# Patient Record
Sex: Female | Born: 1935 | Race: White | Hispanic: No | Smoking: Former smoker
Health system: Southern US, Community
[De-identification: ages and names within clinical notes are randomized; demographics above are authoritative.]

## PROBLEM LIST (undated history)

## (undated) DIAGNOSIS — D649 Anemia, unspecified: Secondary | ICD-10-CM

## (undated) DIAGNOSIS — I509 Heart failure, unspecified: Secondary | ICD-10-CM

## (undated) DIAGNOSIS — M8580 Other specified disorders of bone density and structure, unspecified site: Secondary | ICD-10-CM

## (undated) DIAGNOSIS — C50919 Malignant neoplasm of unspecified site of unspecified female breast: Secondary | ICD-10-CM

## (undated) DIAGNOSIS — K279 Peptic ulcer, site unspecified, unspecified as acute or chronic, without hemorrhage or perforation: Secondary | ICD-10-CM

## (undated) DIAGNOSIS — N182 Chronic kidney disease, stage 2 (mild): Secondary | ICD-10-CM

## (undated) DIAGNOSIS — K219 Gastro-esophageal reflux disease without esophagitis: Secondary | ICD-10-CM

## (undated) DIAGNOSIS — H409 Unspecified glaucoma: Secondary | ICD-10-CM

## (undated) DIAGNOSIS — C649 Malignant neoplasm of unspecified kidney, except renal pelvis: Secondary | ICD-10-CM

## (undated) DIAGNOSIS — E871 Hypo-osmolality and hyponatremia: Secondary | ICD-10-CM

## (undated) DIAGNOSIS — I10 Essential (primary) hypertension: Secondary | ICD-10-CM

## (undated) DIAGNOSIS — M1990 Unspecified osteoarthritis, unspecified site: Secondary | ICD-10-CM

## (undated) DIAGNOSIS — Z905 Acquired absence of kidney: Secondary | ICD-10-CM

## (undated) HISTORY — PX: HEMIARTHROPLASTY SHOULDER FRACTURE: SUR653

## (undated) HISTORY — PX: BREAST SURGERY: SHX581

## (undated) HISTORY — DX: Heart failure, unspecified: I50.9

## (undated) HISTORY — PX: COLONOSCOPY: SHX174

## (undated) HISTORY — PX: HIP SURGERY: SHX245

## (undated) HISTORY — PX: NEPHRECTOMY: SHX65

---

## 2004-10-23 ENCOUNTER — Ambulatory Visit: Payer: Self-pay | Admitting: Internal Medicine

## 2004-12-27 ENCOUNTER — Ambulatory Visit: Payer: Self-pay | Admitting: Urology

## 2006-01-21 ENCOUNTER — Ambulatory Visit: Payer: Self-pay | Admitting: Internal Medicine

## 2006-07-22 ENCOUNTER — Inpatient Hospital Stay: Payer: Self-pay | Admitting: Internal Medicine

## 2007-01-24 ENCOUNTER — Ambulatory Visit: Payer: Self-pay | Admitting: Internal Medicine

## 2007-06-16 ENCOUNTER — Ambulatory Visit: Payer: Self-pay | Admitting: Urology

## 2008-02-27 ENCOUNTER — Ambulatory Visit: Payer: Self-pay | Admitting: Internal Medicine

## 2008-06-08 ENCOUNTER — Ambulatory Visit: Payer: Self-pay | Admitting: Urology

## 2008-08-23 ENCOUNTER — Ambulatory Visit: Payer: Self-pay | Admitting: Unknown Physician Specialty

## 2009-03-16 ENCOUNTER — Ambulatory Visit: Payer: Self-pay | Admitting: Internal Medicine

## 2010-03-21 ENCOUNTER — Ambulatory Visit: Payer: Self-pay | Admitting: Internal Medicine

## 2010-03-24 ENCOUNTER — Inpatient Hospital Stay: Payer: Self-pay | Admitting: Family Medicine

## 2010-07-16 ENCOUNTER — Observation Stay: Payer: Self-pay | Admitting: Internal Medicine

## 2011-03-28 ENCOUNTER — Ambulatory Visit: Payer: Self-pay | Admitting: Gastroenterology

## 2011-04-16 ENCOUNTER — Ambulatory Visit: Payer: Self-pay | Admitting: Family Medicine

## 2012-09-07 ENCOUNTER — Emergency Department: Payer: Self-pay | Admitting: Emergency Medicine

## 2012-09-07 LAB — BASIC METABOLIC PANEL
Anion Gap: 4 — ABNORMAL LOW (ref 7–16)
BUN: 14 mg/dL (ref 7–18)
Calcium, Total: 9.2 mg/dL (ref 8.5–10.1)
Chloride: 105 mmol/L (ref 98–107)
Co2: 32 mmol/L (ref 21–32)
Creatinine: 1.1 mg/dL (ref 0.60–1.30)
EGFR (African American): 55 — ABNORMAL LOW
EGFR (Non-African Amer.): 47 — ABNORMAL LOW
Glucose: 134 mg/dL — ABNORMAL HIGH (ref 65–99)
Osmolality: 284 (ref 275–301)
Potassium: 3.3 mmol/L — ABNORMAL LOW (ref 3.5–5.1)
Sodium: 141 mmol/L (ref 136–145)

## 2012-09-07 LAB — URINALYSIS, COMPLETE
Bilirubin,UR: NEGATIVE
Blood: NEGATIVE
Glucose,UR: NEGATIVE mg/dL (ref 0–75)
Ketone: NEGATIVE
Nitrite: NEGATIVE
Ph: 6 (ref 4.5–8.0)
Protein: NEGATIVE
RBC,UR: 1 /HPF (ref 0–5)
Specific Gravity: 1.004 (ref 1.003–1.030)
Squamous Epithelial: NONE SEEN
WBC UR: 3 /HPF (ref 0–5)

## 2012-09-07 LAB — CBC
HCT: 39.4 % (ref 35.0–47.0)
MCHC: 34.8 g/dL (ref 32.0–36.0)
MCV: 86 fL (ref 80–100)
RBC: 4.59 10*6/uL (ref 3.80–5.20)
RDW: 14 % (ref 11.5–14.5)
WBC: 7.2 10*3/uL (ref 3.6–11.0)

## 2012-09-08 LAB — CK TOTAL AND CKMB (NOT AT ARMC): CK, Total: 507 U/L — ABNORMAL HIGH (ref 21–215)

## 2013-03-19 ENCOUNTER — Ambulatory Visit: Payer: Self-pay | Admitting: Family Medicine

## 2013-04-06 ENCOUNTER — Ambulatory Visit: Payer: Self-pay | Admitting: Family Medicine

## 2014-04-05 ENCOUNTER — Ambulatory Visit: Payer: Self-pay | Admitting: Family Medicine

## 2014-07-08 ENCOUNTER — Emergency Department
Admission: EM | Admit: 2014-07-08 | Discharge: 2014-07-08 | Disposition: A | Discharge status: Home / Self Care | Payer: Medicare PPO | Attending: Emergency Medicine | Admitting: Emergency Medicine

## 2014-07-08 ENCOUNTER — Other Ambulatory Visit: Payer: Self-pay

## 2014-07-08 ENCOUNTER — Encounter: Payer: Self-pay | Admitting: Emergency Medicine

## 2014-07-08 ENCOUNTER — Emergency Department: Payer: Medicare PPO

## 2014-07-08 DIAGNOSIS — R197 Diarrhea, unspecified: Secondary | ICD-10-CM | POA: Insufficient documentation

## 2014-07-08 DIAGNOSIS — N3090 Cystitis, unspecified without hematuria: Secondary | ICD-10-CM | POA: Insufficient documentation

## 2014-07-08 DIAGNOSIS — I10 Essential (primary) hypertension: Secondary | ICD-10-CM | POA: Diagnosis not present

## 2014-07-08 DIAGNOSIS — Z87891 Personal history of nicotine dependence: Secondary | ICD-10-CM | POA: Diagnosis not present

## 2014-07-08 DIAGNOSIS — R109 Unspecified abdominal pain: Secondary | ICD-10-CM | POA: Diagnosis present

## 2014-07-08 HISTORY — DX: Essential (primary) hypertension: I10

## 2014-07-08 LAB — URINALYSIS COMPLETE WITH MICROSCOPIC (ARMC ONLY)
Bilirubin Urine: NEGATIVE
GLUCOSE, UA: NEGATIVE mg/dL
HGB URINE DIPSTICK: NEGATIVE
Ketones, ur: NEGATIVE mg/dL
Nitrite: POSITIVE — AB
PROTEIN: NEGATIVE mg/dL
Specific Gravity, Urine: 1.01 (ref 1.005–1.030)
pH: 5 (ref 5.0–8.0)

## 2014-07-08 LAB — CBC WITH DIFFERENTIAL/PLATELET
BASOS ABS: 0.1 10*3/uL (ref 0–0.1)
BASOS PCT: 1 %
Eosinophils Absolute: 0.1 10*3/uL (ref 0–0.7)
Eosinophils Relative: 1 %
HCT: 31.5 % — ABNORMAL LOW (ref 35.0–47.0)
HEMOGLOBIN: 10.3 g/dL — AB (ref 12.0–16.0)
Lymphocytes Relative: 16 %
Lymphs Abs: 1.1 10*3/uL (ref 1.0–3.6)
MCH: 29.7 pg (ref 26.0–34.0)
MCHC: 32.7 g/dL (ref 32.0–36.0)
MCV: 90.6 fL (ref 80.0–100.0)
MONO ABS: 0.6 10*3/uL (ref 0.2–0.9)
Monocytes Relative: 8 %
Neutro Abs: 5.2 10*3/uL (ref 1.4–6.5)
Neutrophils Relative %: 74 %
PLATELETS: 198 10*3/uL (ref 150–440)
RBC: 3.47 MIL/uL — ABNORMAL LOW (ref 3.80–5.20)
RDW: 14.8 % — ABNORMAL HIGH (ref 11.5–14.5)
WBC: 7 10*3/uL (ref 3.6–11.0)

## 2014-07-08 LAB — LIPASE, BLOOD: Lipase: 41 U/L (ref 22–51)

## 2014-07-08 LAB — COMPREHENSIVE METABOLIC PANEL
ALK PHOS: 82 U/L (ref 38–126)
ALT: 11 U/L — AB (ref 14–54)
AST: 24 U/L (ref 15–41)
Albumin: 3.5 g/dL (ref 3.5–5.0)
Anion gap: 7 (ref 5–15)
BUN: 29 mg/dL — ABNORMAL HIGH (ref 6–20)
CALCIUM: 9 mg/dL (ref 8.9–10.3)
CO2: 26 mmol/L (ref 22–32)
CREATININE: 1.49 mg/dL — AB (ref 0.44–1.00)
Chloride: 104 mmol/L (ref 101–111)
GFR calc Af Amer: 37 mL/min — ABNORMAL LOW (ref >60)
GFR, EST NON AFRICAN AMERICAN: 32 mL/min — AB (ref >60)
Glucose, Bld: 107 mg/dL — ABNORMAL HIGH (ref 65–99)
Potassium: 4.3 mmol/L (ref 3.5–5.1)
Sodium: 137 mmol/L (ref 135–145)
Total Bilirubin: 0.5 mg/dL (ref 0.3–1.2)
Total Protein: 6.7 g/dL (ref 6.5–8.1)

## 2014-07-08 LAB — TROPONIN I

## 2014-07-08 MED ORDER — SULFAMETHOXAZOLE-TRIMETHOPRIM 800-160 MG PO TABS: 1.0000 | ORAL_TABLET | Freq: Two times a day (BID) | ORAL | Status: DC

## 2014-07-08 MED ORDER — CEFTRIAXONE SODIUM IN DEXTROSE 20 MG/ML IV SOLN
INTRAVENOUS | Status: AC
  Filled 2014-07-08: qty 50

## 2014-07-08 MED ORDER — CEFTRIAXONE SODIUM IN DEXTROSE 20 MG/ML IV SOLN
1.0000 g | Freq: Once | INTRAVENOUS | Status: AC
  Administered 2014-07-08: 1 g via INTRAVENOUS

## 2014-07-08 MED ORDER — ACETAMINOPHEN 500 MG PO TABS
ORAL_TABLET | ORAL | Status: AC
  Filled 2014-07-08: qty 2

## 2014-07-08 MED ORDER — ACETAMINOPHEN 500 MG PO TABS
1000.0000 mg | ORAL_TABLET | Freq: Once | ORAL | Status: AC
  Administered 2014-07-08: 1000 mg via ORAL

## 2014-07-08 NOTE — ED Provider Notes (Signed)
HISTORY  Chief Complaint Abdominal Pain    HPI Margaret Matthews is a 81 y.o. female who presents ER with abdominal pain that started last night. She describes the pain as a "deep pain". Patient states she's not had a history of pain like this before. She does have chronic diarrhea and some abdominal pain but this is worse than her normal. Did start last night to the point where she couldn't really sleep. Currently pain is 7/10, nothing makes it better or worse.   Past Medical History  Diagnosis Date  . Hypertension   . Renal disorder     There are no active problems to display for this patient.   Past Surgical History  Procedure Laterality Date  . Breast surgery      No current outpatient prescriptions on file.  Allergies Review of patient's allergies indicates no known allergies.  No family history on file.  Social History History  Substance Use Topics  . Smoking status: Former Research scientist (life sciences)  . Smokeless tobacco: Not on file  . Alcohol Use: No    Review of Systems Constitutional: Negative for fever. Eyes: Negative for visual changes. ENT: Negative for sore throat. Cardiovascular: Negative for chest pain. Respiratory: Negative for shortness of breath. Gastrointestinal: Positive for abdominal pain and diarrhea Genitourinary: Negative for dysuria. Musculoskeletal: Negative for back pain. Skin: Negative for rash. Neurological: Negative for headaches, focal weakness or numbness.  10-point ROS otherwise negative.  ____________________________________________   PHYSICAL EXAM:  VITAL SIGNS: ED Triage Vitals  Enc Vitals Group     BP 07/08/14 0715 205/81 mmHg     Pulse Rate 07/08/14 0715 72     Resp 07/08/14 0715 18     Temp 07/08/14 0715 97.4 F (36.3 C)     Temp Source 07/08/14 0715 Oral     SpO2 07/08/14 0715 100 %     Weight 07/08/14 0715 109 lb (49.442 kg)     Height 07/08/14 0715 5\' 2"  (1.575 m)     Head Cir --      Peak Flow --      Pain Score  07/08/14 0717 7     Pain Loc --      Pain Edu? --      Excl. in [place]? --     Constitutional: Alert and oriented. Well appearing and in no distress. Eyes: Conjunctivae are normal. PERRL. Normal extraocular movements. ENT   Head: Normocephalic and atraumatic.   Nose: No congestion/rhinnorhea.   Mouth/Throat: Mucous membranes are moist.   Neck: No stridor. Hematological/Lymphatic/Immunilogical: No cervical lymphadenopathy. Cardiovascular: Normal rate, regular rhythm. Normal and symmetric distal pulses are present in all extremities. No murmurs, rubs, or gallops. Respiratory: Normal respiratory effort without tachypnea nor retractions. Breath sounds are clear and equal bilaterally. No wheezes/rales/rhonchi. Gastrointestinal: Soft and nontender. Hyperactive bowel sounds. There is no CVA tenderness. Musculoskeletal: Nontender with normal range of motion in all extremities. No joint effusions.  No lower extremity tenderness nor edema. Neurologic:  Normal speech and language. No gross focal neurologic deficits are appreciated. Speech is normal. No gait instability. Skin:  Skin is warm, dry and intact. No rash noted. Psychiatric: Mood and affect are normal. Speech and behavior are normal. Patient exhibits appropriate insight and judgment.  ____________________________________________    LABS (pertinent positives/negatives)  Labs Reviewed  CBC WITH DIFFERENTIAL/PLATELET - Abnormal; Notable for the following:    RBC 3.47 (*)    Hemoglobin 10.3 (*)    HCT 31.5 (*)    RDW 14.8 (*)  All other components within normal limits  COMPREHENSIVE METABOLIC PANEL - Abnormal; Notable for the following:    Glucose, Bld 107 (*)    BUN 29 (*)    Creatinine, Ser 1.49 (*)    ALT 11 (*)    GFR calc non Af Amer 32 (*)    GFR calc Af Amer 37 (*)    All other components within normal limits  URINALYSIS COMPLETEWITH MICROSCOPIC ([facility] ONLY) - Abnormal; Notable for the following:    Color, Urine  YELLOW (*)    APPearance HAZY (*)    Nitrite POSITIVE (*)    Leukocytes, UA 3+ (*)    Bacteria, UA MANY (*)    Squamous Epithelial / LPF 0-5 (*)    All other components within normal limits  LIPASE, BLOOD  TROPONIN I    EKG: Interpreted by me. Normal sinus rhythm, normal axis normal intervals, nonspecific ST and T-wave changes. No evidence of acute infarction.  ____________________________________________  ED COURSE:  Pertinent labs & imaging results that were available during my care of the patient were reviewed by me and considered in my medical decision making (see chart for details).  We'll check basic abdominal labs, urine and abdominal x-rays ____________________________________________   RADIOLOGY  Abdomen flat and erect  IMPRESSION: Nonobstructive bowel gas pattern.  Atherosclerosis.  Surgical changes of the abdomen. ____________________________________________    FINAL ASSESSMENT AND PLAN  Abdominal pain and cystitis  Plan: Patient was given IV Rocephin 1 g here to begin her UTI treatment. She is in no acute distress, has a benign abdominal examination. We'll continue as an outpatient with Bactrim DS. Stable for outpatient follow-up with her doctor.    Earleen Newport, MD   Earleen Newport, MD 07/08/14 [phone]

## 2014-07-08 NOTE — ED Notes (Signed)
Pt with abd pain started last night. Pt with chronic diarrhea and abd pain, but worse last night.

## 2014-07-08 NOTE — Discharge Instructions (Signed)

## 2014-07-20 ENCOUNTER — Observation Stay
Admission: EM | Admit: 2014-07-20 | Discharge: 2014-07-23 | Disposition: A | Discharge status: Home / Self Care | Payer: Medicare PPO | Attending: Internal Medicine | Admitting: Internal Medicine

## 2014-07-20 ENCOUNTER — Encounter: Payer: Self-pay | Admitting: Emergency Medicine

## 2014-07-20 DIAGNOSIS — R197 Diarrhea, unspecified: Secondary | ICD-10-CM | POA: Insufficient documentation

## 2014-07-20 DIAGNOSIS — M8580 Other specified disorders of bone density and structure, unspecified site: Secondary | ICD-10-CM | POA: Insufficient documentation

## 2014-07-20 DIAGNOSIS — K219 Gastro-esophageal reflux disease without esophagitis: Secondary | ICD-10-CM | POA: Insufficient documentation

## 2014-07-20 DIAGNOSIS — R55 Syncope and collapse: Secondary | ICD-10-CM | POA: Diagnosis not present

## 2014-07-20 DIAGNOSIS — Z7982 Long term (current) use of aspirin: Secondary | ICD-10-CM | POA: Insufficient documentation

## 2014-07-20 DIAGNOSIS — Z905 Acquired absence of kidney: Secondary | ICD-10-CM | POA: Diagnosis not present

## 2014-07-20 DIAGNOSIS — E876 Hypokalemia: Secondary | ICD-10-CM | POA: Diagnosis not present

## 2014-07-20 DIAGNOSIS — M1990 Unspecified osteoarthritis, unspecified site: Secondary | ICD-10-CM | POA: Insufficient documentation

## 2014-07-20 DIAGNOSIS — N179 Acute kidney failure, unspecified: Secondary | ICD-10-CM | POA: Diagnosis present

## 2014-07-20 DIAGNOSIS — Z888 Allergy status to other drugs, medicaments and biological substances status: Secondary | ICD-10-CM | POA: Insufficient documentation

## 2014-07-20 DIAGNOSIS — Z853 Personal history of malignant neoplasm of breast: Secondary | ICD-10-CM | POA: Diagnosis not present

## 2014-07-20 DIAGNOSIS — D638 Anemia in other chronic diseases classified elsewhere: Secondary | ICD-10-CM | POA: Diagnosis not present

## 2014-07-20 DIAGNOSIS — Z8711 Personal history of peptic ulcer disease: Secondary | ICD-10-CM | POA: Insufficient documentation

## 2014-07-20 DIAGNOSIS — H409 Unspecified glaucoma: Secondary | ICD-10-CM | POA: Insufficient documentation

## 2014-07-20 DIAGNOSIS — N183 Chronic kidney disease, stage 3 (moderate): Secondary | ICD-10-CM | POA: Diagnosis not present

## 2014-07-20 DIAGNOSIS — Z23 Encounter for immunization: Secondary | ICD-10-CM | POA: Diagnosis not present

## 2014-07-20 DIAGNOSIS — Z8249 Family history of ischemic heart disease and other diseases of the circulatory system: Secondary | ICD-10-CM | POA: Insufficient documentation

## 2014-07-20 DIAGNOSIS — I129 Hypertensive chronic kidney disease with stage 1 through stage 4 chronic kidney disease, or unspecified chronic kidney disease: Principal | ICD-10-CM | POA: Insufficient documentation

## 2014-07-20 DIAGNOSIS — Z85528 Personal history of other malignant neoplasm of kidney: Secondary | ICD-10-CM | POA: Insufficient documentation

## 2014-07-20 DIAGNOSIS — H9190 Unspecified hearing loss, unspecified ear: Secondary | ICD-10-CM | POA: Insufficient documentation

## 2014-07-20 DIAGNOSIS — Z79899 Other long term (current) drug therapy: Secondary | ICD-10-CM | POA: Diagnosis not present

## 2014-07-20 DIAGNOSIS — E871 Hypo-osmolality and hyponatremia: Secondary | ICD-10-CM | POA: Diagnosis not present

## 2014-07-20 DIAGNOSIS — E785 Hyperlipidemia, unspecified: Secondary | ICD-10-CM | POA: Insufficient documentation

## 2014-07-20 DIAGNOSIS — Z87891 Personal history of nicotine dependence: Secondary | ICD-10-CM | POA: Diagnosis not present

## 2014-07-20 DIAGNOSIS — R112 Nausea with vomiting, unspecified: Secondary | ICD-10-CM | POA: Insufficient documentation

## 2014-07-20 HISTORY — DX: Unspecified glaucoma: H40.9

## 2014-07-20 HISTORY — DX: Peptic ulcer, site unspecified, unspecified as acute or chronic, without hemorrhage or perforation: K27.9

## 2014-07-20 HISTORY — DX: Chronic kidney disease, stage 2 (mild): N18.2

## 2014-07-20 HISTORY — DX: Unspecified osteoarthritis, unspecified site: M19.90

## 2014-07-20 HISTORY — DX: Gastro-esophageal reflux disease without esophagitis: K21.9

## 2014-07-20 HISTORY — DX: Malignant neoplasm of unspecified kidney, except renal pelvis: C64.9

## 2014-07-20 HISTORY — DX: Malignant neoplasm of unspecified site of unspecified female breast: C50.919

## 2014-07-20 HISTORY — DX: Anemia, unspecified: D64.9

## 2014-07-20 HISTORY — DX: Other specified disorders of bone density and structure, unspecified site: M85.80

## 2014-07-20 LAB — CBC
HEMATOCRIT: 30.5 % — AB (ref 35.0–47.0)
Hemoglobin: 10 g/dL — ABNORMAL LOW (ref 12.0–16.0)
MCH: 29.3 pg (ref 26.0–34.0)
MCHC: 32.8 g/dL (ref 32.0–36.0)
MCV: 89.4 fL (ref 80.0–100.0)
Platelets: 224 10*3/uL (ref 150–440)
RBC: 3.41 MIL/uL — AB (ref 3.80–5.20)
RDW: 14.6 % — ABNORMAL HIGH (ref 11.5–14.5)
WBC: 17.3 10*3/uL — AB (ref 3.6–11.0)

## 2014-07-20 LAB — BASIC METABOLIC PANEL
Anion gap: 9 (ref 5–15)
BUN: 40 mg/dL — AB (ref 6–20)
CALCIUM: 8.7 mg/dL — AB (ref 8.9–10.3)
CO2: 21 mmol/L — AB (ref 22–32)
CREATININE: 2.25 mg/dL — AB (ref 0.44–1.00)
Chloride: 95 mmol/L — ABNORMAL LOW (ref 101–111)
GFR calc Af Amer: 22 mL/min — ABNORMAL LOW (ref >60)
GFR calc non Af Amer: 19 mL/min — ABNORMAL LOW (ref >60)
GLUCOSE: 265 mg/dL — AB (ref 65–99)
Potassium: 4.6 mmol/L (ref 3.5–5.1)
Sodium: 125 mmol/L — ABNORMAL LOW (ref 135–145)

## 2014-07-20 LAB — URINALYSIS COMPLETE WITH MICROSCOPIC (ARMC ONLY)
BILIRUBIN URINE: NEGATIVE
Glucose, UA: NEGATIVE mg/dL
HGB URINE DIPSTICK: NEGATIVE
Ketones, ur: NEGATIVE mg/dL
NITRITE: NEGATIVE
Protein, ur: NEGATIVE mg/dL
Specific Gravity, Urine: 1.013 (ref 1.005–1.030)
pH: 5 (ref 5.0–8.0)

## 2014-07-20 LAB — TROPONIN I: Troponin I: <0.03 ng/mL (ref <0.031)

## 2014-07-20 MED ORDER — DORZOLAMIDE HCL 2 % OP SOLN
1.0000 [drp] | Freq: Every day | OPHTHALMIC | Status: DC
  Administered 2014-07-21 – 2014-07-23 (×3): 1 [drp] via OPHTHALMIC
  Filled 2014-07-20: qty 10

## 2014-07-20 MED ORDER — FERROUS SULFATE 325 (65 FE) MG PO TABS
325.0000 mg | ORAL_TABLET | Freq: Every evening | ORAL | Status: DC
  Administered 2014-07-22 – 2014-07-23 (×2): 325 mg via ORAL
  Filled 2014-07-20 (×2): qty 1

## 2014-07-20 MED ORDER — SODIUM CHLORIDE 0.9 % IV SOLN
INTRAVENOUS | Status: DC
  Administered 2014-07-20 – 2014-07-23 (×5): via INTRAVENOUS

## 2014-07-20 MED ORDER — METRONIDAZOLE IN NACL 5-0.79 MG/ML-% IV SOLN
500.0000 mg | Freq: Three times a day (TID) | INTRAVENOUS | Status: DC
  Administered 2014-07-20 – 2014-07-21 (×2): 500 mg via INTRAVENOUS
  Filled 2014-07-20 (×5): qty 100

## 2014-07-20 MED ORDER — ASPIRIN EC 81 MG PO TBEC
81.0000 mg | DELAYED_RELEASE_TABLET | Freq: Every day | ORAL | Status: DC
  Administered 2014-07-21 – 2014-07-23 (×3): 81 mg via ORAL
  Filled 2014-07-20 (×3): qty 1

## 2014-07-20 MED ORDER — VITAMIN C 500 MG PO TABS
250.0000 mg | ORAL_TABLET | Freq: Every evening | ORAL | Status: DC
  Administered 2014-07-22 – 2014-07-23 (×2): 250 mg via ORAL
  Filled 2014-07-20 (×3): qty 1

## 2014-07-20 MED ORDER — METRONIDAZOLE IN NACL 5-0.79 MG/ML-% IV SOLN
INTRAVENOUS | Status: AC
  Filled 2014-07-20: qty 100

## 2014-07-20 MED ORDER — HYDROXYCHLOROQUINE SULFATE 200 MG PO TABS
200.0000 mg | ORAL_TABLET | Freq: Every day | ORAL | Status: DC
  Administered 2014-07-21 – 2014-07-23 (×3): 200 mg via ORAL
  Filled 2014-07-20 (×3): qty 1

## 2014-07-20 MED ORDER — SODIUM CHLORIDE 0.9 % IV BOLUS (SEPSIS)
1000.0000 mL | Freq: Once | INTRAVENOUS | Status: AC
Start: 2014-07-20 — End: 2014-07-20
  Administered 2014-07-20: 1000 mL via INTRAVENOUS

## 2014-07-20 MED ORDER — PANTOPRAZOLE SODIUM 40 MG PO TBEC
40.0000 mg | DELAYED_RELEASE_TABLET | Freq: Every day | ORAL | Status: DC
  Administered 2014-07-21 – 2014-07-23 (×3): 40 mg via ORAL
  Filled 2014-07-20 (×3): qty 1

## 2014-07-20 MED ORDER — LATANOPROST 0.005 % OP SOLN
1.0000 [drp] | Freq: Every day | OPHTHALMIC | Status: DC
  Administered 2014-07-21 – 2014-07-22 (×3): 1 [drp] via OPHTHALMIC
  Filled 2014-07-20: qty 2.5

## 2014-07-20 MED ORDER — CALCIUM CARBONATE-VITAMIN D 500-200 MG-UNIT PO TABS
1.0000 | ORAL_TABLET | Freq: Two times a day (BID) | ORAL | Status: DC
  Administered 2014-07-21 – 2014-07-23 (×5): 1 via ORAL
  Filled 2014-07-20 (×8): qty 1

## 2014-07-20 MED ORDER — RISAQUAD PO CAPS
1.0000 | ORAL_CAPSULE | Freq: Two times a day (BID) | ORAL | Status: DC
  Administered 2014-07-21 – 2014-07-23 (×6): 1 via ORAL
  Filled 2014-07-20 (×9): qty 1

## 2014-07-20 MED ORDER — PNEUMOCOCCAL VAC POLYVALENT 25 MCG/0.5ML IJ INJ
0.5000 mL | INJECTION | INTRAMUSCULAR | Status: AC
  Administered 2014-07-21: 0.5 mL via INTRAMUSCULAR
  Filled 2014-07-20: qty 0.5

## 2014-07-20 MED ORDER — PRAVASTATIN SODIUM 20 MG PO TABS
20.0000 mg | ORAL_TABLET | Freq: Every evening | ORAL | Status: DC
  Administered 2014-07-21 – 2014-07-22 (×2): 20 mg via ORAL
  Filled 2014-07-20 (×2): qty 1

## 2014-07-20 MED ORDER — OCUVITE PO TABS
1.0000 | ORAL_TABLET | Freq: Every evening | ORAL | Status: DC
  Administered 2014-07-22 – 2014-07-23 (×2): 1 via ORAL
  Filled 2014-07-20 (×4): qty 1

## 2014-07-20 MED ORDER — IRON-VITAMIN C 100-250 MG PO TABS: 1.0000 | ORAL_TABLET | Freq: Every evening | ORAL | Status: DC

## 2014-07-20 MED ORDER — SODIUM CHLORIDE 0.9 % IV SOLN: Freq: Once | INTRAVENOUS | Status: DC

## 2014-07-20 MED ORDER — HEPARIN SODIUM (PORCINE) 5000 UNIT/ML IJ SOLN
5000.0000 [IU] | Freq: Three times a day (TID) | INTRAMUSCULAR | Status: DC
  Administered 2014-07-21 – 2014-07-23 (×8): 5000 [IU] via SUBCUTANEOUS
  Filled 2014-07-20 (×8): qty 1

## 2014-07-20 MED ORDER — CALCIUM 600+D PLUS MINERALS 600-400 MG-UNIT PO TABS: 1.0000 | ORAL_TABLET | Freq: Two times a day (BID) | ORAL | Status: DC

## 2014-07-20 NOTE — ED Notes (Signed)
Pt presents with son to ed with c/o diarrhea and near syncopal episode. Pt was seen here 12 days ago and dx with bladder infection, given sulfa and states the sulfa gave her diarrhea.

## 2014-07-20 NOTE — ED Notes (Addendum)
Pt states that she had bladder infection and was put on sulfa antibiotic for it and began to have diarrhea on June 4. States that she has had lots of diarrhea, continuously since then. Pt states that she has a lot of "stomach issues" and that she has had colitis. Pt states she passed out today and her son caught her so she did not hit the floor. Son states that she has a "tendency to faint" as well as ongoing digestive issues. Pt concerned that she has not been able to shake the diarrhea since it started. Pt also states that she saw her MD yesterday, who did urinalysis and stated that she could get off the abx because she showed no sign of infection.

## 2014-07-20 NOTE — H&P (Signed)
[facility] NAME: Margaret Matthews    MR#:  [id number]  DATE OF BIRTH:  Sep 05, 1930  DATE OF ADMISSION:  07/20/2014  PRIMARY CARE PHYSICIAN: Dion Body, MD   REQUESTING/REFERRING PHYSICIAN: Dr. Archie Balboa  CHIEF COMPLAINT:   Chief Complaint  Patient presents with  . Near Syncope  . Diarrhea    HISTORY OF PRESENT ILLNESS:  Margaret Matthews  is a 81 y.o. female with a known history of hypertension, hyperlipidemia, very hard of hearing, CK D stage III with baseline creatinine of 1.5 secondary to right nephrectomy for renal cell carcinoma, anemia of chronic disease who has occasional diarrhea at baseline presents to hospital secondary to worsening of her diarrhea since last night. Patient had urinary tract infection about 10 days ago and was treated with Bactrim. She just finished her antibiotics couple of days ago. A few days into starting Bactrim she started to have diarrhea, no fevers or chills. No abdominal pain but does have cramping prior to each bowel movement. She does complain of bloating had an episode of nausea and vomiting last night. Her diarrhea got worse that she had to use the bathroom 10 times overnight and so presented to the ER. Stool for C. difficile is pending at this time. Also labs indicate acute renal failure with elevated BUN and creatinine.  PAST MEDICAL HISTORY:   Past Medical History  Diagnosis Date  . Hypertension   . Renal disorder   . Arthritis   . CKD (chronic kidney disease) stage 2, GFR 60-89 ml/min     baseline creatinine 1.5  . Renal cell carcinoma   . Breast cancer   . Anemia   . Arthritis   . Osteopenia   . GERD (gastroesophageal reflux disease)   . PUD (peptic ulcer disease)   . Glaucoma     PAST SURGICAL HISTORY:   Past Surgical History  Procedure Laterality Date  . Breast surgery      Left mastectomy  . Nephrectomy      Right nephrectomy  . Hemiarthroplasty shoulder  fracture      Right shoulder  . Hip surgery      Left hip  . Colonoscopy      SOCIAL HISTORY:   History  Substance Use Topics  . Smoking status: Former Research scientist (life sciences)  . Smokeless tobacco: Not on file  . Alcohol Use: No    FAMILY HISTORY:   Family History  Problem Relation Age of Onset  . CAD Mother   . CVA Father   . CAD Sister     DRUG ALLERGIES:   Allergies  Allergen Reactions  . Statins Other (See Comments)    Reaction: Pt says "it was showing up in her liver."     REVIEW OF SYSTEMS:   Review of Systems  Constitutional: Negative for fever, chills, weight loss and malaise/fatigue.  HENT: Positive for hearing loss. Negative for ear discharge, ear pain, nosebleeds and tinnitus.        Severe hearing loss  Eyes: Negative for blurred vision, double vision and photophobia.  Respiratory: Negative for cough, hemoptysis, shortness of breath and wheezing.   Cardiovascular: Negative for chest pain, palpitations, orthopnea and leg swelling.  Gastrointestinal: Positive for nausea, vomiting and diarrhea. Negative for heartburn, abdominal pain, constipation and melena.       Abdominal cramping prior to diarrhea episode  Genitourinary: Negative for dysuria, urgency, frequency and hematuria.  Musculoskeletal: Negative for myalgias, back pain and neck pain.  Skin: Negative for rash.  Neurological: Positive for dizziness. Negative for tingling, tremors, sensory change, speech change, focal weakness and headaches.  Endo/Heme/Allergies: Does not bruise/bleed easily.  Psychiatric/Behavioral: Negative for depression.    MEDICATIONS AT HOME:   Prior to Admission medications   Medication Sig Start Date End Date Taking? Authorizing Provider  aspirin EC 81 MG tablet Take 81 mg by mouth daily.   Yes Historical Provider, MD  beta carotene w/minerals (OCUVITE) tablet Take 1 tablet by mouth every evening.   Yes Historical Provider, MD  Calcium Carbonate-Vit D-Min (CALCIUM 600+D PLUS MINERALS  PO) Take 1 tablet by mouth 2 (two) times daily.    Yes Historical Provider, MD  carvedilol (COREG) 3.125 MG tablet Take 6.25 mg by mouth 2 (two) times daily with a meal.   Yes Historical Provider, MD  CRANBERRY PO Take 1 capsule by mouth every evening.   Yes Historical Provider, MD  dorzolamide (TRUSOPT) 2 % ophthalmic solution Place 1 drop into both eyes daily.   Yes Historical Provider, MD  enalapril (VASOTEC) 20 MG tablet Take 40 mg by mouth every evening.   Yes Historical Provider, MD  hydrochlorothiazide (MICROZIDE) 12.5 MG capsule Take 25 mg by mouth daily.   Yes Historical Provider, MD  hydroxychloroquine (PLAQUENIL) 200 MG tablet Take 200 mg by mouth daily.   Yes Historical Provider, MD  IRON-VITAMIN C PO Take 1 tablet by mouth every evening.   Yes Historical Provider, MD  latanoprost (XALATAN) 0.005 % ophthalmic solution Place 1 drop into both eyes at bedtime.   Yes Historical Provider, MD  Loperamide HCl (IMODIUM PO) Take 1 tablet by mouth daily as needed (for diarrhea).    Yes Historical Provider, MD  pantoprazole (PROTONIX) 40 MG tablet Take 40 mg by mouth daily.   Yes Historical Provider, MD  pravastatin (PRAVACHOL) 20 MG tablet Take 20 mg by mouth every evening.   Yes Historical Provider, MD  sulfamethoxazole-trimethoprim (BACTRIM DS) 800-160 MG per tablet Take 1 tablet by mouth 2 (two) times daily. 07/08/14   Earleen Newport, MD      VITAL SIGNS:  Blood pressure 128/50, pulse 66, temperature 98.9 F (37.2 C), temperature source Oral, resp. rate 16, height 5\' 2"  (1.575 m), weight 49.442 kg (109 lb), SpO2 97 %.  PHYSICAL EXAMINATION:   Physical Exam  GENERAL:  81 y.o.-year-old patient lying in the bed with no acute distress.  EYES: Pupils equal, round, reactive to light and accommodation. No scleral icterus. Extraocular muscles intact.  HEENT: Head atraumatic, normocephalic. Oropharynx and nasopharynx clear. Significant hearing loss. NECK:  Supple, no jugular venous  distention. No thyroid enlargement, no tenderness.  LUNGS: Normal breath sounds bilaterally, no wheezing, rales,rhonchi or crepitation. No use of accessory muscles of respiration.  CARDIOVASCULAR: S1, S2 normal. 3/6 systolic murmur present, rubs, or gallops.  ABDOMEN: Soft, nontender, nondistended. Bowel sounds present. No organomegaly or mass.  EXTREMITIES: No pedal edema, cyanosis, or clubbing.  NEUROLOGIC: Cranial nerves II through XII are intact. Muscle strength 5/5 in all extremities. Sensation intact. Gait not checked.  PSYCHIATRIC: The patient is alert and oriented x 3.  SKIN: No obvious rash, lesion, or ulcer.   LABORATORY PANEL:   CBC  Recent Labs Lab 07/20/14 1845  WBC 17.3*  HGB 10.0*  HCT 30.5*  PLT 224   ------------------------------------------------------------------------------------------------------------------  Chemistries   Recent Labs Lab 07/20/14 1845  NA 125*  K 4.6  CL 95*  CO2 21*  GLUCOSE 265*  BUN 40*  CREATININE 2.25*  CALCIUM 8.7*   ------------------------------------------------------------------------------------------------------------------  Cardiac Enzymes  Recent Labs Lab 07/20/14 1845  TROPONINI <0.03   ------------------------------------------------------------------------------------------------------------------  RADIOLOGY:  No results found.  EKG:   Orders placed or performed during the hospital encounter of 07/20/14  . ED EKG  . ED EKG    IMPRESSION AND PLAN:   Margaret Matthews  is a 81 y.o. female with a known history of hypertension, hyperlipidemia, very hard of hearing, CK D stage III with baseline creatinine of 1.5 secondary to right nephrectomy for renal cell carcinoma, anemia of chronic disease admitted for acute diarrhea and acute renal failure.  #1 acute diarrhea-known history of occasional episodes of diarrhea with worsening since Bactrim last week. -Stool for besides, cultures and C. difficile have been  ordered. -Start empirically on Flagyl and contact precautions. Especially since her white count is also elevated. -IV fluids, nausea medication -Advance diet as tolerated. Probiotics have been added as well.  #2 acute on chronic kidney disease-known history of CK D stage III with baseline creatinine of 1.5 -Now acute insufficiency likely due to prerenal causes -Hold blood pressure medicines as blood pressure is low normal -IV fluids and recheck creatinine in a.m. -If further worsening, then we will order renal ultrasound at that time.  #3 hypertension-hold her blood pressure medicines as blood pressure is now low normal. - Continue IV fluids  #4 glaucoma-continue eyedrops  #5 hyponatremia-likely hypovolemic, continue IV fluids  #6 DVT prophylaxis-subcutaneous heparin   All the records are reviewed and case discussed with ED provider. Management plans discussed with the patient, family and they are in agreement.  CODE STATUS: Full Code  TOTAL TIME TAKING CARE OF THIS PATIENT: 50 minutes.    Gladstone Lighter M.D on 07/20/2014 at 8:58 PM  Between 7am to 6pm - Pager - [phone]  After 6pm go to [email or web address] - password [email or web address]  Office  [phone]  CC: Primary care physician; Dion Body, MD

## 2014-07-20 NOTE — ED Provider Notes (Signed)
[facility] Emergency Department Provider Note   ____________________________________________  Time seen: 1930  I have reviewed the triage vital signs and the nursing notes.   HISTORY  Chief Complaint Near Syncope and Diarrhea   History limited by: Not Limited   HPI ROSALIN BUSTER is a 81 y.o. female presents to the emergency department because of concerns of persistent diarrhea. The patient was seen in the emergency department 12 days ago and diagnosed with a UTI. Put on Bactrim. She states that her abdominal pain has gotten better after being started on Bactrim however she did develop diarrhea. She states that she had over 10 episodes last night and today. She describes it as watery. She denies any concurrent vomiting or abdominal pain. Denies any fevers.     Past Medical History  Diagnosis Date  . Hypertension   . Renal disorder   . Arthritis     There are no active problems to display for this patient.   Past Surgical History  Procedure Laterality Date  . Breast surgery      Current Outpatient Rx  Name  Route  Sig  Dispense  Refill  . aspirin EC 81 MG tablet   Oral   Take 81 mg by mouth daily.         . beta carotene w/minerals (OCUVITE) tablet   Oral   Take 1 tablet by mouth every evening.         . Calcium Carbonate-Vit D-Min (CALCIUM 600+D PLUS MINERALS PO)   Oral   Take 1 tablet by mouth 2 (two) times daily.          . carvedilol (COREG) 3.125 MG tablet   Oral   Take 6.25 mg by mouth 2 (two) times daily with a meal.         . CRANBERRY PO   Oral   Take 1 capsule by mouth every evening.         . dorzolamide (TRUSOPT) 2 % ophthalmic solution   Both Eyes   Place 1 drop into both eyes daily.         . enalapril (VASOTEC) 20 MG tablet   Oral   Take 40 mg by mouth every evening.         . hydrochlorothiazide (MICROZIDE) 12.5 MG capsule   Oral   Take 25 mg by mouth daily.         . hydroxychloroquine  (PLAQUENIL) 200 MG tablet   Oral   Take 200 mg by mouth daily.         Marland Kitchen IRON-VITAMIN C PO   Oral   Take 1 tablet by mouth every evening.         . latanoprost (XALATAN) 0.005 % ophthalmic solution   Both Eyes   Place 1 drop into both eyes at bedtime.         . Loperamide HCl (IMODIUM PO)   Oral   Take 1 tablet by mouth daily as needed (for diarrhea).          . pantoprazole (PROTONIX) 40 MG tablet   Oral   Take 40 mg by mouth daily.         . pravastatin (PRAVACHOL) 20 MG tablet   Oral   Take 20 mg by mouth every evening.         . sulfamethoxazole-trimethoprim (BACTRIM DS) 800-160 MG per tablet   Oral   Take 1 tablet by mouth 2 (two) times daily.   [place]ikes  tablet   0     Allergies Statins  No family history on file.  Social History History  Substance Use Topics  . Smoking status: Former Research scientist (life sciences)  . Smokeless tobacco: Not on file  . Alcohol Use: No    Review of Systems  Constitutional: Negative for fever. Cardiovascular: Negative for chest pain. Respiratory: Negative for shortness of breath. Gastrointestinal: Positive for diarrhea. Negative for abdominal pain, vomiting. Genitourinary: Negative for dysuria. Musculoskeletal: Negative for back pain. Skin: Negative for rash. Neurological: Negative for headaches, focal weakness or numbness.   10-point ROS otherwise negative.  ____________________________________________   PHYSICAL EXAM:  VITAL SIGNS: ED Triage Vitals  Enc Vitals Group     BP 07/20/14 1838 113/38 mmHg     Pulse Rate 07/20/14 1838 66     Resp 07/20/14 1838 20     Temp 07/20/14 1838 98.9 F (37.2 C)     Temp Source 07/20/14 1838 Oral     SpO2 07/20/14 1838 97 %     Weight 07/20/14 1838 109 lb (49.442 kg)     Height 07/20/14 1838 5\' 2"  (1.575 m)   Constitutional: Alert and oriented. Well appearing and in no distress. Eyes: Conjunctivae are normal. PERRL. Normal extraocular movements. ENT   Head: Normocephalic and  atraumatic.   Nose: No congestion/rhinnorhea.   Mouth/Throat: Mucous membranes are moist.   Neck: No stridor. Hematological/Lymphatic/Immunilogical: No cervical lymphadenopathy. Cardiovascular: Normal rate, regular rhythm.  No murmurs, rubs, or gallops. Respiratory: Normal respiratory effort without tachypnea nor retractions. Breath sounds are clear and equal bilaterally. No wheezes/rales/rhonchi. Gastrointestinal: Soft and nontender. No distention. There is no CVA tenderness. Genitourinary: Deferred Musculoskeletal: Normal range of motion in all extremities. No joint effusions.  No lower extremity tenderness nor edema. Neurologic:  Normal speech and language. No gross focal neurologic deficits are appreciated. Speech is normal.  Skin:  Skin is warm, dry and intact. No rash noted. Psychiatric: Mood and affect are normal. Speech and behavior are normal. Patient exhibits appropriate insight and judgment.  ____________________________________________    LABS (pertinent positives/negatives)  Labs Reviewed  CBC - Abnormal; Notable for the following:    WBC 17.3 (*)    RBC 3.41 (*)    Hemoglobin 10.0 (*)    HCT 30.5 (*)    RDW 14.6 (*)    All other components within normal limits  BASIC METABOLIC PANEL - Abnormal; Notable for the following:    Sodium 125 (*)    Chloride 95 (*)    CO2 21 (*)    Glucose, Bld 265 (*)    BUN 40 (*)    Creatinine, Ser 2.25 (*)    Calcium 8.7 (*)    GFR calc non Af Amer 19 (*)    GFR calc Af Amer 22 (*)    All other components within normal limits  C DIFFICILE QUICK SCAN W PCR REFLEX ([facility] ONLY)  TROPONIN I  URINALYSIS COMPLETEWITH MICROSCOPIC ([facility] ONLY)     ____________________________________________   EKG  I, Nance Pear, attending physician, personally viewed and interpreted this EKG  EKG Time: 1855 Rate: 61 Rhythm: NSR Axis: Normal Intervals: qtc 420 QRS: narrow, q waves V1, V2 ST changes: no st  elevation    ____________________________________________    RADIOLOGY  None  ____________________________________________   PROCEDURES  Procedure(s) performed: None  Critical Care performed: No  ____________________________________________   INITIAL IMPRESSION / ASSESSMENT AND PLAN / ED COURSE  Pertinent labs & imaging results that were available during my care of  the patient were reviewed by me and considered in my medical decision making (see chart for details).  Patient presents to the emergency department with diarrhea. Recently stopped a course of Bactrim for UTI. On exam patient awake, alert no acute distress. Both lower concerning for elevated creatinine, electrolyte imbalances consistent with dehydration. Even the patient only has one kidney I have concern for acute kidney injury. We will give fluids, send C. difficile and plan on admission to the hospital.  ____________________________________________   FINAL CLINICAL IMPRESSION(S) / ED DIAGNOSES  Diarrhea Acute Kidney Injury  Nance Pear, MD 07/20/14 2012

## 2014-07-21 ENCOUNTER — Encounter: Payer: Self-pay | Admitting: Gastroenterology

## 2014-07-21 LAB — BASIC METABOLIC PANEL
Anion gap: 6 (ref 5–15)
BUN: 39 mg/dL — ABNORMAL HIGH (ref 6–20)
CALCIUM: 8.4 mg/dL — AB (ref 8.9–10.3)
CHLORIDE: 105 mmol/L (ref 101–111)
CO2: 20 mmol/L — AB (ref 22–32)
Creatinine, Ser: 1.75 mg/dL — ABNORMAL HIGH (ref 0.44–1.00)
GFR calc Af Amer: 30 mL/min — ABNORMAL LOW (ref >60)
GFR calc non Af Amer: 26 mL/min — ABNORMAL LOW (ref >60)
Glucose, Bld: 99 mg/dL (ref 65–99)
POTASSIUM: 3.8 mmol/L (ref 3.5–5.1)
SODIUM: 131 mmol/L — AB (ref 135–145)

## 2014-07-21 LAB — C DIFFICILE QUICK SCREEN W PCR REFLEX
C DIFFICILE (CDIFF) INTERP: NEGATIVE
C DIFFICILE (CDIFF) TOXIN: NEGATIVE
C Diff antigen: NEGATIVE

## 2014-07-21 LAB — CBC
HEMATOCRIT: 26.6 % — AB (ref 35.0–47.0)
Hemoglobin: 8.8 g/dL — ABNORMAL LOW (ref 12.0–16.0)
MCH: 29.4 pg (ref 26.0–34.0)
MCHC: 33 g/dL (ref 32.0–36.0)
MCV: 89.3 fL (ref 80.0–100.0)
PLATELETS: 179 10*3/uL (ref 150–440)
RBC: 2.98 MIL/uL — ABNORMAL LOW (ref 3.80–5.20)
RDW: 14 % (ref 11.5–14.5)
WBC: 8.9 10*3/uL (ref 3.6–11.0)

## 2014-07-21 MED ORDER — METRONIDAZOLE 500 MG PO TABS
500.0000 mg | ORAL_TABLET | Freq: Three times a day (TID) | ORAL | Status: DC
  Administered 2014-07-21 – 2014-07-23 (×6): 500 mg via ORAL
  Filled 2014-07-21 (×6): qty 1

## 2014-07-21 NOTE — Progress Notes (Signed)
   07/21/14 0930  Clinical Encounter Type  Visited With Patient  Visit Type Initial  Referral From Nurse  Consult/Referral To Chaplain  Spiritual Encounters  Spiritual Needs Prayer  Provided pastoral support and prayer to patient. Had requested to see a chaplain.  Patient was calm and said she was pleased to see me.  Stated she "was ready to go if the [facility] called her, but wasn't ready to go today." Said she is feeling ok and told me that she is hoping to go home today.  I told her I would come back and visit with her later today or tomorrow if she was still here.  Patient thanked me very much for coming to see her.  [place]

## 2014-07-21 NOTE — Plan of Care (Signed)
Problem: Discharge Progression Outcomes Goal: Discharge plan in place and appropriate Individualization: Pt is very HOH.  Pt is a high fall risk. Offer toileting qx1hr with safety checks. Pt ambulates to the bathroom with a cane and 1xassist. H/O hyperlipidemia, glaucoma, GERD controlled with meds. H/O HTN- BP meds held. H/O CKD stage III, R nephrectomy for renal call carcinoma. Goal: Other Discharge Outcomes/Goals Plan of care progress to goals: VSS. Negative for C diff. Enteric precautions d/c. Denies pain/n/v.

## 2014-07-21 NOTE — Progress Notes (Signed)
Initial Nutrition Assessment  DOCUMENTATION CODES:     INTERVENTION: Meals and snacks: Cater to pt prefences Medical Nutrition Supplement: Pt does not tolerate milky supplements, may need to consider boost breeze if unable to meet nutritional needs on follow-up   NUTRITION DIAGNOSIS:  Inadequate oral intake related to altered GI function as evidenced by per patient/family report.   GOAL:  Patient will meet greater than or equal to 90% of their needs    MONITOR:   (Energy intake, Electrolyte and renal profile)  REASON FOR ASSESSMENT:   (diagnosis)    ASSESSMENT:  Pt admitted with ARF, syncope, diarrhea from sulfa drug per pt  PMHx:  Past Medical History  Diagnosis Date  . Hypertension   . Renal disorder   . Arthritis   . CKD (chronic kidney disease) stage 2, GFR 60-89 ml/min     baseline creatinine 1.5  . Renal cell carcinoma   . Breast cancer   . Anemia   . Arthritis   . Osteopenia   . GERD (gastroesophageal reflux disease)   . PUD (peptic ulcer disease)   . Glaucoma     Current Nutrition: ate eggs, bacon, juice and coffee for breakfast this am and tolerated well   Nutrition Prior to Admission Pt reports decreased intake prior to admission secondary to diarrhea but was trying to drink plenty of water and gatorade and continue to eat as much as she could   Labs:  Electrolyte and Renal Profile:  Recent Labs Lab 07/20/14 1845 07/21/14 0549  BUN 40* 39*  CREATININE 2.25* 1.75*  NA 125* 131*  K 4.6 3.8    Medications: NS at 65ml/hr, fesulfate, calcium and vit D   Physical  Findings:   Nutrition-Focused physical exam completed. Findings are no fat depletion, mild muscle depletion noted in clavicle region, dorsal hand and thigh area  muscle, and no edema.    Weight Change: pt reports stable wt.   Height:  Ht Readings from Last 1 Encounters:  07/20/14 5\' 2"  (1.575 m)    Weight:  Wt Readings from Last 1 Encounters:  07/20/14 109 lb  (49.442 kg)        Wt Readings from Last 10 Encounters:  07/20/14 109 lb (49.442 kg)  07/08/14 109 lb (49.442 kg)    BMI:  Body mass index is 19.93 kg/(m^2).  Estimated Nutritional Needs:  Kcal:  BEE 913 kcals (IF 1.1-1.3, AF 1.3) 0786-7544 kcals/d.   Protein:  (1.0-1.2 g/d) 50-60 g/d  Fluid:  (25-27ml/kg) 1250-1529ml/d   Diet Order:  Diet regular Room service appropriate?: Yes; Fluid consistency:: Thin  EDUCATION NEEDS: No education needs identified at this time   Intake/Output Summary (Last 24 hours) at 07/21/14 1418 Last data filed at 07/21/14 1200  Gross per 24 hour  Intake    120 ml  Output    300 ml  Net   -180 ml    Last BM:  6/15 (c-diff negative)  MODERATE Care Level Margaret Matthews, [place], [place] (pager)

## 2014-07-21 NOTE — Progress Notes (Signed)
   07/21/14 0600  Clinical Encounter Type  Visited With Patient  Visit Type Initial  Referral From Nurse  Consult/Referral To Chaplain  Spiritual Encounters  Spiritual Needs Other (Comment) (Order cancelled at patient's request)  Stress Factors  Patient Stress Factors None identified  Order cancelled at patient's request. [place]

## 2014-07-21 NOTE — Progress Notes (Signed)
[name], female   DOB: 15-Dec-1930, 81 y.o.   MRN: [id number] GI Inpatient Follow-up Note  Patient Identification: Margaret Matthews is a 81 y.o. female with diarrhea. Please see Margaret Matthews' notes.  Subjective: Pt with hx of chronic diarrhea in the past. Known hx of collagenous colitis, for which she used to be on balsalzide. Followed by Dr. Secundino Ginger, NP.Has been off this meds for at least few months due to being asymptomatic. Was on bactrim recently due to UTI. Diarrhea recurred. Fortunately, Margaretdiff neg.  Scheduled Inpatient Medications:  . acidophilus  1 capsule Oral BID  . aspirin EC  81 mg Oral Daily  . beta carotene w/minerals  1 tablet Oral QPM  . calcium-vitamin D  1 tablet Oral BID  . dorzolamide  1 drop Both Eyes Daily  . ferrous sulfate  325 mg Oral QPM   And  . vitamin C  250 mg Oral QPM  . heparin  5,000 Units Subcutaneous 3 times per day  . hydroxychloroquine  200 mg Oral Daily  . latanoprost  1 drop Both Eyes QHS  . metroNIDAZOLE  500 mg Oral 3 times per day  . pantoprazole  40 mg Oral Daily  . pravastatin  20 mg Oral QPM    Continuous Inpatient Infusions:   . sodium chloride 75 mL/hr at 07/21/14 1143    PRN Inpatient Medications:    Review of Systems: Constitutional: Weight is stable.  Eyes: No changes in vision. ENT: No oral lesions, sore throat.  GI: see HPI.  Heme/Lymph: No easy bruising.  CV: No chest pain.  GU: No hematuria.  Integumentary: No rashes.  Neuro: No headaches.  Psych: No depression/anxiety.  Endocrine: No heat/cold intolerance.  Allergic/Immunologic: No urticaria.  Resp: No cough, SOB.  Musculoskeletal: No joint swelling.    Physical Examination: BP 105/46 mmHg  Pulse 67  Temp(Src) 98 F (36.7 C) (Oral)  Resp 16  Ht 5\' 2"  (1.575 m)  Wt 49.442 kg (109 lb)  BMI 19.93 kg/m2  SpO2 100% Gen: NAD, alert and oriented x 4 HEENT: PEERLA, EOMI, Neck: supple, no JVD or thyromegaly Chest: CTA bilaterally, no  wheezes, crackles, or other adventitious sounds CV: RRR, no m/g/c/r Abd: soft, NT, ND, +BS in all four quadrants; no HSM, guarding, ridigity, or rebound tenderness Ext: no edema, well perfused with 2+ pulses, Skin: no rash or lesions noted Lymph: no LAD  Data: Lab Results  Component Value Date   WBC 8.9 07/21/2014   HGB 8.8* 07/21/2014   HCT 26.6* 07/21/2014   MCV 89.3 07/21/2014   PLT 179 07/21/2014    Recent Labs Lab 07/20/14 1845 07/21/14 0549  HGB 10.0* 8.8*   Lab Results  Component Value Date   NA 131* 07/21/2014   K 3.8 07/21/2014   CL 105 07/21/2014   CO2 20* 07/21/2014   BUN 39* 07/21/2014   CREATININE 1.75* 07/21/2014   Lab Results  Component Value Date   ALT 11* 07/08/2014   AST 24 07/08/2014   ALKPHOS 82 07/08/2014   BILITOT 0.5 07/08/2014   No results for input(s): APTT, INR, PTT in the last 168 hours. Assessment/Plan: Margaret Matthews is a 81 y.o. female with hx of diarrhea, exerbated by recent bactrim use.  Recommendations: Agree with flagyl. Since, urinalysis suggests persistent UTI, would add cipro to regimen. Since diarrhea could be flare of her colitis, would resume balsalazide.  Please call with questions or concerns.  Margaret Matthews, Margaret Dawn, MD

## 2014-07-21 NOTE — Consult Note (Signed)
GI Inpatient Consult Note  Reason for Consult: Diarrhea x 8 days   Attending Requesting Consult: Dr. Fritzi Mandes  History of Present Illness: Margaret Matthews is a 81 y.o. female who complains of diarrhea for the past 8 days.  She reports starting on Bactrim for an asymptomatic UTI 10 days ago. She began have 7-8 episodes of watery non bloody diarrhea a couple of days after starting the Bactrim.  She ws told she could stop the Bactrim after having completed 4-5 days of treatment.  She reports taking imodium without very much relief.  She reports she tried to drink and eat as much as possible so we wouldn't become dehydrated.  She reports she become lightheaded when going to use the commode and son caught her when she had LOC and son reports she did have some vomiting at that time.   She has been seen at [facility] by Dawson Bills,  NP .  Her last visit was 06/2013 and at that time Ms. Fauth was being consulted for an evaluation of colitis.  She had been taking Balsalazide 1 tab bid on and off for several years as well as imodium and both seemed to have equal effectiveness for her.  She reported at that time that whenever she would have to leave the house or speak at church (any type of stress activities) she would get loose stools and that they were easily managed with 1-2 imodium. The recommendation at that time was that since her last colonoscopy in 2008 showed a normal colon and biopsies showed collagenous colitis that she use imodium prn especially before social situation when her bowels become irritable.  She had stopped the Balsalazide for one month at the time of that visit and did well.  The patient was advised to completely discontinue the Balsalazide and use the imodium gingerly, since she would become constipated if she takes 2 at a time.    Past Medical History:  Past Medical History  Diagnosis Date  . Hypertension   . Renal disorder   . Arthritis   . CKD (chronic kidney disease) stage  2, GFR 60-89 ml/min     baseline creatinine 1.5  . Renal cell carcinoma   . Breast cancer   . Anemia   . Arthritis   . Osteopenia   . GERD (gastroesophageal reflux disease)   . PUD (peptic ulcer disease)   . Glaucoma     Problem List: Patient Active Problem List   Diagnosis Date Noted  . ARF (acute renal failure) 07/20/2014    Past Surgical History: Past Surgical History  Procedure Laterality Date  . Breast surgery      Left mastectomy  . Nephrectomy      Right nephrectomy  . Hemiarthroplasty shoulder fracture      Right shoulder  . Hip surgery      Left hip  . Colonoscopy      Allergies: Allergies  Allergen Reactions  . Statins Other (See Comments)    Reaction: Pt says "it was showing up in her liver."     Home Medications: Prescriptions prior to admission  Medication Sig Dispense Refill Last Dose  . aspirin EC 81 MG tablet Take 81 mg by mouth daily.   07/20/2014  . beta carotene w/minerals (OCUVITE) tablet Take 1 tablet by mouth every evening.   07/19/2014  . Calcium Carbonate-Vit D-Min (CALCIUM 600+D PLUS MINERALS PO) Take 1 tablet by mouth 2 (two) times daily.    07/20/2014  .  carvedilol (COREG) 3.125 MG tablet Take 6.25 mg by mouth 2 (two) times daily with a meal.   07/20/2014  . CRANBERRY PO Take 1 capsule by mouth every evening.   07/19/2014  . dorzolamide (TRUSOPT) 2 % ophthalmic solution Place 1 drop into both eyes daily.   07/20/2014  . enalapril (VASOTEC) 20 MG tablet Take 40 mg by mouth every evening.   07/19/2014  . hydrochlorothiazide (MICROZIDE) 12.5 MG capsule Take 25 mg by mouth daily.   07/20/2014  . hydroxychloroquine (PLAQUENIL) 200 MG tablet Take 200 mg by mouth daily.   07/20/2014  . IRON-VITAMIN C PO Take 1 tablet by mouth every evening.   07/19/2014  . latanoprost (XALATAN) 0.005 % ophthalmic solution Place 1 drop into both eyes at bedtime.   07/19/2014  . Loperamide HCl (IMODIUM PO) Take 1 tablet by mouth daily as needed (for diarrhea).    07/19/2014  .  pantoprazole (PROTONIX) 40 MG tablet Take 40 mg by mouth daily.   07/20/2014  . pravastatin (PRAVACHOL) 20 MG tablet Take 20 mg by mouth every evening.   07/19/2014  . sulfamethoxazole-trimethoprim (BACTRIM DS) 800-160 MG per tablet Take 1 tablet by mouth 2 (two) times daily. 20 tablet 0    Home medication reconciliation was completed with the patient.   Scheduled Inpatient Medications:   . acidophilus  1 capsule Oral BID  . aspirin EC  81 mg Oral Daily  . beta carotene w/minerals  1 tablet Oral QPM  . calcium-vitamin D  1 tablet Oral BID  . dorzolamide  1 drop Both Eyes Daily  . ferrous sulfate  325 mg Oral QPM   And  . vitamin C  250 mg Oral QPM  . heparin  5,000 Units Subcutaneous 3 times per day  . hydroxychloroquine  200 mg Oral Daily  . latanoprost  1 drop Both Eyes QHS  . metroNIDAZOLE  500 mg Oral 3 times per day  . pantoprazole  40 mg Oral Daily  . pravastatin  20 mg Oral QPM    Continuous Inpatient Infusions:   . sodium chloride 75 mL/hr at 07/21/14 1143    PRN Inpatient Medications:    Family History: family history includes CAD in her mother and sister; CVA in her father.    Social History:   reports that she has quit smoking. She does not have any smokeless tobacco history on file. She reports that she does not drink alcohol or use illicit drugs.   Review of Systems: Constitutional: Weight is stable.  Eyes: No changes in vision. ENT: No oral lesions, sore throat.  GI: see HPI.  Heme/Lymph: No easy bruising.  CV: No chest pain.  GU: No hematuria.  Integumentary: No rashes.  Neuro: No headaches.  Psych: No depression/anxiety.  Endocrine: No heat/cold intolerance.  Allergic/Immunologic: No urticaria.  Resp: No cough, SOB.  Musculoskeletal: No joint swelling.    Physical Examination: BP 105/46 mmHg  Pulse 67  Temp(Src) 98 F (36.7 C) (Oral)  Resp 16  Ht 5\' 2"  (1.575 m)  Wt 49.442 kg (109 lb)  BMI 19.93 kg/m2  SpO2 100% Gen: NAD, alert and  oriented x 4, patient does not seem to be a great historian and seemed to get dates and order of occurences a little mixed up.  Her son was present and helped with some of the history/ HEENT: PEERLA, EOMI, Neck: supple, no JVD or thyromegaly Chest: CTA bilaterally, no wheezes, crackles, or other adventitious sounds CV: RRR, no m/g/c/r Abd: soft,  mild generalized tenderness, ND, +BS in all four quadrants; no HSM, guarding, ridigity, or rebound tenderness Ext: no edema, well perfused with 2+ pulses, Skin: no rash or lesions noted Lymph: no LAD  Data: Lab Results  Component Value Date   WBC 8.9 07/21/2014   HGB 8.8* 07/21/2014   HCT 26.6* 07/21/2014   MCV 89.3 07/21/2014   PLT 179 07/21/2014    Recent Labs Lab 07/20/14 1845 07/21/14 0549  HGB 10.0* 8.8*   Lab Results  Component Value Date   NA 131* 07/21/2014   K 3.8 07/21/2014   CL 105 07/21/2014   CO2 20* 07/21/2014   BUN 39* 07/21/2014   CREATININE 1.75* 07/21/2014   Lab Results  Component Value Date   ALT 11* 07/08/2014   AST 24 07/08/2014   ALKPHOS 82 07/08/2014   BILITOT 0.5 07/08/2014   No results for input(s): APTT, INR, PTT in the last 168 hours. Assessment/Plan: Ms. Neises is a 81 y.o. female with diarrhea for the past 8 days.  Recommendations: Agree with flagyl. Since, urinalysis suggests persistent UTI, would add cipro to regimen. Since diarrhea could be flare of her colitis, would resume balsalazide Thank you for the consult. Please call with questions or concerns.  Salvadore Farber, PA-C  This case was discussed with Dr. Verdie Shire in collaboration of care.  These services were provided by Chandni Gagan S. Celesta Aver, PA-C under collaborative practice agreement with Dr. Verdie Shire.

## 2014-07-21 NOTE — Care Management (Signed)
Admitted to [facility] with the diagnosis of acute renal failure. Son, Gwyndolyn Saxon, lives with her [phone]). Sees Dr. Netty Starring.  Last seen on Monday. No home health. No skilled nursing. No home oxygen. Uses a cane to aide in ambulation. Hard of hearing. Takes care of all activities of daily living herself, still drives.  Son will transport.  [name] RN MSN Care Management [phone]

## 2014-07-21 NOTE — Plan of Care (Signed)
Problem: Discharge Progression Outcomes Goal: Discharge plan in place and appropriate Outcome: Progressing Individualization: Pt is very HOH.   Pt is a high fall risk. Offer toileting qx1hr with safety checks. Pt ambulates to the bathroom with a cane and 1xassist. H/O hyperlipidemia, glaucoma, GERD controlled with meds. H/O HTN- BP meds held. H/O CKD stage III, R nephrectomy for renal call carcinoma. Goal: Other Discharge Outcomes/Goals Outcome: Progressing Plan of Care Progress to Goal:  GI consult called.  No pain or no N/V.  Pt tolerating diet well.  Pt continues to have watery stool.  Neg for Cdiff.  Pt switched to PO flagyl instead of IV. IVF rate decreased.  Pt up to BR w/1 asst and uses cane. Pt reports that she fainted at home. Two sons live w/her.  Pt has Hx of R nephrectomy due to Renal Ca.

## 2014-07-21 NOTE — Progress Notes (Signed)
[place] [facility] [place] NAME: Margaret Matthews    MR#:  [id number]  DATE OF BIRTH:  1930-03-02  SUBJECTIVE:    REVIEW OF SYSTEMS:   ROS Tolerating Diet: Tolerating PT:   DRUG ALLERGIES:   Allergies  Allergen Reactions  . Statins Other (See Comments)    Reaction: Pt says "it was showing up in her liver."     VITALS:  Blood pressure 144/56, pulse 71, temperature 98.9 F (37.2 C), temperature source Oral, resp. rate 20, height 5\' 2"  (1.575 m), weight 49.442 kg (109 lb), SpO2 98 %.  PHYSICAL EXAMINATION:   Physical Exam  GENERAL:  81 y.o.-year-old patient lying in the bed with no acute distress.  EYES: Pupils equal, round, reactive to light and accommodation. No scleral icterus. Extraocular muscles intact.  HEENT: Head atraumatic, normocephalic. Oropharynx and nasopharynx clear.  NECK:  Supple, no jugular venous distention. No thyroid enlargement, no tenderness.  LUNGS: Normal breath sounds bilaterally, no wheezing, rales, rhonchi. No use of accessory muscles of respiration.  CARDIOVASCULAR: S1, S2 normal. No murmurs, rubs, or gallops.  ABDOMEN: Soft, nontender, nondistended. Bowel sounds present. No organomegaly or mass.  EXTREMITIES: No cyanosis, clubbing or edema b/l.    NEUROLOGIC: Cranial nerves II through XII are intact. No focal Motor or sensory deficits b/l.   PSYCHIATRIC: The patient is alert and oriented x 3.  SKIN: No obvious rash, lesion, or ulcer.    LABORATORY PANEL:   CBC  Recent Labs Lab 07/21/14 0549  WBC 8.9  HGB 8.8*  HCT 26.6*  PLT 179    Chemistries   Recent Labs Lab 07/21/14 0549  NA 131*  K 3.8  CL 105  CO2 20*  GLUCOSE 99  BUN 39*  CREATININE 1.75*  CALCIUM 8.4*    Cardiac Enzymes  Recent Labs Lab 07/20/14 1845  TROPONINI <0.03    RADIOLOGY:  No results found.   ASSESSMENT AND PLAN:   81 y.o. female with a known history of hypertension, hyperlipidemia, very hard of  hearing, CK D stage III with baseline creatinine of 1.5 secondary to right nephrectomy for renal cell carcinoma, anemia of chronic disease admitted for acute diarrhea and acute renal failure.  #1 acute diarrhea-known history of occasional episodes of diarrhea with worsening since Bactrim last week. -Pt has Biopsy proven h/o Collagenous colitis -will start po balsalazide 750 mg bid (used to take it in the past) per Gi rec -Stool for C. difficile negative - empirically on Flagyl and contact precautions.  -IV fluids, nausea medication -Advance diet as tolerated. Probiotics have been added as well.  #2 acute on chronic kidney disease-known history of CK D stage III with baseline creatinine of 1.5 -Now acute insufficiency likely due to prerenal causes -Hold blood pressure medicines as blood pressure is low normal -IV fluids and recheck creatinine in a.m.  #3 hypertension-hold her blood pressure medicines as blood pressure is now low normal. - Continue IV fluids  #4 glaucoma-continue eyedrops  #5 hyponatremia-likely hypovolemic, continue IV fluids  #6 DVT prophylaxis-subcutaneous heparin   Management plans discussed with the patient  and in agreement.  CODE STATUS: Full  DVT Prophylaxis: llovenox  TOTAL TIME TAKING CARE OF THIS PATIENT: 30 minutes.  >50% time spent on counselling and coordination of care  POSSIBLE D/C IN 1-2 DAYS, DEPENDING ON CLINICAL CONDITION.   Margaret Matthews M.D on 07/21/2014 at 2:20 PM  Between 7am to 6pm - Pager - [phone]  After 6pm go to  [email or web address] - password [email or web address]  Office  [phone]  CC: Primary care physician; Margaret Body, MD

## 2014-07-22 MED ORDER — MESALAMINE ER 250 MG PO CPCR: 500.0000 mg | ORAL_CAPSULE | Freq: Four times a day (QID) | ORAL | Status: DC

## 2014-07-22 MED ORDER — CIPROFLOXACIN HCL 250 MG PO TABS
250.0000 mg | ORAL_TABLET | Freq: Two times a day (BID) | ORAL | Status: DC
  Administered 2014-07-22 – 2014-07-23 (×3): 250 mg via ORAL
  Filled 2014-07-22 (×3): qty 1

## 2014-07-22 MED ORDER — MESALAMINE ER 250 MG PO CPCR
1000.0000 mg | ORAL_CAPSULE | Freq: Two times a day (BID) | ORAL | Status: DC
  Administered 2014-07-22 – 2014-07-23 (×3): 1000 mg via ORAL
  Filled 2014-07-22 (×4): qty 4

## 2014-07-22 NOTE — Consult Note (Signed)
  GI Inpatient Follow-up Note  Patient Identification: Margaret Matthews is a 81 y.o. female with diarrhea.   Subjective: Less diarrhea. Did not remember from yesterday. Usually takes imodium prn at home with relief. This time, imodium did not work. U/A shows persistent UTI. On flagyl. Scheduled Inpatient Medications:  . acidophilus  1 capsule Oral BID  . aspirin EC  81 mg Oral Daily  . beta carotene w/minerals  1 tablet Oral QPM  . calcium-vitamin D  1 tablet Oral BID  . dorzolamide  1 drop Both Eyes Daily  . ferrous sulfate  325 mg Oral QPM   And  . vitamin C  250 mg Oral QPM  . heparin  5,000 Units Subcutaneous 3 times per day  . hydroxychloroquine  200 mg Oral Daily  . latanoprost  1 drop Both Eyes QHS  . metroNIDAZOLE  500 mg Oral 3 times per day  . pantoprazole  40 mg Oral Daily  . pravastatin  20 mg Oral QPM    Continuous Inpatient Infusions:   . sodium chloride 75 mL/hr at 07/21/14 1909    PRN Inpatient Medications:    Review of Systems: Constitutional: Weight is stable.  Eyes: No changes in vision. ENT: No oral lesions, sore throat.  GI: see HPI.  Heme/Lymph: No easy bruising.  CV: No chest pain.  GU: No hematuria.  Integumentary: No rashes.  Neuro: No headaches.  Psych: No depression/anxiety.  Endocrine: No heat/cold intolerance.  Allergic/Immunologic: No urticaria.  Resp: No cough, SOB.  Musculoskeletal: No joint swelling.    Physical Examination: BP 144/57 mmHg  Pulse 64  Temp(Src) 97.8 F (36.6 C) (Oral)  Resp 20  Ht 5\' 2"  (1.575 m)  Wt 53.116 kg (117 lb 1.6 oz)  BMI 21.41 kg/m2  SpO2 98% Gen: NAD, alert and oriented x 4 HEENT: PEERLA, EOMI, Neck: supple, no JVD or thyromegaly Chest: CTA bilaterally, no wheezes, crackles, or other adventitious sounds CV: RRR, no m/g/c/r Abd: soft, NT, ND, +BS in all four quadrants; no HSM, guarding, ridigity, or rebound tenderness Ext: no edema, well perfused with 2+ pulses, Skin: no rash or lesions  noted Lymph: no LAD  Data: Lab Results  Component Value Date   WBC 8.9 07/21/2014   HGB 8.8* 07/21/2014   HCT 26.6* 07/21/2014   MCV 89.3 07/21/2014   PLT 179 07/21/2014    Recent Labs Lab 07/20/14 1845 07/21/14 0549  HGB 10.0* 8.8*   Lab Results  Component Value Date   NA 131* 07/21/2014   K 3.8 07/21/2014   CL 105 07/21/2014   CO2 20* 07/21/2014   BUN 39* 07/21/2014   CREATININE 1.75* 07/21/2014   Lab Results  Component Value Date   ALT 11* 07/08/2014   AST 24 07/08/2014   ALKPHOS 82 07/08/2014   BILITOT 0.5 07/08/2014   No results for input(s): APTT, INR, PTT in the last 168 hours. Assessment/Plan: Ms. Chipman is a 81 y.o. female with diarrhea. Hx of collagenous colitis.  Recommendations: Please see yesterday's notes. Treat UTI. Continue flagyl. Would resume balsalazide bid. When pt is ready for discharge, have pt f/u with Dawson Bills' offce. When to discontinue balsalazide will be decided then. thanks Please call with questions or concerns.  Stella Bortle, Lupita Dawn, MD

## 2014-07-22 NOTE — Progress Notes (Signed)
[facility] at [place] NAME: Margaret Matthews    MR#:  [id number]  DATE OF BIRTH:  02-23-1930  SUBJECTIVE:   - Known history of colitis and chronic diarrhea. Admitted for acute worsening of her diarrhea. Stool cultures are negative. Stool for C. difficile is negative. -Appreciate GI consult. Patient continues to have diarrhea again this morning. Loose stools at least 9 times a day.  REVIEW OF SYSTEMS:   Review of Systems  Constitutional: Negative for fever and chills.  Respiratory: Negative for cough, shortness of breath and wheezing.   Cardiovascular: Negative for chest pain and palpitations.  Gastrointestinal: Positive for diarrhea. Negative for nausea, vomiting, abdominal pain and constipation.  Genitourinary: Negative for dysuria.  Neurological: Negative for dizziness, seizures and headaches.   Tolerating Diet: Yes Tolerating PT: Yes. Ambulates with a cane at baseline. Continues to drive as outpatient.  DRUG ALLERGIES:   Allergies  Allergen Reactions  . Statins Other (See Comments)    Reaction: Pt says "it was showing up in her liver."     VITALS:  Blood pressure 144/57, pulse 64, temperature 97.8 F (36.6 C), temperature source Oral, resp. rate 20, height 5\' 2"  (1.575 m), weight 53.116 kg (117 lb 1.6 oz), SpO2 98 %.  PHYSICAL EXAMINATION:   Physical Exam  GENERAL:  81 y.o.-year-old patient lying in the bed with no acute distress.  EYES: Pupils equal, round, reactive to light and accommodation. No scleral icterus. Extraocular muscles intact.  HEENT: Head atraumatic, normocephalic. Oropharynx and nasopharynx clear.  Very hard of hearing. NECK:  Supple, no jugular venous distention. No thyroid enlargement, no tenderness.  LUNGS: Normal breath sounds bilaterally, no wheezing, rales, rhonchi. No use of accessory muscles of respiration.  CARDIOVASCULAR: S1, S2 normal. Loud systolic murmur is present and heard all over the  precordial area. No rubs or gallops. ABDOMEN: Soft, nontender, nondistended. Bowel sounds present. No organomegaly or mass.  EXTREMITIES: No cyanosis, clubbing or edema b/l.    NEUROLOGIC: Cranial nerves II through XII are intact. No focal Motor or sensory deficits b/l.   PSYCHIATRIC: The patient is alert and oriented x 3.  SKIN: No obvious rash, lesion, or ulcer.    LABORATORY PANEL:   CBC  Recent Labs Lab 07/21/14 0549  WBC 8.9  HGB 8.8*  HCT 26.6*  PLT 179    Chemistries   Recent Labs Lab 07/21/14 0549  NA 131*  K 3.8  CL 105  CO2 20*  GLUCOSE 99  BUN 39*  CREATININE 1.75*  CALCIUM 8.4*    Cardiac Enzymes  Recent Labs Lab 07/20/14 1845  TROPONINI <0.03    RADIOLOGY:  No results found.   ASSESSMENT AND PLAN:   81 y.o. female with a known history of hypertension, hyperlipidemia, very hard of hearing, CK D stage III with baseline creatinine of 1.5 secondary to right nephrectomy for renal cell carcinoma, anemia of chronic disease admitted for acute diarrhea and acute renal failure.  #1 Acute diarrhea-known history of occasional episodes of diarrhea with worsening since Bactrim last week. -Pt has Biopsy proven h/o Collagenous colitis -Appreciate GI consult. Patient is to be restarted on balsalazide twice a day, however it's not available in the pharmacy here, so will start on mesalamine twice a day. -Stool for C. difficile negative - empirically on Flagyl to help with diarrhea. Can discontinue contact precautions. -IV fluids, nausea medication -Advance diet as tolerated. Probiotics have been added as well.  #2 acute on chronic kidney  disease-known history of CKD stage III with baseline creatinine of 1.5 -Now acute insufficiency likely due to prerenal causes -Hold blood pressure medicines as blood pressure is low normal -IV fluids and improving creatinine -Also likely early UTI on labs. Continue ciprofloxacin.  #3 hypertension-hold her blood pressure  medicines as blood pressure is now low normal. - Continue IV fluids  #4 glaucoma-continue eyedrops  #5 hyponatremia-likely hypovolemic, continue IV fluids. -Sodium improved to 131  #6 DVT prophylaxis-subcutaneous heparin   Management plans discussed with the patient  and in agreement.  CODE STATUS: Full code  TOTAL TIME TAKING CARE OF THIS PATIENT: 30 minutes.  >50% time spent on counselling and coordination of care Discussed with Dr. Candace Cruise.  POSSIBLE D/C tomorrow, DEPENDING ON CLINICAL CONDITION.   Gladstone Lighter M.D on 07/22/2014 at 2:20 PM  Between 7am to 6pm - Pager - [phone]  After 6pm go to [email or web address] - password [email or web address] [facility]  Office  [phone]  CC: Primary care physician; Dion Body, MD

## 2014-07-22 NOTE — Plan of Care (Signed)
Problem: Discharge Progression Outcomes Goal: Other Discharge Outcomes/Goals Outcome: Progressing Plan of care progress to goal for: Pain-no c/o pain this shift Hemodynamically-VSS Complications-No evidence of this shift Diet-pt tolerating diet at this time Activity-pt up with 1 assist to BR

## 2014-07-22 NOTE — Plan of Care (Signed)
Problem: Discharge Progression Outcomes Goal: Discharge plan in place and appropriate Outcome: Progressing Individualization: Dr's note stated  Pt has Hx of Collagenous colitis is an inflammatory bowel disease affecting the colon specifically with peak incidence in the 5th decade of life, affecting women more than men. Its clinical presentation involves watery diarrhea, usually in the absence of rectal bleeding.                                                                Individualization: Pt is very HOH.   Pt is a high fall risk. Offer toileting qx1hr with safety checks. Pt ambulates to the bathroom with a cane and 1xassist. H/O hyperlipidemia, glaucoma, GERD controlled with meds. H/O HTN- BP meds held. H/O CKD stage III, R nephrectomy for renal call carcinoma.    Goal: Other Discharge Outcomes/Goals Outcome: Progressing Plan of Care Progress to Goal: Found out today that Pt has chronic problem w/diarrhea b/c of collagenous colitis.  Usually resolved w/Imodium but didn't this time.  Continues on Fluids, flagyl and probiotic.  GI consult recommends resuming balsalazide and f/u outpt.  Pt is potential d/c tomorrow.  Na low at 131.  Diarrhea persists.  No N&V or c/o pain.  Tolerating diet and up to BR w/ 1 asst.

## 2014-07-23 LAB — BASIC METABOLIC PANEL
ANION GAP: 4 — AB (ref 5–15)
BUN: 19 mg/dL (ref 6–20)
CALCIUM: 8.7 mg/dL — AB (ref 8.9–10.3)
CO2: 22 mmol/L (ref 22–32)
CREATININE: 1.17 mg/dL — AB (ref 0.44–1.00)
Chloride: 110 mmol/L (ref 101–111)
GFR calc Af Amer: 49 mL/min — ABNORMAL LOW (ref >60)
GFR calc non Af Amer: 42 mL/min — ABNORMAL LOW (ref >60)
GLUCOSE: 91 mg/dL (ref 65–99)
Potassium: 3.2 mmol/L — ABNORMAL LOW (ref 3.5–5.1)
SODIUM: 136 mmol/L (ref 135–145)

## 2014-07-23 LAB — HEMOGLOBIN: Hemoglobin: 8.8 g/dL — ABNORMAL LOW (ref 12.0–16.0)

## 2014-07-23 LAB — STOOL CULTURE

## 2014-07-23 MED ORDER — MESALAMINE ER 250 MG PO CPCR: 1000.0000 mg | ORAL_CAPSULE | Freq: Two times a day (BID) | ORAL | Status: DC

## 2014-07-23 MED ORDER — RISAQUAD PO CAPS: 1.0000 | ORAL_CAPSULE | Freq: Two times a day (BID) | ORAL | Status: DC

## 2014-07-23 MED ORDER — ENALAPRIL MALEATE 5 MG PO TABS: 5.0000 mg | ORAL_TABLET | Freq: Two times a day (BID) | ORAL | Status: DC

## 2014-07-23 MED ORDER — METRONIDAZOLE 500 MG PO TABS: 500.0000 mg | ORAL_TABLET | Freq: Three times a day (TID) | ORAL | Status: DC

## 2014-07-23 MED ORDER — POTASSIUM CHLORIDE CRYS ER 20 MEQ PO TBCR
40.0000 meq | EXTENDED_RELEASE_TABLET | Freq: Once | ORAL | Status: AC
  Administered 2014-07-23: 10:00:00 40 meq via ORAL
  Filled 2014-07-23: qty 2

## 2014-07-23 NOTE — Plan of Care (Signed)
Problem: Discharge Progression Outcomes Goal: Other Discharge Outcomes/Goals Outcome: Progressing Plan of care progress to goal for: Pain-no c/o pain this shift Hemodynamically-VSS, NS @ 75 ml/hr continues Complications-no evidence of this shift Diet-pt tolerating diet at this time Activity-pt up to BR with 1 assist

## 2014-07-23 NOTE — Care Management (Signed)
Physical therapy evaluation completed. No physical therapy needs identified.  Discharge to home today per Dr. Darletta Moll RN MSN Care Management [phone]

## 2014-07-23 NOTE — Progress Notes (Signed)
Patient being discharged home per MD order. VSS. Prescriptions given to patient. All discharge instructions given and all questions answered.

## 2014-07-23 NOTE — Discharge Instructions (Signed)

## 2014-07-23 NOTE — Progress Notes (Signed)
[facility] at [place] NAME: Shakeena Kafer    MR#:  [id number]  DATE OF BIRTH:  [birth year]  SUBJECTIVE:   - Known history of colitis and chronic diarrhea. Admitted for acute worsening of her diarrhea. Stool cultures are negative. Stool for C. difficile is negative. -Appreciate GI consult. Diarrhea has improved since starting mesalamine.  -For discharge today   REVIEW OF SYSTEMS:   Review of Systems  Constitutional: Negative for fever and chills.  Respiratory: Negative for cough, shortness of breath and wheezing.   Cardiovascular: Negative for chest pain and palpitations.  Gastrointestinal: Negative for nausea, vomiting, abdominal pain, diarrhea and constipation.       Diarrhea has improved  Genitourinary: Negative for dysuria.  Neurological: Negative for dizziness, seizures and headaches.   Tolerating Diet: Yes Tolerating PT: Yes. Ambulates with a cane at baseline. Continues to drive as outpatient.  DRUG ALLERGIES:   Allergies  Allergen Reactions  . Statins Other (See Comments)    Reaction: Pt says "it was showing up in her liver."     VITALS:  Blood pressure 174/62, pulse 67, temperature 97.8 F (36.6 C), temperature source Oral, resp. rate 18, height 5\' 2"  (1.575 m), weight 53.116 kg (117 lb 1.6 oz), SpO2 98 %.  PHYSICAL EXAMINATION:   Physical Exam  GENERAL:  81 y.o.-year-old patient lying in the bed with no acute distress.  EYES: Pupils equal, round, reactive to light and accommodation. No scleral icterus. Extraocular muscles intact.  HEENT: Head atraumatic, normocephalic. Oropharynx and nasopharynx clear.  Very hard of hearing. NECK:  Supple, no jugular venous distention. No thyroid enlargement, no tenderness.  LUNGS: Normal breath sounds bilaterally, no wheezing, rales, rhonchi. No use of accessory muscles of respiration.  CARDIOVASCULAR: S1, S2 normal. Loud systolic murmur is present and heard all over the  precordial area. No rubs or gallops. ABDOMEN: Soft, nontender, nondistended. Bowel sounds present. No organomegaly or mass.  EXTREMITIES: No cyanosis, clubbing or edema b/l.    NEUROLOGIC: Cranial nerves II through XII are intact. No focal Motor or sensory deficits b/l.   PSYCHIATRIC: The patient is alert and oriented x 3.  SKIN: No obvious rash, lesion, or ulcer.    LABORATORY PANEL:   CBC  Recent Labs Lab 07/21/14 0549 07/23/14 0434  WBC 8.9  --   HGB 8.8* 8.8*  HCT 26.6*  --   PLT 179  --     Chemistries   Recent Labs Lab 07/23/14 0434  NA 136  K 3.2*  CL 110  CO2 22  GLUCOSE 91  BUN 19  CREATININE 1.17*  CALCIUM 8.7*    Cardiac Enzymes  Recent Labs Lab 07/20/14 1845  TROPONINI <0.03    RADIOLOGY:  No results found.   ASSESSMENT AND PLAN:   81 y.o. female with a known history of hypertension, hyperlipidemia, very hard of hearing, CKD stage III with baseline creatinine of 1.5 secondary to right nephrectomy for renal cell carcinoma, anemia of chronic disease admitted for acute diarrhea and acute renal failure.  #1 Acute diarrhea-known history of occasional episodes of diarrhea with worsening since Bactrim last week. -Pt has Biopsy proven h/o Collagenous colitis -Appreciate GI consult. Patient is to be restarted on balsalazide twice a day, however it's not available in the pharmacy here, so started on mesalamine twice a day with improvement in her diarrhea. -Stool for C. difficile negative - empirically on Flagyl to help with diarrhea. Can discontinue contact precautions. -IV fluids, nausea  medication -Advance diet as tolerated. Probiotics have been added as well.  #2 acute on chronic kidney disease-known history of CKD stage III with baseline creatinine of 1.5 -Now acute insufficiency likely due to prerenal causes -Improved with IV fluids. -Also likely early UTI on labs. Continue ciprofloxacin for a total of 3 days.  #3 hypertension-medications were  held due to hypotension on admission. However blood pressure is elevated now. Patient can restart her home medications. - Continue IV fluids  #4 glaucoma-continue eyedrops  #5 hyponatremia-likely hypovolemic, continue IV fluids. -Sodium improved to 136  #6 hypokalemia-will be replaced  # 7 DVT prophylaxis-subcutaneous heparin   Management plans discussed with the patient  and in agreement.  CODE STATUS: Full code  TOTAL TIME TAKING CARE OF THIS PATIENT: 38 minutes.  >50% time spent on counselling and coordination of care Discussed with Dr. Candace Cruise.  POSSIBLE D/Ctoday, DEPENDING ON CLINICAL CONDITION.   Gladstone Lighter M.D on 07/23/2014 at 2:20 PM  Between 7am to 6pm - Pager - [phone]  After 6pm go to [email or web address] - password [email or web address] [facility]  Office  [phone]  CC: Primary care physician; Dion Body, MD

## 2014-07-23 NOTE — Discharge Summary (Signed)
[facility] at [place] NAME: Margaret Matthews    MR#:  [id number]  DATE OF BIRTH:  [birth year]  DATE OF ADMISSION:  07/20/2014 ADMITTING PHYSICIAN: Gladstone Lighter, MD  DATE OF DISCHARGE: 07/23/2014   PRIMARY CARE PHYSICIAN: Dion Body, MD    ADMISSION DIAGNOSIS:  Diarrhea [R19.7] Acute kidney injury [N17.9]  DISCHARGE DIAGNOSIS:  Active Problems:   ARF (acute renal failure)   SECONDARY DIAGNOSIS:   Past Medical History  Diagnosis Date  . Hypertension   . Renal disorder   . Arthritis   . CKD (chronic kidney disease) stage 2, GFR 60-89 ml/min     baseline creatinine 1.5  . Renal cell carcinoma   . Breast cancer   . Anemia   . Arthritis   . Osteopenia   . GERD (gastroesophageal reflux disease)   . PUD (peptic ulcer disease)   . Glaucoma     HOSPITAL COURSE:   81 y.o. female with a known history of hypertension, hyperlipidemia, very hard of hearing, CKD stage III with baseline creatinine of 1.5 secondary to right nephrectomy for renal cell carcinoma, anemia of chronic disease admitted for acute diarrhea and acute renal failure.  #1 Acute diarrhea-known history of occasional episodes of diarrhea with worsening since Bactrim last week. -Pt has Biopsy proven h/o Collagenous colitis -Appreciate GI consult. Patient is to be restarted on balsalazide twice a day, however it's not available in the pharmacy here, so started on mesalamine twice a day with improvement in her diarrhea. -Stool for C. difficile negative - empirically on Flagyl to help with diarrhea. Can discontinue contact precautions. -IV fluids, nausea medication -Advance diet as tolerated. Probiotics have been added as well.  #2 acute on chronic kidney disease-known history of CKD stage III with baseline creatinine of 1.5 -Now acute insufficiency likely due to prerenal causes -Improved with IV fluids. -Also likely early UTI on labs. Continue ciprofloxacin  for a total of 3 days.  #3 hypertension-medications were held due to hypotension on admission. However blood pressure is elevated now. Patient can restart her home medications. - Continue IV fluids  #4 glaucoma-continue eyedrops  #5 hyponatremia-likely hypovolemic, continue IV fluids. -Sodium improved to 136  #6 hypokalemia-will be replaced  DISCHARGE CONDITIONS:   Stable  CONSULTS OBTAINED:  Treatment Team:  Hulen Luster, MD  DRUG ALLERGIES:   Allergies  Allergen Reactions  . Statins Other (See Comments)    Reaction: Pt says "it was showing up in her liver."     DISCHARGE MEDICATIONS:   Current Discharge Medication List    START taking these medications   Details  acidophilus (RISAQUAD) CAPS capsule Take 1 capsule by mouth 2 (two) times daily. Qty: 15 capsule, Refills: 0    mesalamine (PENTASA) 250 MG CR capsule Take 4 capsules (1,000 mg total) by mouth 2 (two) times daily. Qty: 30 capsule, Refills: 2    metroNIDAZOLE (FLAGYL) 500 MG tablet Take 1 tablet (500 mg total) by mouth every 8 (eight) hours. Qty: 20 tablet, Refills: 0      CONTINUE these medications which have CHANGED   Details  enalapril (VASOTEC) 5 MG tablet Take 1 tablet (5 mg total) by mouth 2 (two) times daily. Qty: 60 tablet, Refills: 0      CONTINUE these medications which have NOT CHANGED   Details  aspirin EC 81 MG tablet Take 81 mg by mouth daily.    beta carotene w/minerals (OCUVITE) tablet Take 1 tablet by mouth every  evening.    Calcium Carbonate-Vit D-Min (CALCIUM 600+D PLUS MINERALS PO) Take 1 tablet by mouth 2 (two) times daily.     carvedilol (COREG) 3.125 MG tablet Take 6.25 mg by mouth 2 (two) times daily with a meal.    CRANBERRY PO Take 1 capsule by mouth every evening.    dorzolamide (TRUSOPT) 2 % ophthalmic solution Place 1 drop into both eyes daily.    hydrochlorothiazide (MICROZIDE) 12.5 MG capsule Take 25 mg by mouth daily.    hydroxychloroquine (PLAQUENIL) 200 MG  tablet Take 200 mg by mouth daily.    IRON-VITAMIN C PO Take 1 tablet by mouth every evening.    latanoprost (XALATAN) 0.005 % ophthalmic solution Place 1 drop into both eyes at bedtime.    Loperamide HCl (IMODIUM PO) Take 1 tablet by mouth daily as needed (for diarrhea).     pantoprazole (PROTONIX) 40 MG tablet Take 40 mg by mouth daily.    pravastatin (PRAVACHOL) 20 MG tablet Take 20 mg by mouth every evening.      STOP taking these medications     sulfamethoxazole-trimethoprim (BACTRIM DS) 800-160 MG per tablet          DISCHARGE INSTRUCTIONS:   1. PCP f/u in 2 weeks 2. GI f/u with Dr. Candace Cruise in 1 week  If you experience worsening of your admission symptoms, develop shortness of breath, life threatening emergency, suicidal or homicidal thoughts you must seek medical attention immediately by calling 911 or calling your MD immediately  if symptoms less severe.  You Must read complete instructions/literature along with all the possible adverse reactions/side effects for all the Medicines you take and that have been prescribed to you. Take any new Medicines after you have completely understood and accept all the possible adverse reactions/side effects.   Please note  You were cared for by a hospitalist during your hospital stay. If you have any questions about your discharge medications or the care you received while you were in the hospital after you are discharged, you can call the unit and asked to speak with the hospitalist on call if the hospitalist that took care of you is not available. Once you are discharged, your primary care physician will handle any further medical issues. Please note that NO REFILLS for any discharge medications will be authorized once you are discharged, as it is imperative that you return to your primary care physician (or establish a relationship with a primary care physician if you do not have one) for your aftercare needs so that they can reassess your  need for medications and monitor your lab values.    Today   CHIEF COMPLAINT:   Chief Complaint  Patient presents with  . Near Syncope  . Diarrhea     VITAL SIGNS:  Blood pressure 174/62, pulse 67, temperature 97.8 F (36.6 C), temperature source Oral, resp. rate 18, height 5\' 2"  (1.575 m), weight 53.116 kg (117 lb 1.6 oz), SpO2 98 %.  I/O:   Intake/Output Summary (Last 24 hours) at 07/23/14 0801 Last data filed at 07/23/14 0005  Gross per 24 hour  Intake    480 ml  Output    700 ml  Net   -220 ml    PHYSICAL EXAMINATION:   Physical Exam  GENERAL: 81 y.o.-year-old patient lying in the bed with no acute distress.  EYES: Pupils equal, round, reactive to light and accommodation. No scleral icterus. Extraocular muscles intact.  HEENT: Head atraumatic, normocephalic. Oropharynx and nasopharynx clear.  Very hard of hearing. NECK: Supple, no jugular venous distention. No thyroid enlargement, no tenderness.  LUNGS: Normal breath sounds bilaterally, no wheezing, rales, rhonchi. No use of accessory muscles of respiration.  CARDIOVASCULAR: S1, S2 normal. Loud systolic murmur is present and heard all over the precordial area. No rubs or gallops. ABDOMEN: Soft, nontender, nondistended. Bowel sounds present. No organomegaly or mass.  EXTREMITIES: No cyanosis, clubbing or edema b/l.  NEUROLOGIC: Cranial nerves II through XII are intact. No focal Motor or sensory deficits b/l.  PSYCHIATRIC: The patient is alert and oriented x 3.  SKIN: No obvious rash, lesion, or ulcer.   DATA REVIEW:   CBC  Recent Labs Lab 07/21/14 0549 07/23/14 0434  WBC 8.9  --   HGB 8.8* 8.8*  HCT 26.6*  --   PLT 179  --     Chemistries   Recent Labs Lab 07/23/14 0434  NA 136  K 3.2*  CL 110  CO2 22  GLUCOSE 91  BUN 19  CREATININE 1.17*  CALCIUM 8.7*    Cardiac Enzymes  Recent Labs Lab 07/20/14 1845  TROPONINI <0.03    Microbiology Results  Results for orders  placed or performed during the hospital encounter of 07/20/14  C difficile quick scan w PCR reflex ([place] only)     Status: None   Collection Time: 07/21/14 12:42 AM  Result Value Ref Range Status   C Diff antigen NEGATIVE  Final   C Diff toxin NEGATIVE  Final   C Diff interpretation Negative for C. difficile  Final  Stool culture     Status: None (Preliminary result)   Collection Time: 07/21/14 12:42 AM  Result Value Ref Range Status   Specimen Description STOOL  Final   Special Requests NONE  Final   Culture   Final    NO SALMONELLA OR SHIGELLA ISOLATED NO CAMPYLOBACTER DETECTED    Report Status PENDING  Incomplete    RADIOLOGY:  No results found.  EKG:   Orders placed or performed during the hospital encounter of 07/20/14  . ED EKG  . ED EKG  . EKG 12-Lead  . EKG 12-Lead      Management plans discussed with the patient, family and they are in agreement.  CODE STATUS:     Code Status Orders        Start     Ordered   07/20/14 2230  Full code   Continuous     07/20/14 2230    Advance Directive Documentation        Most Recent Value   Type of Advance Directive  Living will   Pre-existing out of facility DNR order (yellow form or pink MOST form)     "MOST" Form in Place?        TOTAL TIME TAKING CARE OF THIS PATIENT: 40 minutes.    Gladstone Lighter M.D on 07/23/2014 at 8:01 AM  Between 7am to 6pm - Pager - [phone]  After 6pm go to [email or web address] - password [email or web address] [facility]  Office  [phone]  CC: Primary care physician; Dion Body, MD

## 2014-07-23 NOTE — Evaluation (Signed)
Physical Therapy Evaluation Patient Details Name: Margaret Matthews MRN: [id number] DOB: 26-Apr-1930 Today's Date: 07/23/2014   History of Present Illness  Pt here with renal failure, feeling better but weak  Clinical Impression  Pt is able to ambulate well with cane and actually shows improvement from start of ambulation to end of session.  She is able to ambulate up/down steps w/o issue and though she is not at her baseline she is safe and relatively confident t/o the session.    Follow Up Recommendations No PT follow up (could benefit from continued ambulation here in hospital)    Equipment Recommendations  3in1 (PT)    Recommendations for Other Services       Precautions / Restrictions Precautions Precautions: Fall Restrictions Weight Bearing Restrictions: No      Mobility  Bed Mobility Overal bed mobility: Independent                Transfers Overall transfer level: Independent Equipment used: Straight cane                Ambulation/Gait Ambulation/Gait assistance: Min guard Ambulation Distance (Feet): 250 Feet Assistive device: Straight cane       General Gait Details: Pt reports walking considerably slower than her normal, but she has no safety concerns and only minimal fatigue with the effort  Stairs Stairs: Yes Stairs assistance: Min guard Stair Management: One rail Right Number of Stairs: 8 General stair comments: Pt shows no hesitancy or safety concerns on the steps, reciprocal pattern going up and step-to descending  Wheelchair Mobility    Modified Rankin (Stroke Patients Only)       Balance                                             Pertinent Vitals/Pain Pain Assessment: No/denies pain    Home Living Family/patient expects to be discharged to:: Private residence Living Arrangements: Children Available Help at Discharge: Family Type of Home: House Home Access: Stairs to enter   [employer] of  Steps: 3 steps with rail in garage, 13 steps w/ rail up to bed room  Home Layout: Two level Home Equipment: Cane - single point      Prior Function Level of Independence: Independent with assistive device(s)               Hand Dominance        Extremity/Trunk Assessment   Upper Extremity Assessment: Overall WFL for tasks assessed           Lower Extremity Assessment: Overall WFL for tasks assessed         Communication   Communication: HOH  Cognition Arousal/Alertness: Awake/alert Behavior During Therapy: WFL for tasks assessed/performed Overall Cognitive Status: Within Functional Limits for tasks assessed                      General Comments      Exercises        Assessment/Plan    PT Assessment    PT Diagnosis Difficulty walking   PT Problem List    PT Treatment Interventions     PT Goals (Current goals can be found in the Care Plan section) Acute Rehab PT Goals Patient Stated Goal: "Get back to my normal activities" PT Goal Formulation: With patient Time For Goal Achievement: 08/06/14 Potential to Achieve Goals: Good  Frequency     Barriers to discharge        Co-evaluation               End of Session Equipment Utilized During Treatment: Gait belt     Nurse Communication: Mobility status    Functional Assessment Tool Used:  (clinical judgement) Functional Limitation: Mobility: Walking and moving around Mobility: Walking and Moving Around Current Status (F6812): At least 1 percent but less than 20 percent impaired, limited or restricted Mobility: Walking and Moving Around Goal Status [phone]): 0 percent impaired, limited or restricted    Time: 0850-0915 PT Time Calculation (min) (ACUTE ONLY): 25 min   Charges:   PT Evaluation $Initial PT Evaluation Tier I: 1 Procedure     PT G Codes:   PT G-Codes **NOT FOR INPATIENT CLASS** Functional Assessment Tool Used:  (clinical judgement) Functional Limitation:  Mobility: Walking and moving around Mobility: Walking and Moving Around Current Status (Y1749): At least 1 percent but less than 20 percent impaired, limited or restricted Mobility: Walking and Moving Around Goal Status [phone]): 0 percent impaired, limited or restricted   Wayne Both, PT, DPT [phone]  Kreg Shropshire 07/23/2014, 10:52 AM

## 2014-07-24 ENCOUNTER — Emergency Department: Payer: Medicare PPO

## 2014-07-24 ENCOUNTER — Encounter: Payer: Self-pay | Admitting: Emergency Medicine

## 2014-07-24 ENCOUNTER — Emergency Department
Admission: EM | Admit: 2014-07-24 | Discharge: 2014-07-24 | Disposition: A | Discharge status: Home / Self Care | Payer: Medicare PPO | Attending: Emergency Medicine | Admitting: Emergency Medicine

## 2014-07-24 DIAGNOSIS — R197 Diarrhea, unspecified: Secondary | ICD-10-CM | POA: Diagnosis not present

## 2014-07-24 DIAGNOSIS — Z87891 Personal history of nicotine dependence: Secondary | ICD-10-CM | POA: Diagnosis not present

## 2014-07-24 DIAGNOSIS — Z7982 Long term (current) use of aspirin: Secondary | ICD-10-CM | POA: Insufficient documentation

## 2014-07-24 DIAGNOSIS — E871 Hypo-osmolality and hyponatremia: Secondary | ICD-10-CM | POA: Diagnosis not present

## 2014-07-24 DIAGNOSIS — M7989 Other specified soft tissue disorders: Secondary | ICD-10-CM | POA: Diagnosis present

## 2014-07-24 DIAGNOSIS — I129 Hypertensive chronic kidney disease with stage 1 through stage 4 chronic kidney disease, or unspecified chronic kidney disease: Secondary | ICD-10-CM | POA: Diagnosis not present

## 2014-07-24 DIAGNOSIS — Z79899 Other long term (current) drug therapy: Secondary | ICD-10-CM | POA: Diagnosis not present

## 2014-07-24 DIAGNOSIS — N182 Chronic kidney disease, stage 2 (mild): Secondary | ICD-10-CM | POA: Diagnosis not present

## 2014-07-24 LAB — CBC
HCT: 29.5 % — ABNORMAL LOW (ref 35.0–47.0)
Hemoglobin: 9.7 g/dL — ABNORMAL LOW (ref 12.0–16.0)
MCH: 29.2 pg (ref 26.0–34.0)
MCHC: 32.8 g/dL (ref 32.0–36.0)
MCV: 89.2 fL (ref 80.0–100.0)
Platelets: 233 10*3/uL (ref 150–440)
RBC: 3.31 MIL/uL — AB (ref 3.80–5.20)
RDW: 14.3 % (ref 11.5–14.5)
WBC: 5.5 10*3/uL (ref 3.6–11.0)

## 2014-07-24 LAB — COMPREHENSIVE METABOLIC PANEL
ALT: 13 U/L — AB (ref 14–54)
AST: 37 U/L (ref 15–41)
Albumin: 3.3 g/dL — ABNORMAL LOW (ref 3.5–5.0)
Alkaline Phosphatase: 63 U/L (ref 38–126)
Anion gap: 4 — ABNORMAL LOW (ref 5–15)
BUN: 11 mg/dL (ref 6–20)
CALCIUM: 8.8 mg/dL — AB (ref 8.9–10.3)
CO2: 21 mmol/L — AB (ref 22–32)
CREATININE: 1.08 mg/dL — AB (ref 0.44–1.00)
Chloride: 102 mmol/L (ref 101–111)
GFR calc Af Amer: 54 mL/min — ABNORMAL LOW (ref >60)
GFR, EST NON AFRICAN AMERICAN: 46 mL/min — AB (ref >60)
GLUCOSE: 101 mg/dL — AB (ref 65–99)
Potassium: 4.5 mmol/L (ref 3.5–5.1)
Sodium: 127 mmol/L — ABNORMAL LOW (ref 135–145)
TOTAL PROTEIN: 6.2 g/dL — AB (ref 6.5–8.1)
Total Bilirubin: 0.8 mg/dL (ref 0.3–1.2)

## 2014-07-24 LAB — SODIUM: SODIUM: 130 mmol/L — AB (ref 135–145)

## 2014-07-24 LAB — BRAIN NATRIURETIC PEPTIDE: B Natriuretic Peptide: 594 pg/mL — ABNORMAL HIGH (ref 0.0–100.0)

## 2014-07-24 MED ORDER — SODIUM CHLORIDE 0.9 % IV BOLUS (SEPSIS)
500.0000 mL | Freq: Once | INTRAVENOUS | Status: AC
  Administered 2014-07-24: 500 mL via INTRAVENOUS

## 2014-07-24 MED ORDER — SODIUM CHLORIDE 0.9 % IV BOLUS (SEPSIS): 500.0000 mL | Freq: Once | INTRAVENOUS | Status: DC

## 2014-07-24 NOTE — ED Notes (Addendum)
Pt states she was discharged yesterday for Kidney failure. Pt states she noticed when she got up the  swelling to both of her ankles. Pt denies SOB or pain at this time. Pt states she drank lots of gatorade and water last night. Pt states no changes in urination

## 2014-07-24 NOTE — Discharge Instructions (Signed)
Please continue your current medications.  It is not necessary to drink as much fluid as you currently are. I would recommend that you do make sure you stay hydrated, but is not necessary to have 2 large pitchers of water daily plus Gatorade. I recommend that you make sure you drink at least 8 cups of water a day, but do not drink to excess.  Follow-up with Dr. Netty Starring Monday or Tuesday.  Return to the ER right away if he develop difficulty breathing, fever, vomiting, feel dehydrated, very weak and tired, or other new concerns arise.

## 2014-07-24 NOTE — ED Notes (Signed)
Lab calls with abnormal sodium level and asked to repeat lab drawn. Lab tech reports there is a significant change from yesterday.

## 2014-07-24 NOTE — ED Notes (Signed)
Discussed case with Dr. Owens Shark new verbal orders received.

## 2014-07-24 NOTE — ED Notes (Signed)
Patient with no complaints at this time. Respirations even and unlabored. Skin warm/dry. Discharge instructions reviewed with patient at this time. Patient given opportunity to voice concerns/ask questions. IV removed per policy and band-aid applied to site. Patient discharged at this time and left Emergency Department with steady gait, accompanied by son.

## 2014-07-24 NOTE — Consult Note (Signed)
[employer] [facility] [place] NAME: Margaret Matthews    MR#:  [id number]  DATE OF BIRTH:  10-Aug-1930  DATE OF ADMISSION:  07/24/2014  PRIMARY CARE PHYSICIAN: Dion Body, MD   REQUESTING/REFERRING PHYSICIAN: Dr. Delman Kitten  CHIEF COMPLAINT:   Chief Complaint  Patient presents with  . Leg Swelling    HISTORY OF PRESENT ILLNESS:  Margaret Matthews  is a 81 y.o. female with a known history of hypertension, osteoporosis, chronic kidney disease stage II, history of renal cell carcinoma, breast cancer, chronic anemia, GERD, glaucoma, who presented to the hospital due to some mild lower extremity swelling. Patient was just discharged from the hospital yesterday and now returns with the above complaint. She denies any nausea, vomiting, diarrhea, fever, chills, chest pain, palpitations, orthopnea, paroxysmal nocturnal dyspnea, or any other associated symptoms presently. Patient was noted to mildly hyponatremic at 127 and therefore hospitalist services were contacted for consultation.  PAST MEDICAL HISTORY:   Past Medical History  Diagnosis Date  . Hypertension   . Renal disorder   . Arthritis   . CKD (chronic kidney disease) stage 2, GFR 60-89 ml/min     baseline creatinine 1.5  . Renal cell carcinoma   . Breast cancer   . Anemia   . Arthritis   . Osteopenia   . GERD (gastroesophageal reflux disease)   . PUD (peptic ulcer disease)   . Glaucoma     PAST SURGICAL HISTOIRY:   Past Surgical History  Procedure Laterality Date  . Breast surgery      Left mastectomy  . Nephrectomy      Right nephrectomy  . Hemiarthroplasty shoulder fracture      Right shoulder  . Hip surgery      Left hip  . Colonoscopy      SOCIAL HISTORY:   History  Substance Use Topics  . Smoking status: Former Research scientist (life sciences)  . Smokeless tobacco: Not on file  . Alcohol Use: No    FAMILY HISTORY:   Family History  Problem Relation Age of Onset  . CAD Mother   .  CVA Father   . CAD Sister     DRUG ALLERGIES:   Allergies  Allergen Reactions  . Statins Other (See Comments)    Reaction: Pt says "it was showing up in her liver."     REVIEW OF SYSTEMS:   Review of Systems  Constitutional: Negative for fever and weight loss.  HENT: Negative for congestion, nosebleeds and tinnitus.   Eyes: Negative for blurred vision, double vision and redness.  Respiratory: Negative for cough, hemoptysis and shortness of breath.   Cardiovascular: Positive for leg swelling (mild). Negative for chest pain, orthopnea and PND.  Gastrointestinal: Negative for nausea, vomiting, abdominal pain, diarrhea and melena.  Genitourinary: Negative for dysuria, urgency and hematuria.  Musculoskeletal: Negative for joint pain and falls.  Skin: Negative for rash.  Neurological: Positive for weakness (genearlized). Negative for dizziness, tingling, sensory change, focal weakness, seizures and headaches.  Endo/Heme/Allergies: Negative for polydipsia. Does not bruise/bleed easily.  Psychiatric/Behavioral: Negative for depression and memory loss. The patient is nervous/anxious.      MEDICATIONS AT HOME:   Prior to Admission medications   Medication Sig Start Date End Date Taking? Authorizing Provider  acidophilus (RISAQUAD) CAPS capsule Take 1 capsule by mouth 2 (two) times daily. 07/23/14   Gladstone Lighter, MD  aspirin EC 81 MG tablet Take 81 mg by mouth daily.    Historical Provider,  MD  beta carotene w/minerals (OCUVITE) tablet Take 1 tablet by mouth every evening.    Historical Provider, MD  Calcium Carbonate-Vit D-Min (CALCIUM 600+D PLUS MINERALS PO) Take 1 tablet by mouth 2 (two) times daily.     Historical Provider, MD  carvedilol (COREG) 3.125 MG tablet Take 6.25 mg by mouth 2 (two) times daily with a meal.    Historical Provider, MD  CRANBERRY PO Take 1 capsule by mouth every evening.    Historical Provider, MD  dorzolamide (TRUSOPT) 2 % ophthalmic solution Place 1 drop  into both eyes daily.    Historical Provider, MD  enalapril (VASOTEC) 5 MG tablet Take 1 tablet (5 mg total) by mouth 2 (two) times daily. 07/23/14   Gladstone Lighter, MD  hydrochlorothiazide (MICROZIDE) 12.5 MG capsule Take 25 mg by mouth daily.    Historical Provider, MD  hydroxychloroquine (PLAQUENIL) 200 MG tablet Take 200 mg by mouth daily.    Historical Provider, MD  IRON-VITAMIN C PO Take 1 tablet by mouth every evening.    Historical Provider, MD  latanoprost (XALATAN) 0.005 % ophthalmic solution Place 1 drop into both eyes at bedtime.    Historical Provider, MD  Loperamide HCl (IMODIUM PO) Take 1 tablet by mouth daily as needed (for diarrhea).     Historical Provider, MD  mesalamine (PENTASA) 250 MG CR capsule Take 4 capsules (1,000 mg total) by mouth 2 (two) times daily. 07/23/14   Gladstone Lighter, MD  metroNIDAZOLE (FLAGYL) 500 MG tablet Take 1 tablet (500 mg total) by mouth every 8 (eight) hours. 07/23/14   Gladstone Lighter, MD  pantoprazole (PROTONIX) 40 MG tablet Take 40 mg by mouth daily.    Historical Provider, MD  pravastatin (PRAVACHOL) 20 MG tablet Take 20 mg by mouth every evening.    Historical Provider, MD      VITAL SIGNS:  Blood pressure 172/69, pulse 65, temperature 97.7 F (36.5 C), temperature source Oral, resp. rate 22, height 5\' 2"  (1.575 m), weight 49.442 kg (109 lb), SpO2 98 %.  PHYSICAL EXAMINATION:  GENERAL:  81 y.o.-year-old patient lying in the bed with no acute distress. Very hard of hearing EYES: Pupils equal, round, reactive to light and accommodation. No scleral icterus. Extraocular muscles intact.  HEENT: Head atraumatic, normocephalic. Oropharynx and nasopharynx clear.  NECK:  Supple, no jugular venous distention. No thyroid enlargement, no tenderness.  LUNGS: Normal breath sounds bilaterally, no wheezing, rales,rhonchi or crepitation. No use of accessory muscles of respiration.  CARDIOVASCULAR: S1, S2 regular. 3/6 systolic ejection murmur heard at  the right sternal border with radiation to the carotids, rubs, gallops, clicks.  ABDOMEN: Soft, nontender, nondistended. Bowel sounds present. No organomegaly or mass.  EXTREMITIES: Mild ankle edema bilaterally, no cyanosis, or clubbing.  NEUROLOGIC: Cranial nerves II through XII are intact. No focal motor or sensory deficits appreciated bilaterally  PSYCHIATRIC: The patient is alert and oriented x 3. Anxious SKIN: No obvious rash, lesion, or ulcer.   LABORATORY PANEL:   CBC  Recent Labs Lab 07/24/14 1259  WBC 5.5  HGB 9.7*  HCT 29.5*  PLT 233   ------------------------------------------------------------------------------------------------------------------  Chemistries   Recent Labs Lab 07/24/14 1259 07/24/14 1835  NA 127* 130*  K 4.5  --   CL 102  --   CO2 21*  --   GLUCOSE 101*  --   BUN 11  --   CREATININE 1.08*  --   CALCIUM 8.8*  --   AST 37  --   ALT 13*  --  ALKPHOS 63  --   BILITOT 0.8  --    ------------------------------------------------------------------------------------------------------------------  Cardiac Enzymes  Recent Labs Lab 07/20/14 1845  TROPONINI <0.03   ------------------------------------------------------------------------------------------------------------------  RADIOLOGY:  Dg Chest 2 View  07/24/2014   CLINICAL DATA:  Extremity swelling for the past 2 days.  EXAM: CHEST  2 VIEW  COMPARISON:  09/08/2012.  FINDINGS: The cardiac silhouette remains near the upper limit of normal in size. Interval minimal linear density at the right lung base and minimal bilateral pleural thickening or fluid. The interstitial markings remain mildly prominent and the lungs are mildly hyperexpanded with mild central peribronchial thickening. Diffuse osteopenia.  IMPRESSION: 1. Interval minimal linear atelectasis or scarring at the right lung base. 2. Interval minimal bilateral pleural fluid or thickening. 3. Mild changes of COPD and chronic bronchitis.    Electronically Signed   By: Claudie Revering M.D.   On: 07/24/2014 17:34     IMPRESSION AND PLAN:   81 year old female with past medical history of hypertension, chronic kidney disease stage II, osteoarthritis, GERD, glaucoma, history of collagenous colitis who presented to the hospital due to mild ankle swelling and noted to be hyponatremic.  #1 hyponatremia-this is a mild hyponatremia likely hypovolemic hypotonic in nature. -Patient was given a gentle IV fluid bolus and repeat sodium has improved from 12 to 130. -Clinically she is symptomatic for being discharged home from the emergency room.  #2 history of collagenous colitis-continue mesalamine. -Continue follow-up with GI as needed.  #3 hypertension-hemodynamically stable, continue home meds.  #4 glaucoma-continue dorzolamide, latanoprost eyedrops.  #5 GERD-continue Protonix.  Patient is being discharged home from the emergency room with close follow-up with her primary care physician as an outpatient. Discussed plan with the ER physician and he is in agreement with the plan.   All the records are reviewed and case discussed with Consulting provider. Management plans discussed with the patient, family and they are in agreement.  CODE STATUS:  Full  TOTAL TIME TAKING CARE OF THIS PATIENT:  45 minutes.    Henreitta Leber M.D on 07/24/2014 at 7:19 PM  Between 7am to 6pm - Pager - [phone]  After 6pm go to [email or web address] - password [email or web address] [facility]  [place] [facility]  Office  [phone]  CC: Primary care Physician: Dion Body, MD

## 2014-07-24 NOTE — ED Provider Notes (Signed)
[facility] Emergency Department Provider Note  ____________________________________________  Time seen: Approximately 4:02 PM  I have reviewed the triage vital signs and the nursing notes.   HISTORY  Chief Complaint Leg Swelling    HPI Margaret Matthews is a 81 y.o. female who was discharged yesterday due to hypovolemia secondary to diarrhea and colitis. She reports that since going home she has had about 3 or 4 ongoing loose stools, but as they are better than when she was admitted. She had noticed this morning that her feet look swollen area she reports she is not sure if she is drinking enough for 2 much water. She has had 2 large Gatorade's, 2 pitchers of water, and coffee today. She is eating well. She does feel slightly tired.  She denies pain. No shortness of breath. No difficulty walking or trouble breathing. Does have swelling in her feet only, none in the ankles or legs. No fevers or chills. No bloody bowel movements.  Son is with her and reports no confusion. Patient has been acting well.  Past Medical History  Diagnosis Date  . Hypertension   . Renal disorder   . Arthritis   . CKD (chronic kidney disease) stage 2, GFR 60-89 ml/min     baseline creatinine 1.5  . Renal cell carcinoma   . Breast cancer   . Anemia   . Arthritis   . Osteopenia   . GERD (gastroesophageal reflux disease)   . PUD (peptic ulcer disease)   . Glaucoma     Patient Active Problem List   Diagnosis Date Noted  . ARF (acute renal failure) 07/20/2014    Past Surgical History  Procedure Laterality Date  . Breast surgery      Left mastectomy  . Nephrectomy      Right nephrectomy  . Hemiarthroplasty shoulder fracture      Right shoulder  . Hip surgery      Left hip  . Colonoscopy      Current Outpatient Rx  Name  Route  Sig  Dispense  Refill  . acidophilus (RISAQUAD) CAPS capsule   Oral   Take 1 capsule by mouth 2 (two) times daily.   15 capsule   0    Can be substituted to any equivalent   . aspirin EC 81 MG tablet   Oral   Take 81 mg by mouth daily.         . beta carotene w/minerals (OCUVITE) tablet   Oral   Take 1 tablet by mouth every evening.         . Calcium Carbonate-Vit D-Min (CALCIUM 600+D PLUS MINERALS PO)   Oral   Take 1 tablet by mouth 2 (two) times daily.          . carvedilol (COREG) 3.125 MG tablet   Oral   Take 6.25 mg by mouth 2 (two) times daily with a meal.         . CRANBERRY PO   Oral   Take 1 capsule by mouth every evening.         . dorzolamide (TRUSOPT) 2 % ophthalmic solution   Both Eyes   Place 1 drop into both eyes daily.         . enalapril (VASOTEC) 5 MG tablet   Oral   Take 1 tablet (5 mg total) by mouth 2 (two) times daily.   60 tablet   0   . hydrochlorothiazide (MICROZIDE) 12.5 MG capsule   Oral  Take 25 mg by mouth daily.         . hydroxychloroquine (PLAQUENIL) 200 MG tablet   Oral   Take 200 mg by mouth daily.         Marland Kitchen IRON-VITAMIN C PO   Oral   Take 1 tablet by mouth every evening.         . latanoprost (XALATAN) 0.005 % ophthalmic solution   Both Eyes   Place 1 drop into both eyes at bedtime.         . Loperamide HCl (IMODIUM PO)   Oral   Take 1 tablet by mouth daily as needed (for diarrhea).          . mesalamine (PENTASA) 250 MG CR capsule   Oral   Take 4 capsules (1,000 mg total) by mouth 2 (two) times daily.   30 capsule   2   . metroNIDAZOLE (FLAGYL) 500 MG tablet   Oral   Take 1 tablet (500 mg total) by mouth every 8 (eight) hours.   20 tablet   0   . pantoprazole (PROTONIX) 40 MG tablet   Oral   Take 40 mg by mouth daily.         . pravastatin (PRAVACHOL) 20 MG tablet   Oral   Take 20 mg by mouth every evening.           Allergies Statins  Family History  Problem Relation Age of Onset  . CAD Mother   . CVA Father   . CAD Sister     Social History History  Substance Use Topics  . Smoking status: Former  Research scientist (life sciences)  . Smokeless tobacco: Not on file  . Alcohol Use: No    Review of Systems Constitutional: No fever/chills Eyes: No visual changes. ENT: No sore throat. Cardiovascular: Denies chest pain. Respiratory: Denies shortness of breath. Gastrointestinal: No abdominal pain.  No nausea, no vomiting.   No constipation. Genitourinary: Negative for dysuria. Musculoskeletal: Negative for back pain. Skin: Negative for rash. Neurological: Negative for headaches, focal weakness or numbness.  10-point ROS otherwise negative.  ____________________________________________   PHYSICAL EXAM:  VITAL SIGNS: ED Triage Vitals  Enc Vitals Group     BP 07/24/14 1250 179/65 mmHg     Pulse Rate 07/24/14 1250 69     Resp 07/24/14 1250 18     Temp 07/24/14 1250 97.7 F (36.5 C)     Temp Source 07/24/14 1250 Oral     SpO2 07/24/14 1250 100 %     Weight 07/24/14 1250 109 lb (49.442 kg)     Height 07/24/14 1250 5\' 2"  (1.575 m)     Head Cir --      Peak Flow --      Pain Score --      Pain Loc --      Pain Edu? --      Excl. in [place]? --     Constitutional: Alert and oriented. Well appearing and in no acute distress. Eyes: Conjunctivae are normal. PERRL. EOMI. Head: Atraumatic. Nose: No congestion/rhinnorhea. Mouth/Throat: Mucous membranes are moist.  Oropharynx non-erythematous. Neck: No stridor.   Cardiovascular: Normal rate, regular rhythm. Grossly normal heart sounds.  Good peripheral circulation. Respiratory: Normal respiratory effort.  No retractions. Lungs CTAB. Gastrointestinal: Soft and nontender. No distention. No abdominal bruits. No CVA tenderness. Musculoskeletal: No lower extremity tenderness and there is 1+ lower extremity edema bilateral.  No joint effusions. Neurologic:  Normal speech and language. No gross focal neurologic  deficits are appreciated. Speech is normal.  Skin:  Skin is warm, dry and intact. No rash noted. Psychiatric: Mood and affect are normal. Speech and  behavior are normal.  ____________________________________________   LABS (all labs ordered are listed, but only abnormal results are displayed)  Labs Reviewed  CBC - Abnormal; Notable for the following:    RBC 3.31 (*)    Hemoglobin 9.7 (*)    HCT 29.5 (*)    All other components within normal limits  COMPREHENSIVE METABOLIC PANEL - Abnormal; Notable for the following:    Sodium 127 (*)    CO2 21 (*)    Glucose, Bld 101 (*)    Creatinine, Ser 1.08 (*)    Calcium 8.8 (*)    Total Protein 6.2 (*)    Albumin 3.3 (*)    ALT 13 (*)    GFR calc non Af Amer 46 (*)    GFR calc Af Amer 54 (*)    Anion gap 4 (*)    All other components within normal limits  BRAIN NATRIURETIC PEPTIDE - Abnormal; Notable for the following:    B Natriuretic Peptide 594.0 (*)    All other components within normal limits  SODIUM - Abnormal; Notable for the following:    Sodium 130 (*)    All other components within normal limits   ____________________________________________  EKG  ____________________________________________  RADIOLOGY   ____________________________________________   PROCEDURES  Procedure(s) performed: None  Critical Care performed: No  ____________________________________________   INITIAL IMPRESSION / ASSESSMENT AND PLAN / ED COURSE  Pertinent labs & imaging results that were available during my care of the patient were reviewed by me and considered in my medical decision making (see chart for details).  Patient presents because of swelling in the feet. Also slight fatigue. Of note, patient's sodium has dropped by several points over the last 1 day. Now 127. She has been hydrating very well at home, and though her exam seems relatively euvolemic she does have some minimal edema in her legs. I suspect she is probably over hydrating, but she does also report having some diarrhea. This makes it very difficult to discern her true etiology and volume status.  She is awake  and alert and very stable appearing. She has no acute cardiopulmonary symptoms. Her lungs are clear. No evidence of overt congestive heart failure. Will obtain a chest x-ray, her BNP is elevated.  I have asked the hospitalist service to consult on the patient for further treatment recommendations and she was discharged from her service yesterday, and they be be able to assist with further treatment recommendations. ____________________________________________ ----------------------------------------- 7:01 PM on 07/24/2014 -----------------------------------------  Patient remained stable in no distress. Sodium risen to 130 after half liter of fluid. Patient was seen in consultation by medical service Dr. Mindi Slicker, who recommended patient be discharged with no medication changes. Follow up with Dr. Netty Starring  Discussed with patient and her son, both are agreeable with plan. We'll discharge her home to continue on her current medications, advised her to follow a normal hydration pattern and not to use excessive amounts of water. Namely she does not need to be drinking more than 1 large pitcher, or equivalent to about 8 cups of water a day. Patient and son agreeable. They will follow-up with Dr. Carlean Jews early this week. Return precautions advised.  FINAL CLINICAL IMPRESSION(S) / ED DIAGNOSES  Final diagnoses:  Hyponatremia  Frequent loose stools      Delman Kitten, MD 07/24/14 1902

## 2014-08-03 ENCOUNTER — Other Ambulatory Visit
Admission: RE | Admit: 2014-08-03 | Discharge: 2014-08-03 | Disposition: A | Discharge status: Home / Self Care | Payer: Medicare PPO | Source: Other Acute Inpatient Hospital | Attending: Unknown Physician Specialty | Admitting: Unknown Physician Specialty

## 2014-08-03 DIAGNOSIS — R197 Diarrhea, unspecified: Secondary | ICD-10-CM | POA: Diagnosis present

## 2014-08-03 LAB — C DIFFICILE QUICK SCREEN W PCR REFLEX
C DIFFICILE (CDIFF) INTERP: NEGATIVE
C DIFFICILE (CDIFF) TOXIN: NEGATIVE
C DIFFICLE (CDIFF) ANTIGEN: NEGATIVE

## 2015-05-08 ENCOUNTER — Inpatient Hospital Stay
Admission: EM | Admit: 2015-05-08 | Discharge: 2015-05-12 | DRG: 291 | Disposition: A | Discharge status: Skilled Nursing Facility | Payer: Medicare Other | Attending: Internal Medicine | Admitting: Internal Medicine

## 2015-05-08 ENCOUNTER — Inpatient Hospital Stay
Admit: 2015-05-08 | Discharge: 2015-05-08 | Disposition: A | Discharge status: Home / Self Care | Payer: Medicare Other | Attending: Adult Health | Admitting: Adult Health

## 2015-05-08 ENCOUNTER — Emergency Department: Payer: Medicare Other

## 2015-05-08 ENCOUNTER — Encounter: Payer: Self-pay | Admitting: *Deleted

## 2015-05-08 DIAGNOSIS — N182 Chronic kidney disease, stage 2 (mild): Secondary | ICD-10-CM | POA: Diagnosis present

## 2015-05-08 DIAGNOSIS — I130 Hypertensive heart and chronic kidney disease with heart failure and stage 1 through stage 4 chronic kidney disease, or unspecified chronic kidney disease: Secondary | ICD-10-CM | POA: Diagnosis present

## 2015-05-08 DIAGNOSIS — R7989 Other specified abnormal findings of blood chemistry: Secondary | ICD-10-CM

## 2015-05-08 DIAGNOSIS — R0603 Acute respiratory distress: Secondary | ICD-10-CM

## 2015-05-08 DIAGNOSIS — N3001 Acute cystitis with hematuria: Secondary | ICD-10-CM | POA: Diagnosis present

## 2015-05-08 DIAGNOSIS — I350 Nonrheumatic aortic (valve) stenosis: Secondary | ICD-10-CM | POA: Diagnosis present

## 2015-05-08 DIAGNOSIS — E1165 Type 2 diabetes mellitus with hyperglycemia: Secondary | ICD-10-CM | POA: Diagnosis present

## 2015-05-08 DIAGNOSIS — Z87891 Personal history of nicotine dependence: Secondary | ICD-10-CM | POA: Diagnosis not present

## 2015-05-08 DIAGNOSIS — Z7982 Long term (current) use of aspirin: Secondary | ICD-10-CM

## 2015-05-08 DIAGNOSIS — J810 Acute pulmonary edema: Secondary | ICD-10-CM

## 2015-05-08 DIAGNOSIS — I509 Heart failure, unspecified: Secondary | ICD-10-CM

## 2015-05-08 DIAGNOSIS — Z905 Acquired absence of kidney: Secondary | ICD-10-CM | POA: Diagnosis not present

## 2015-05-08 DIAGNOSIS — Z853 Personal history of malignant neoplasm of breast: Secondary | ICD-10-CM

## 2015-05-08 DIAGNOSIS — E1122 Type 2 diabetes mellitus with diabetic chronic kidney disease: Secondary | ICD-10-CM | POA: Diagnosis present

## 2015-05-08 DIAGNOSIS — R0902 Hypoxemia: Secondary | ICD-10-CM

## 2015-05-08 DIAGNOSIS — R011 Cardiac murmur, unspecified: Secondary | ICD-10-CM

## 2015-05-08 DIAGNOSIS — Z823 Family history of stroke: Secondary | ICD-10-CM | POA: Diagnosis not present

## 2015-05-08 DIAGNOSIS — M8580 Other specified disorders of bone density and structure, unspecified site: Secondary | ICD-10-CM | POA: Diagnosis present

## 2015-05-08 DIAGNOSIS — Z8249 Family history of ischemic heart disease and other diseases of the circulatory system: Secondary | ICD-10-CM | POA: Diagnosis not present

## 2015-05-08 DIAGNOSIS — J9601 Acute respiratory failure with hypoxia: Secondary | ICD-10-CM

## 2015-05-08 DIAGNOSIS — H409 Unspecified glaucoma: Secondary | ICD-10-CM | POA: Diagnosis present

## 2015-05-08 DIAGNOSIS — R0600 Dyspnea, unspecified: Secondary | ICD-10-CM | POA: Diagnosis present

## 2015-05-08 DIAGNOSIS — Z8711 Personal history of peptic ulcer disease: Secondary | ICD-10-CM | POA: Diagnosis not present

## 2015-05-08 DIAGNOSIS — R778 Other specified abnormalities of plasma proteins: Secondary | ICD-10-CM

## 2015-05-08 DIAGNOSIS — Z85528 Personal history of other malignant neoplasm of kidney: Secondary | ICD-10-CM

## 2015-05-08 DIAGNOSIS — Z79899 Other long term (current) drug therapy: Secondary | ICD-10-CM

## 2015-05-08 DIAGNOSIS — B961 Klebsiella pneumoniae [K. pneumoniae] as the cause of diseases classified elsewhere: Secondary | ICD-10-CM | POA: Diagnosis present

## 2015-05-08 DIAGNOSIS — K219 Gastro-esophageal reflux disease without esophagitis: Secondary | ICD-10-CM | POA: Diagnosis present

## 2015-05-08 DIAGNOSIS — E785 Hyperlipidemia, unspecified: Secondary | ICD-10-CM | POA: Diagnosis present

## 2015-05-08 DIAGNOSIS — I5033 Acute on chronic diastolic (congestive) heart failure: Secondary | ICD-10-CM | POA: Diagnosis present

## 2015-05-08 DIAGNOSIS — I5021 Acute systolic (congestive) heart failure: Secondary | ICD-10-CM | POA: Diagnosis not present

## 2015-05-08 LAB — URINALYSIS COMPLETE WITH MICROSCOPIC (ARMC ONLY)
BILIRUBIN URINE: NEGATIVE
GLUCOSE, UA: NEGATIVE mg/dL
Hgb urine dipstick: NEGATIVE
KETONES UR: NEGATIVE mg/dL
NITRITE: NEGATIVE
Protein, ur: 100 mg/dL — AB
SPECIFIC GRAVITY, URINE: 1.011 (ref 1.005–1.030)
Squamous Epithelial / LPF: NONE SEEN
pH: 5 (ref 5.0–8.0)

## 2015-05-08 LAB — CBC WITH DIFFERENTIAL/PLATELET
Basophils Absolute: 0.1 K/uL (ref 0–0.1)
Basophils Relative: 1 %
Eosinophils Absolute: 0.1 K/uL (ref 0–0.7)
Eosinophils Relative: 1 %
HCT: 36.2 % (ref 35.0–47.0)
Hemoglobin: 11.9 g/dL — ABNORMAL LOW (ref 12.0–16.0)
Lymphocytes Relative: 10 %
Lymphs Abs: 1.4 K/uL (ref 1.0–3.6)
MCH: 29.1 pg (ref 26.0–34.0)
MCHC: 32.9 g/dL (ref 32.0–36.0)
MCV: 88.5 fL (ref 80.0–100.0)
Monocytes Absolute: 1 K/uL — ABNORMAL HIGH (ref 0.2–0.9)
Monocytes Relative: 7 %
Neutro Abs: 11.5 K/uL — ABNORMAL HIGH (ref 1.4–6.5)
Neutrophils Relative %: 81 %
Platelets: 259 K/uL (ref 150–440)
RBC: 4.08 MIL/uL (ref 3.80–5.20)
RDW: 14.6 % — ABNORMAL HIGH (ref 11.5–14.5)
WBC: 14.1 K/uL — ABNORMAL HIGH (ref 3.6–11.0)

## 2015-05-08 LAB — PROCALCITONIN: PROCALCITONIN: 0.3 ng/mL

## 2015-05-08 LAB — BASIC METABOLIC PANEL
ANION GAP: 7 (ref 5–15)
BUN: 18 mg/dL (ref 6–20)
CALCIUM: 8.9 mg/dL (ref 8.9–10.3)
CO2: 28 mmol/L (ref 22–32)
Chloride: 99 mmol/L — ABNORMAL LOW (ref 101–111)
Creatinine, Ser: 0.85 mg/dL (ref 0.44–1.00)
Glucose, Bld: 225 mg/dL — ABNORMAL HIGH (ref 65–99)
Potassium: 3.8 mmol/L (ref 3.5–5.1)
Sodium: 134 mmol/L — ABNORMAL LOW (ref 135–145)

## 2015-05-08 LAB — COMPREHENSIVE METABOLIC PANEL
ALBUMIN: 3.5 g/dL (ref 3.5–5.0)
ALK PHOS: 98 U/L (ref 38–126)
ALT: 20 U/L (ref 14–54)
AST: 29 U/L (ref 15–41)
Anion gap: 6 (ref 5–15)
BILIRUBIN TOTAL: 0.3 mg/dL (ref 0.3–1.2)
BUN: 18 mg/dL (ref 6–20)
CO2: 25 mmol/L (ref 22–32)
Calcium: 8.8 mg/dL — ABNORMAL LOW (ref 8.9–10.3)
Chloride: 105 mmol/L (ref 101–111)
Creatinine, Ser: 0.92 mg/dL (ref 0.44–1.00)
GFR calc Af Amer: >60 mL/min (ref >60)
GFR calc non Af Amer: 56 mL/min — ABNORMAL LOW (ref >60)
GLUCOSE: 291 mg/dL — AB (ref 65–99)
POTASSIUM: 3.3 mmol/L — AB (ref 3.5–5.1)
Sodium: 136 mmol/L (ref 135–145)
TOTAL PROTEIN: 6.9 g/dL (ref 6.5–8.1)

## 2015-05-08 LAB — BLOOD GAS, ARTERIAL
ACID-BASE DEFICIT: 0.5 mmol/L (ref 0.0–2.0)
Acid-base deficit: 2.3 mmol/L — ABNORMAL HIGH (ref 0.0–2.0)
Allens test (pass/fail): POSITIVE — AB
Bicarbonate: 28.6 meq/L — ABNORMAL HIGH (ref 21.0–28.0)
Bicarbonate: 29.2 mEq/L — ABNORMAL HIGH (ref 21.0–28.0)
FIO2: 1
FIO2: 0.44
O2 Saturation: 81.7 %
O2 Saturation: 85.1 %
PCO2 ART: 73 mmHg — AB (ref 32.0–48.0)
Patient temperature: 37
Patient temperature: 37
pCO2 arterial: 86 mmHg (ref 32.0–48.0)
pH, Arterial: 7.13 — CL (ref 7.350–7.450)
pH, Arterial: 7.21 — ABNORMAL LOW (ref 7.350–7.450)
pO2, Arterial: 61 mmHg — ABNORMAL LOW (ref 83.0–108.0)
pO2, Arterial: 61 mmHg — ABNORMAL LOW (ref 83.0–108.0)

## 2015-05-08 LAB — LACTIC ACID, PLASMA
Lactic Acid, Venous: 1.5 mmol/L (ref 0.5–2.0)
Lactic Acid, Venous: 0.8 mmol/L (ref 0.5–2.0)

## 2015-05-08 LAB — FIBRIN DERIVATIVES D-DIMER (ARMC ONLY): Fibrin derivatives D-dimer (ARMC): 1803 — ABNORMAL HIGH (ref 0–499)

## 2015-05-08 LAB — GLUCOSE, CAPILLARY
GLUCOSE-CAPILLARY: 246 mg/dL — AB (ref 65–99)
Glucose-Capillary: 215 mg/dL — ABNORMAL HIGH (ref 65–99)

## 2015-05-08 LAB — TROPONIN I
TROPONIN I: 0.16 ng/mL — AB (ref <0.031)
TROPONIN I: 0.32 ng/mL — AB (ref <0.031)
Troponin I: 0.04 ng/mL — ABNORMAL HIGH (ref <0.031)
Troponin I: 0.34 ng/mL — ABNORMAL HIGH (ref <0.031)

## 2015-05-08 LAB — MRSA PCR SCREENING: MRSA by PCR: NEGATIVE

## 2015-05-08 LAB — PHOSPHORUS: PHOSPHORUS: 4.3 mg/dL (ref 2.5–4.6)

## 2015-05-08 LAB — MAGNESIUM: MAGNESIUM: 1.6 mg/dL — AB (ref 1.7–2.4)

## 2015-05-08 LAB — BRAIN NATRIURETIC PEPTIDE: B Natriuretic Peptide: 1278 pg/mL — ABNORMAL HIGH (ref 0.0–100.0)

## 2015-05-08 MED ORDER — LORAZEPAM 2 MG/ML IJ SOLN
0.5000 mg | Freq: Once | INTRAMUSCULAR | Status: AC
Start: 2015-05-08 — End: 2015-05-08
  Administered 2015-05-08: 0.5 mg via INTRAVENOUS
  Filled 2015-05-08: qty 1

## 2015-05-08 MED ORDER — ENSURE ENLIVE PO LIQD
237.0000 mL | Freq: Three times a day (TID) | ORAL | Status: DC
  Administered 2015-05-09 – 2015-05-12 (×9): 237 mL via ORAL

## 2015-05-08 MED ORDER — PANTOPRAZOLE SODIUM 40 MG IV SOLR: 40.0000 mg | Freq: Every day | INTRAVENOUS | Status: DC

## 2015-05-08 MED ORDER — MAGNESIUM SULFATE 2 GM/50ML IV SOLN
2.0000 g | Freq: Once | INTRAVENOUS | Status: AC
  Administered 2015-05-08: 2 g via INTRAVENOUS
  Filled 2015-05-08: qty 50

## 2015-05-08 MED ORDER — FUROSEMIDE 10 MG/ML IJ SOLN: 20.0000 mg | Freq: Once | INTRAMUSCULAR | Status: DC

## 2015-05-08 MED ORDER — FUROSEMIDE 10 MG/ML IJ SOLN
40.0000 mg | Freq: Once | INTRAMUSCULAR | Status: AC
  Administered 2015-05-08: 40 mg via INTRAVENOUS
  Filled 2015-05-08: qty 4

## 2015-05-08 MED ORDER — ENOXAPARIN SODIUM 40 MG/0.4ML ~~LOC~~ SOLN: 40.0000 mg | SUBCUTANEOUS | Status: DC

## 2015-05-08 MED ORDER — ENOXAPARIN SODIUM 40 MG/0.4ML ~~LOC~~ SOLN
40.0000 mg | SUBCUTANEOUS | Status: DC
  Administered 2015-05-08: 40 mg via SUBCUTANEOUS
  Filled 2015-05-08: qty 0.4

## 2015-05-08 MED ORDER — CARVEDILOL 6.25 MG PO TABS
3.1250 mg | ORAL_TABLET | Freq: Two times a day (BID) | ORAL | Status: DC
  Administered 2015-05-08 – 2015-05-12 (×8): 3.125 mg via ORAL
  Filled 2015-05-08 (×8): qty 1

## 2015-05-08 MED ORDER — NITROGLYCERIN IN D5W 200-5 MCG/ML-% IV SOLN
0.0000 ug/min | INTRAVENOUS | Status: DC
  Administered 2015-05-08: 5 ug/min via INTRAVENOUS
  Filled 2015-05-08: qty 250

## 2015-05-08 MED ORDER — BUMETANIDE 0.25 MG/ML IJ SOLN
1.0000 mg | Freq: Once | INTRAMUSCULAR | Status: AC
  Administered 2015-05-08: 1 mg via INTRAVENOUS
  Filled 2015-05-08: qty 4

## 2015-05-08 MED ORDER — ONDANSETRON HCL 4 MG/2ML IJ SOLN
4.0000 mg | Freq: Four times a day (QID) | INTRAMUSCULAR | Status: DC | PRN
  Administered 2015-05-08: 4 mg via INTRAVENOUS
  Filled 2015-05-08: qty 2

## 2015-05-08 MED ORDER — LEVOFLOXACIN IN D5W 750 MG/150ML IV SOLN
750.0000 mg | Freq: Once | INTRAVENOUS | Status: AC
  Administered 2015-05-08: 750 mg via INTRAVENOUS
  Filled 2015-05-08: qty 150

## 2015-05-08 MED ORDER — INSULIN ASPART 100 UNIT/ML ~~LOC~~ SOLN
0.0000 [IU] | Freq: Four times a day (QID) | SUBCUTANEOUS | Status: DC
  Administered 2015-05-09: 3 [IU] via SUBCUTANEOUS
  Administered 2015-05-09: 2 [IU] via SUBCUTANEOUS
  Administered 2015-05-10: 12:00:00 3 [IU] via SUBCUTANEOUS
  Filled 2015-05-08: qty 3
  Filled 2015-05-08: qty 2
  Filled 2015-05-08: qty 3

## 2015-05-08 MED ORDER — INSULIN ASPART 100 UNIT/ML ~~LOC~~ SOLN: 0.0000 [IU] | SUBCUTANEOUS | Status: DC

## 2015-05-08 MED ORDER — ENALAPRIL MALEATE 5 MG PO TABS
5.0000 mg | ORAL_TABLET | Freq: Two times a day (BID) | ORAL | Status: DC
  Administered 2015-05-09 – 2015-05-10 (×2): 5 mg via ORAL
  Filled 2015-05-08 (×2): qty 1

## 2015-05-08 MED ORDER — SODIUM CHLORIDE 0.9 % IV SOLN: 250.0000 mL | INTRAVENOUS | Status: DC | PRN

## 2015-05-08 MED ORDER — ENALAPRIL MALEATE 5 MG PO TABS: 5.0000 mg | ORAL_TABLET | Freq: Once | ORAL | Status: DC

## 2015-05-08 MED ORDER — LEVOFLOXACIN IN D5W 750 MG/150ML IV SOLN: 750.0000 mg | INTRAVENOUS | Status: DC

## 2015-05-08 MED ORDER — ALBUTEROL SULFATE (2.5 MG/3ML) 0.083% IN NEBU
2.5000 mg | INHALATION_SOLUTION | RESPIRATORY_TRACT | Status: DC | PRN
  Administered 2015-05-08: 2.5 mg via RESPIRATORY_TRACT
  Filled 2015-05-08: qty 3

## 2015-05-08 MED ORDER — ONDANSETRON HCL 4 MG/2ML IJ SOLN
4.0000 mg | Freq: Once | INTRAMUSCULAR | Status: AC
  Administered 2015-05-08: 4 mg via INTRAVENOUS
  Filled 2015-05-08: qty 2

## 2015-05-08 MED ORDER — ASPIRIN EC 81 MG PO TBEC
81.0000 mg | DELAYED_RELEASE_TABLET | Freq: Every day | ORAL | Status: DC
  Administered 2015-05-08 – 2015-05-12 (×5): 81 mg via ORAL
  Filled 2015-05-08 (×5): qty 1

## 2015-05-08 MED ORDER — LORAZEPAM 2 MG/ML IJ SOLN
0.5000 mg | Freq: Three times a day (TID) | INTRAMUSCULAR | Status: DC | PRN
  Administered 2015-05-08: 0.5 mg via INTRAVENOUS
  Filled 2015-05-08: qty 1

## 2015-05-08 MED ORDER — POTASSIUM CHLORIDE CRYS ER 20 MEQ PO TBCR
40.0000 meq | EXTENDED_RELEASE_TABLET | Freq: Two times a day (BID) | ORAL | Status: AC
  Administered 2015-05-08 (×2): 40 meq via ORAL
  Filled 2015-05-08 (×2): qty 2

## 2015-05-08 NOTE — Progress Notes (Signed)
Patients troponin levels elevated. Per Dr Rogue Jury no new orders given at this time. MD does not want a cardiology order. Patient is not having any sob or chest pain.

## 2015-05-08 NOTE — Progress Notes (Signed)
Pharmacy Antibiotic Note  Margaret Matthews is a 82 y.o. female admitted on 05/08/2015 with pneumonia.  Pharmacy has been consulted for CAP dosing.  Plan: Levofloxacin 750 mg IV Q48H for CrCl approximately 35 mL/min.  Height: 5\' 1"  (154.9 cm) Weight: 110 lb (49.896 kg) IBW/kg (Calculated) : 47.8  Temp (24hrs), Avg:97.9 F (36.6 C), Min:97.9 F (36.6 C), Max:97.9 F (36.6 C)   Recent Labs Lab 05/08/15 0433 05/08/15 0434  WBC  --  14.1*  CREATININE  --  0.92  LATICACIDVEN 1.5  --     Estimated Creatinine Clearance: 35 mL/min (by C-G formula based on Cr of 0.92).    Allergies  Allergen Reactions  . Statins Other (See Comments)    Reaction: Pt says "it was showing up in her liver."     Thank you for allowing pharmacy to be a part of this patient's care.  Laural Benes, Pharm.D., BCPS Clinical Pharmacist 05/08/2015 7:13 AM

## 2015-05-08 NOTE — Progress Notes (Signed)
Patient placed on bipap per order from Dr. Beather Arbour. Settings verified with md. See flowsheet. Patient tolerated fairly well.

## 2015-05-08 NOTE — ED Provider Notes (Signed)
[facility] Emergency Department Provider Note  ____________________________________________  Time seen: Approximately 4:25 AM  I have reviewed the triage vital signs and the nursing notes.   HISTORY  Chief Complaint Shortness of Breath and Nausea    HPI GERALYNN Matthews is a 82 y.o. female who presents to the ED from home with a chief complaint of shortness of breath. Patient complains of a 2-3 day history of progressive shortness of breath which worsened this morning. She has a history of CKD stage II, hypertension. Denies prior history of CHF or COPD. Denies recent fever, chills, chest pain, abdominal pain. Family reports she has had some trouble with vertigo with associated nausea and also diarrhea. Denies recent travel or trauma. Nothing makes her symptoms better or worse.   Past Medical History  Diagnosis Date  . Hypertension   . Renal disorder   . Arthritis   . CKD (chronic kidney disease) stage 2, GFR 60-89 ml/min     baseline creatinine 1.5  . Renal cell carcinoma ([place])   . Breast cancer ([place])   . Anemia   . Arthritis   . Osteopenia   . GERD (gastroesophageal reflux disease)   . PUD (peptic ulcer disease)   . Glaucoma     Patient Active Problem List   Diagnosis Date Noted  . CHF (congestive heart failure) ([place]) 05/08/2015  . ARF (acute renal failure) ([place]) 07/20/2014    Past Surgical History  Procedure Laterality Date  . Breast surgery      Left mastectomy  . Nephrectomy      Right nephrectomy  . Hemiarthroplasty shoulder fracture      Right shoulder  . Hip surgery      Left hip  . Colonoscopy      Current Outpatient Rx  Name  Route  Sig  Dispense  Refill  . acidophilus (RISAQUAD) CAPS capsule   Oral   Take 1 capsule by mouth 2 (two) times daily.   15 capsule   0     Can be substituted to any equivalent   . aspirin EC 81 MG tablet   Oral   Take 81 mg by mouth daily.         . beta carotene w/minerals (OCUVITE)  tablet   Oral   Take 1 tablet by mouth every evening.         . Calcium Carbonate-Vit D-Min (CALCIUM 600+D PLUS MINERALS PO)   Oral   Take 1 tablet by mouth 2 (two) times daily.          Marland Kitchen CRANBERRY PO   Oral   Take 1 capsule by mouth every evening.         . enalapril (VASOTEC) 5 MG tablet   Oral   Take 1 tablet (5 mg total) by mouth 2 (two) times daily.   60 tablet   0   . hydrochlorothiazide (MICROZIDE) 12.5 MG capsule   Oral   Take 25 mg by mouth daily.         . hydroxychloroquine (PLAQUENIL) 200 MG tablet   Oral   Take 200 mg by mouth daily.         Marland Kitchen IRON-VITAMIN C PO   Oral   Take 1 tablet by mouth every evening.         . Loperamide HCl (IMODIUM PO)   Oral   Take 1 tablet by mouth daily as needed (for diarrhea).          Marland Kitchen  mesalamine (PENTASA) 250 MG CR capsule   Oral   Take 4 capsules (1,000 mg total) by mouth 2 (two) times daily.   30 capsule   2   . metroNIDAZOLE (FLAGYL) 500 MG tablet   Oral   Take 1 tablet (500 mg total) by mouth every 8 (eight) hours.   20 tablet   0   . pravastatin (PRAVACHOL) 20 MG tablet   Oral   Take 20 mg by mouth every evening.           Allergies Statins  Family History  Problem Relation Age of Onset  . CAD Mother   . CVA Father   . CAD Sister     Social History Social History  Substance Use Topics  . Smoking status: Former Research scientist (life sciences)  . Smokeless tobacco: None  . Alcohol Use: No    Review of Systems  Constitutional: No fever/chills. Eyes: No visual changes. ENT: No sore throat. Cardiovascular: Denies chest pain. Respiratory: Positive for shortness of breath. Gastrointestinal: No abdominal pain.  Positive for nausea, no vomiting.  Positive for diarrhea.  No constipation. Genitourinary: Negative for dysuria. Musculoskeletal: Negative for back pain. Skin: Negative for rash. Neurological: Negative for headaches, focal weakness or numbness.  10-point ROS otherwise  negative.  ____________________________________________   PHYSICAL EXAM:  VITAL SIGNS: ED Triage Vitals  Enc Vitals Group     BP --      Pulse Rate 05/08/15 0419 86     Resp 05/08/15 0419 35     Temp --      Temp src --      SpO2 05/08/15 0419 89 %     Weight 05/08/15 0419 110 lb (49.896 kg)     Height 05/08/15 0419 5\' 1"  (1.549 m)     Head Cir --      Peak Flow --      Pain Score --      Pain Loc --      Pain Edu? --      Excl. in [address]? --     Constitutional: Alert and oriented. Ill-appearing and in moderate acute distress. Eyes: Conjunctivae are normal. PERRL. EOMI. Head: Atraumatic. Nose: No congestion/rhinnorhea. Mouth/Throat: Mucous membranes are moist.  Oropharynx non-erythematous. Neck: No stridor.   Cardiovascular: Normal rate, regular rhythm. III/VI SEM.  Good peripheral circulation. Respiratory: Increased respiratory effort.  No retractions. Lungs with audible rales. Gastrointestinal: Soft and nontender. No distention. No abdominal bruits. No CVA tenderness. Musculoskeletal: No lower extremity tenderness nor edema.  No joint effusions. Neurologic:  Normal speech and language. No gross focal neurologic deficits are appreciated.  Skin:  Skin is warm, dry and intact. No rash noted. Psychiatric: Mood and affect are normal. Speech and behavior are normal.  ____________________________________________   LABS (all labs ordered are listed, but only abnormal results are displayed)  Labs Reviewed  CBC WITH DIFFERENTIAL/PLATELET - Abnormal; Notable for the following:    WBC 14.1 (*)    Hemoglobin 11.9 (*)    RDW 14.6 (*)    Neutro Abs 11.5 (*)    Monocytes Absolute 1.0 (*)    All other components within normal limits  COMPREHENSIVE METABOLIC PANEL - Abnormal; Notable for the following:    Potassium 3.3 (*)    Glucose, Bld 291 (*)    Calcium 8.8 (*)    GFR calc non Af Amer 56 (*)    All other components within normal limits  TROPONIN I - Abnormal; Notable for  the following:  Troponin I 0.04 (*)    All other components within normal limits  BRAIN NATRIURETIC PEPTIDE - Abnormal; Notable for the following:    B Natriuretic Peptide 1278.0 (*)    All other components within normal limits  BLOOD GAS, ARTERIAL - Abnormal; Notable for the following:    pH, Arterial 7.13 (*)    pCO2 arterial 86 (*)    pO2, Arterial 61 (*)    Bicarbonate 28.6 (*)    Acid-base deficit 2.3 (*)    All other components within normal limits  CULTURE, BLOOD (ROUTINE X 2)  CULTURE, BLOOD (ROUTINE X 2)  URINE CULTURE  LACTIC ACID, PLASMA  LACTIC ACID, PLASMA  TROPONIN I  TROPONIN I  TROPONIN I  FIBRIN DERIVATIVES D-DIMER ([facility] ONLY)  BLOOD GAS, ARTERIAL   ____________________________________________  EKG  ED ECG REPORT I, SUNG,JADE J, the attending physician, personally viewed and interpreted this ECG.   Date: 05/08/2015  EKG Time: 0421  Rate: 86  Rhythm: normal EKG, normal sinus rhythm  Axis: Normal  Intervals:none  ST&T Change: Nonspecific  ____________________________________________  RADIOLOGY  Portable CXR (viewed by me, interpreted per Dr. Radene Knee): Vascular congestion. Increased interstitial markings are compatible with pulmonary edema. Superimposed pneumonia cannot be excluded. ____________________________________________   PROCEDURES  Procedure(s) performed: None  Critical Care performed:  CRITICAL CARE Performed by: Paulette Blanch   Total critical care time: 30 minutes  Critical care time was exclusive of separately billable procedures and treating other patients.  Critical care was necessary to treat or prevent imminent or life-threatening deterioration.  Critical care was time spent personally by me on the following activities: development of treatment plan with patient and/or surrogate as well as nursing, discussions with consultants, evaluation of patient's response to treatment, examination of patient, obtaining history from  patient or surrogate, ordering and performing treatments and interventions, ordering and review of laboratory studies, ordering and review of radiographic studies, pulse oximetry and re-evaluation of patient's condition. ____________________________________________   INITIAL IMPRESSION / ASSESSMENT AND PLAN / ED COURSE  Pertinent labs & imaging results that were available during my care of the patient were reviewed by me and considered in my medical decision making (see chart for details).  82 year old female who presents in moderate respiratory distress with audible rales. History of valvular insufficiency with audible murmur. Likely first time presentation of CHF. Will obtain screening lab work including BNP, troponin, lactate; obtain chest x-ray. Place patient on BiPAP and consider IV diuresis. Anticipate hospital admission.  ----------------------------------------- 5:53 AM on 05/08/2015 -----------------------------------------  Patient was unable to tolerate BiPAP secondary to anxiety. She was given small dose of Ativan. Chest x-ray and laboratory data consistent with pulmonary edema. IV Lasix ordered. ABG on nonrebreather is pH 7.13, pCO2 86, pO2 61. Patient is more relaxed and we will attempt BiPAP again. I have discussed case with intensivist NP who will evaluate patient in the emergency department for admission. ____________________________________________   FINAL CLINICAL IMPRESSION(S) / ED DIAGNOSES  Final diagnoses:  Acute congestive heart failure, unspecified congestive heart failure type ([place])  Hypoxia  Respiratory distress  Elevated troponin      Paulette Blanch, MD 05/08/15 [phone]

## 2015-05-08 NOTE — H&P (Addendum)
PULMONARY / CRITICAL CARE MEDICINE   Name: Margaret Matthews MRN: [id number] DOB: 12/15/1930    ADMISSION DATE:  05/08/2015   CHIEF COMPLAINT:  Acute dyspnea   HISTORY OF PRESENT ILLNESS:   75 F with PMH of breast cancer and s/p nephrectomy for RCCa. She awoke this early AM with severe dyspnea and was brought to ED by her son where she was found to be hypoxic with pulmonary edema pattern on CXR. She was treated with diuresis and transient BiPAP with marked improvement by the time she reached the ICU. She Denies CP, fever, purulent sputum, hemoptysis, LE edema and calf tenderness   PAST MEDICAL HISTORY :  She  has a past medical history of Hypertension; Renal disorder; Arthritis; CKD (chronic kidney disease) stage 2, GFR 60-89 ml/min; Renal cell carcinoma ([place]); Breast cancer ([place]); Anemia; Arthritis; Osteopenia; GERD (gastroesophageal reflux disease); PUD (peptic ulcer disease); and Glaucoma.  PAST SURGICAL HISTORY: She  has past surgical history that includes Breast surgery; Nephrectomy; Hemiarthroplasty shoulder fracture; Hip surgery; and Colonoscopy.  Allergies  Allergen Reactions  . Statins Other (See Comments)    Reaction: Pt says "it was showing up in her liver."     No current facility-administered medications on file prior to encounter.   Current Outpatient Prescriptions on File Prior to Encounter  Medication Sig  . acidophilus (RISAQUAD) CAPS capsule Take 1 capsule by mouth 2 (two) times daily.  Marland Kitchen aspirin EC 81 MG tablet Take 81 mg by mouth daily.  . beta carotene w/minerals (OCUVITE) tablet Take 1 tablet by mouth every evening.  . Calcium Carbonate-Vit D-Min (CALCIUM 600+D PLUS MINERALS PO) Take 1 tablet by mouth 2 (two) times daily.   . carvedilol (COREG) 3.125 MG tablet Take 6.25 mg by mouth 2 (two) times daily with a meal.  . CRANBERRY PO Take 1 capsule by mouth every evening.  . dorzolamide (TRUSOPT) 2 % ophthalmic solution Place 1 drop into both eyes daily.  . enalapril  (VASOTEC) 5 MG tablet Take 1 tablet (5 mg total) by mouth 2 (two) times daily.  . hydrochlorothiazide (MICROZIDE) 12.5 MG capsule Take 25 mg by mouth daily.  . hydroxychloroquine (PLAQUENIL) 200 MG tablet Take 200 mg by mouth daily.  Marland Kitchen IRON-VITAMIN C PO Take 1 tablet by mouth every evening.  . latanoprost (XALATAN) 0.005 % ophthalmic solution Place 1 drop into both eyes at bedtime.  . Loperamide HCl (IMODIUM PO) Take 1 tablet by mouth daily as needed (for diarrhea).   . mesalamine (PENTASA) 250 MG CR capsule Take 4 capsules (1,000 mg total) by mouth 2 (two) times daily.  . metroNIDAZOLE (FLAGYL) 500 MG tablet Take 1 tablet (500 mg total) by mouth every 8 (eight) hours.  . pantoprazole (PROTONIX) 40 MG tablet Take 40 mg by mouth daily.  . pravastatin (PRAVACHOL) 20 MG tablet Take 20 mg by mouth every evening.    FAMILY HISTORY:  Her has no family status information on file.   SOCIAL HISTORY: She  reports that she has quit smoking. She does not have any smokeless tobacco history on file. She reports that she does not drink alcohol or use illicit drugs.  REVIEW OF SYSTEMS:   Constitutional: Negative for fever and chills.  HENT: Negative for congestion and rhinorrhea.  Eyes: Negative for redness and visual disturbance.  Respiratory: Negative for shortness of breath and wheezing.  Cardiovascular: Negative for chest pain and palpitations.  Gastrointestinal: Negative  for nausea , vomiting and abdominal pain and  Loose stools Genitourinary:  Negative for dysuria and urgency.  Musculoskeletal: Negative for myalgias and arthralgias.  Skin: Negative for pallor and wound.  Neurological: Positive for weakness but negative for dizziness and headaches   SUBJECTIVE:    VITAL SIGNS: BP 169/82 mmHg  Pulse 73  Temp(Src) 97.9 F (36.6 C) (Oral)  Resp 27  Ht 5\' 1"  (1.549 m)  Wt 110 lb (49.896 kg)  BMI 20.80 kg/m2  SpO2 96%  HEMODYNAMICS:    VENTILATOR SETTINGS: Vent Mode:  [-]  FiO2  (%):  [100 %] 100 %  INTAKE / OUTPUT:    PHYSICAL EXAMINATION:  Gen: WDWN in NAD HEENT: All WNL Neck: NO LAN, no JVD noted Lungs: full BS, normal percussion note throughout, bibasilar crackles Cardiovascular: Reg rate, normal rhythm, 123456 harxh systolic heart murmur radiating to neck Abdomen: Soft, NT +BS Ext: no C/C/E Neuro: CNs intact, motor/sens grossly intact Skin: No lesions noted   LABS:  BMET  Recent Labs Lab 05/08/15 0434  NA 136  K 3.3*  CL 105  CO2 25  BUN 18  CREATININE 0.92  GLUCOSE 291*    Electrolytes  Recent Labs Lab 05/08/15 0434  CALCIUM 8.8*    CBC  Recent Labs Lab 05/08/15 0434  WBC 14.1*  HGB 11.9*  HCT 36.2  PLT 259    Coag's No results for input(s): APTT, INR in the last 168 hours.  Sepsis Markers  Recent Labs Lab 05/08/15 0433  LATICACIDVEN 1.5    ABG  Recent Labs Lab 05/08/15 0430  PHART 7.13*  PCO2ART 86*  PO2ART 61*    Liver Enzymes  Recent Labs Lab 05/08/15 0434  AST 29  ALT 20  ALKPHOS 98  BILITOT 0.3  ALBUMIN 3.5    Cardiac Enzymes  Recent Labs Lab 05/08/15 0434  TROPONINI 0.04*   BNP 1278  Glucose No results for input(s): GLUCAP in the last 168 hours.  CXR: Pulmonary edema pattern  EKG: LVH pattern, no definite ischemia or infarction   ASSESSMENT: Acute hypoxemic respiratory failure Pulmonary edema Doubt PNA Minimally elevated troponin I - doubt acute coronary syndrome LVH pattern on EKG Heart murmur - suspect aortic stenosis H/o nephrectomy - risk of AKI Stress induced hyperglycemia without prior hx of DM  PLAN: ICU admission  Supplemental O2 PRN BiPAP Diuresis as permitted by BP and renal function Follow CXR  Cycle cardiac markers Echocardiogram ordered Continue home dose of enalapril and carvedilol Will obtain Cardiology consultation AM 04/03 Monitor BMET intermittently Monitor I/Os Correct electrolytes as indicated Monitor glu on chem panels - if  persistently elevated, consider SSI Carb heart diet ordered No further abx PCT algorithm DVT prophylaxis: enoxaparin SUP: N/I   Merton Border, MD PCCM service Mobile [phone] Pager [phone]  05/08/2015

## 2015-05-08 NOTE — ED Notes (Signed)
Pt presents w/ c/o shortness of breath and has audible rhonchi. Pt is pale, labored, c/o nausea and dizziness but has not vomited.

## 2015-05-09 ENCOUNTER — Inpatient Hospital Stay: Payer: Medicare Other

## 2015-05-09 DIAGNOSIS — I5021 Acute systolic (congestive) heart failure: Secondary | ICD-10-CM

## 2015-05-09 LAB — BASIC METABOLIC PANEL
ANION GAP: 5 (ref 5–15)
BUN: 20 mg/dL (ref 6–20)
CO2: 28 mmol/L (ref 22–32)
Calcium: 8.7 mg/dL — ABNORMAL LOW (ref 8.9–10.3)
Chloride: 102 mmol/L (ref 101–111)
Creatinine, Ser: 1.08 mg/dL — ABNORMAL HIGH (ref 0.44–1.00)
GFR calc Af Amer: 53 mL/min — ABNORMAL LOW (ref >60)
GFR, EST NON AFRICAN AMERICAN: 46 mL/min — AB (ref >60)
Glucose, Bld: 100 mg/dL — ABNORMAL HIGH (ref 65–99)
Potassium: 3.8 mmol/L (ref 3.5–5.1)
Sodium: 135 mmol/L (ref 135–145)

## 2015-05-09 LAB — ECHOCARDIOGRAM COMPLETE
Height: 61 in
WEIGHTICAEL: 1883.61 [oz_av]

## 2015-05-09 LAB — CBC
HCT: 33.6 % — ABNORMAL LOW (ref 35.0–47.0)
Hemoglobin: 11.2 g/dL — ABNORMAL LOW (ref 12.0–16.0)
MCH: 28.6 pg (ref 26.0–34.0)
MCHC: 33.3 g/dL (ref 32.0–36.0)
MCV: 85.9 fL (ref 80.0–100.0)
PLATELETS: 233 10*3/uL (ref 150–440)
RBC: 3.91 MIL/uL (ref 3.80–5.20)
RDW: 14 % (ref 11.5–14.5)
WBC: 13.9 10*3/uL — AB (ref 3.6–11.0)

## 2015-05-09 LAB — PROCALCITONIN: Procalcitonin: 2.74 ng/mL

## 2015-05-09 LAB — PHOSPHORUS: Phosphorus: 3.2 mg/dL (ref 2.5–4.6)

## 2015-05-09 LAB — GLUCOSE, CAPILLARY
GLUCOSE-CAPILLARY: 110 mg/dL — AB (ref 65–99)
Glucose-Capillary: 156 mg/dL — ABNORMAL HIGH (ref 65–99)
Glucose-Capillary: 89 mg/dL (ref 65–99)

## 2015-05-09 LAB — MAGNESIUM: MAGNESIUM: 2.3 mg/dL (ref 1.7–2.4)

## 2015-05-09 LAB — BRAIN NATRIURETIC PEPTIDE: B NATRIURETIC PEPTIDE 5: 1976 pg/mL — AB (ref 0.0–100.0)

## 2015-05-09 MED ORDER — ENOXAPARIN SODIUM 30 MG/0.3ML ~~LOC~~ SOLN
30.0000 mg | SUBCUTANEOUS | Status: DC
  Administered 2015-05-09 – 2015-05-11 (×3): 30 mg via SUBCUTANEOUS
  Filled 2015-05-09 (×3): qty 0.3

## 2015-05-09 MED ORDER — MESALAMINE ER 250 MG PO CPCR
1000.0000 mg | ORAL_CAPSULE | Freq: Two times a day (BID) | ORAL | Status: DC
  Administered 2015-05-09 – 2015-05-12 (×7): 1000 mg via ORAL
  Filled 2015-05-09 (×13): qty 4

## 2015-05-09 MED ORDER — PANTOPRAZOLE SODIUM 40 MG PO TBEC
40.0000 mg | DELAYED_RELEASE_TABLET | Freq: Every day | ORAL | Status: DC
  Administered 2015-05-09 – 2015-05-12 (×4): 40 mg via ORAL
  Filled 2015-05-09 (×4): qty 1

## 2015-05-09 MED ORDER — FUROSEMIDE 10 MG/ML IJ SOLN
20.0000 mg | Freq: Two times a day (BID) | INTRAMUSCULAR | Status: DC
  Administered 2015-05-09 – 2015-05-11 (×4): 20 mg via INTRAVENOUS
  Filled 2015-05-09 (×4): qty 2

## 2015-05-09 MED ORDER — HYDROCHLOROTHIAZIDE 25 MG PO TABS
25.0000 mg | ORAL_TABLET | Freq: Every day | ORAL | Status: DC
  Administered 2015-05-09 – 2015-05-10 (×2): 25 mg via ORAL
  Filled 2015-05-09 (×2): qty 1

## 2015-05-09 MED ORDER — SODIUM CHLORIDE 0.9 % IV BOLUS (SEPSIS)
250.0000 mL | Freq: Once | INTRAVENOUS | Status: DC
Start: 2015-05-09 — End: 2015-05-12

## 2015-05-09 MED ORDER — NITROGLYCERIN 2 % TD OINT
1.0000 [in_us] | TOPICAL_OINTMENT | Freq: Four times a day (QID) | TRANSDERMAL | Status: DC
  Administered 2015-05-10 (×2): 1 [in_us] via TOPICAL
  Filled 2015-05-09 (×2): qty 1

## 2015-05-09 MED ORDER — PRAVASTATIN SODIUM 20 MG PO TABS
20.0000 mg | ORAL_TABLET | Freq: Every day | ORAL | Status: DC
  Administered 2015-05-09 – 2015-05-11 (×3): 20 mg via ORAL
  Filled 2015-05-09 (×3): qty 1

## 2015-05-09 MED ORDER — HYDROXYCHLOROQUINE SULFATE 200 MG PO TABS
200.0000 mg | ORAL_TABLET | Freq: Every day | ORAL | Status: DC
  Administered 2015-05-09 – 2015-05-12 (×4): 200 mg via ORAL
  Filled 2015-05-09 (×4): qty 1

## 2015-05-09 NOTE — Progress Notes (Signed)
Patient is signed out to me this afternoon.  By ICU attending. Admitted to ICU because of acute respiratory failure secondary to acute exacerbation. Patient initially required BiPAP, as IV Lasix patient breathing improved, she is now on room air saturating 92%. So hold her to 1 cc. Echocardiogram showed EF more than 65%. Patient will be seen by our hospitalist tomorrow. Examined the patient this afternoon, lungs are clear. Meds reviewed and continue aspirin, Coreg, enalapril,.

## 2015-05-09 NOTE — Progress Notes (Signed)
Pt has remained A & O x 3 with disorientation to time. NSR on cardiac monitor with slight hypotension- some BP medications withheld. No c/o pain. Lung sounds clear to auscultation on 3LNC at this time. Foley to be discontinued. Report to be given to floor nurse Estill Bamberg.

## 2015-05-09 NOTE — Care Management (Signed)
Independent in all adls, denies issues accessing medical care, obtaining medications or with transportation.  Current with her PCP - Dr Netty Starring.  Admitted to icu stepdown due to the need for bipap for pulmonary edema.  Lives with her son

## 2015-05-09 NOTE — H&P (Signed)
PULMONARY / CRITICAL CARE MEDICINE   Name: Margaret Matthews MRN: [id number] DOB: 1930/07/05    ADMISSION DATE:  05/08/2015   CHIEF COMPLAINT: follow  Acute dyspnea   HISTORY OF PRESENT ILLNESS:   Alert and awake, following commands, off biPAP, breathing comfortably, no acute complaints   REVIEW OF SYSTEMS:   Constitutional: Negative for fever and chills.  HENT: Negative for congestion and rhinorrhea.  Eyes: Negative for redness and visual disturbance.  Respiratory: Negative for shortness of breath and wheezing.  Cardiovascular: Negative for chest pain and palpitations.  Gastrointestinal: Negative  for nausea , vomiting and abdominal pain and  Loose stools Genitourinary: Negative for dysuria and urgency.  Musculoskeletal: Negative for myalgias and arthralgias.  Skin: Negative for pallor and wound.  Neurological: Positive for weakness but negative for dizziness and headaches    VITAL SIGNS: BP 136/69 mmHg  Pulse 75  Temp(Src) 98.8 F (37.1 C) (Oral)  Resp 28  Ht 5\' 1"  (1.549 m)  Wt 115 lb 11.9 oz (52.5 kg)  BMI 21.88 kg/m2  SpO2 98%      INTAKE / OUTPUT: I/O last 3 completed shifts: In: 46 [IV Piggyback:50] Out: 2025 [Urine:2025]  PHYSICAL EXAMINATION:  Gen: thin, NAD HEENT: All WNL Neck: NO LAN, no JVD noted Lungs: full BS, normal percussion note throughout, bibasilar crackles Cardiovascular: Reg rate, normal rhythm, 123456 harxh systolic heart murmur radiating to neck Abdomen: Soft, NT +BS Ext: no C/C/E Neuro: CNs intact, motor/sens grossly intact Skin: No lesions noted   LABS:  BMET  Recent Labs Lab 05/08/15 0434 05/08/15 2137 05/09/15 0319  NA 136 134* 135  K 3.3* 3.8 3.8  CL 105 99* 102  CO2 25 28 28   BUN 18 18 20   CREATININE 0.92 0.85 1.08*  GLUCOSE 291* 225* 100*    Electrolytes  Recent Labs Lab 05/08/15 0434 05/08/15 2137 05/09/15 0319  CALCIUM 8.8* 8.9 8.7*  MG  --  1.6* 2.3  PHOS  --  4.3 3.2    CBC  Recent Labs Lab  05/08/15 0434 05/09/15 0319  WBC 14.1* 13.9*  HGB 11.9* 11.2*  HCT 36.2 33.6*  PLT 259 233    Coag's No results for input(s): APTT, INR in the last 168 hours.  Sepsis Markers  Recent Labs Lab 05/08/15 0433 05/08/15 0750 05/09/15 0319  LATICACIDVEN 1.5 0.8  --   PROCALCITON  --  0.30 2.74    ABG  Recent Labs Lab 05/08/15 0430 05/08/15 2115  PHART 7.13* 7.21*  PCO2ART 86* 73*  PO2ART 61* 61*    Liver Enzymes  Recent Labs Lab 05/08/15 0434  AST 29  ALT 20  ALKPHOS 98  BILITOT 0.3  ALBUMIN 3.5    Cardiac Enzymes  Recent Labs Lab 05/08/15 0750 05/08/15 1253 05/08/15 1837  TROPONINI 0.16* 0.32* 0.34*   BNP 1278  Glucose  Recent Labs Lab 05/08/15 0840 05/08/15 2343 05/09/15 0609  GLUCAP 246* 215* 110*    CXR: Pulmonary edema pattern  EKG: LVH pattern, no definite ischemia or infarction   ASSESSMENT: Acute hypoxemic respiratory failure Pulmonary edema Doubt PNA Minimally elevated troponin I - doubt acute coronary syndrome LVH pattern on EKG Heart murmur - suspect aortic stenosis H/o nephrectomy - risk of AKI Stress induced hyperglycemia without prior hx of DM  PLAN: Recommend transfer to gen med floor Supplemental O2 as needed Diuresis as permitted by BP and renal function Echocardiogram ordered results pending Continue home dose of enalapril and carvedilol Monitor BMET intermittently Carb heart diet  ordered No further abx DVT prophylaxis: enoxaparin  Hospitalist team contacted to transfer out of ICU  I have personally obtained a history, examined the patient, evaluated Pertinent laboratory and RadioGraphic/imaging results, and  formulated the assessment and plan   The Patient requires high complexity decision making for assessment and support, frequent evaluation and titration of therapies.  Patient  satisfied with Plan of action and management. All questions answered  Will sign off at this time   Corrin Parker,  M.D.  [facility]  Medical Director [place] Director [facility] Cardio-Pulmonary Department

## 2015-05-09 NOTE — Progress Notes (Signed)
Report given to De Hollingshead RN to take over patient care.

## 2015-05-09 NOTE — Progress Notes (Signed)
Pt transfer from CCU, pt with no noted complaints, voices any needs

## 2015-05-09 NOTE — Progress Notes (Signed)
Dr. Titus Mould from [place] was notified that I was holding the fluid bolus ordered d/t blood pressure ok now and pt with pulmonary edema noted on cxr.

## 2015-05-09 NOTE — Progress Notes (Signed)
Inpatient Diabetes Program Recommendations  AACE/ADA: New Consensus Statement on Inpatient Glycemic Control (2015)  Target Ranges:  Prepandial:   less than 140 mg/dL      Peak postprandial:   less than 180 mg/dL (1-2 hours)      Critically ill patients:  140 - 180 mg/dL   Review of Glycemic Control  Results for Margaret Matthews, Margaret Matthews (MRN [id number]) as of 05/09/2015 13:29  Ref. Range 05/08/2015 08:40 05/08/2015 23:43 05/09/2015 06:09 05/09/2015 11:59  Glucose-Capillary Latest Ref Range: 65-99 mg/dL 246 (H) 215 (H) 110 (H) 156 (H)    Diabetes history: none noted- A1C=  07/07/14   5.3% Outpatient Diabetes medications: none Current orders for Inpatient glycemic control: Novolog 0-9 units q6h  Inpatient Diabetes Program Recommendations: Consider checking an A1C to determine blood sugar control over the past 2-3 months.   Consider decreasing Novolog correction 0-5 units tid with meals (3 units dropped her 115 points).   Consider  150-200mg /dl=1 unit, 201-250mg /dl=2 units, 251-300mg /dl=3 units 351-400mg /dl=4 units and >401=5units  Gentry Fitz, RN, [place], [place], CDE Diabetes Coordinator Inpatient Diabetes Program  [phone] (Team Pager) [phone] ([place]) 05/09/2015 1:57 PM

## 2015-05-09 NOTE — Progress Notes (Signed)
eLink Physician-Brief Progress Note Patient Name: Margaret Matthews DOB: [birth year] MRN: [id number]   Date of Service  05/09/2015  HPI/Events of Note  Echo awaited, concerns from ICU MD of AS , valvular dz, pcxr with edema over 24 hrs ago, f low MAP now  eICU Interventions  Give a small bolus and assess reposnse If AS then would be very preload dependent     Intervention Category Major Interventions: Hypotension - evaluation and management  FEINSTEIN,DANIEL J. 05/09/2015, 12:46 AM

## 2015-05-09 NOTE — Progress Notes (Signed)
Chaplain rounded the unit and provided a compassionate presence and spiritual support to the patient.  Chaplain Sayde Lish [phone] 

## 2015-05-09 NOTE — Progress Notes (Signed)
Per Dr. Mortimer Fries pt may transfer to any medsurg without telemetry order placed

## 2015-05-09 NOTE — Progress Notes (Signed)
Dr. Ether Griffins notified pt oxygen saturation ranging from 88-90% now on 6L, SOB at rest. See new orders. Will continue to closely monitor.

## 2015-05-10 LAB — C DIFFICILE QUICK SCREEN W PCR REFLEX
C DIFFICLE (CDIFF) ANTIGEN: NEGATIVE
C Diff interpretation: NEGATIVE
C Diff toxin: NEGATIVE

## 2015-05-10 LAB — PROCALCITONIN: Procalcitonin: 2.43 ng/mL

## 2015-05-10 LAB — HEMOGLOBIN A1C: HEMOGLOBIN A1C: 5.2 % (ref 4.0–6.0)

## 2015-05-10 LAB — GLUCOSE, CAPILLARY
GLUCOSE-CAPILLARY: 109 mg/dL — AB (ref 65–99)
GLUCOSE-CAPILLARY: 92 mg/dL (ref 65–99)
Glucose-Capillary: 88 mg/dL (ref 65–99)
Glucose-Capillary: 214 mg/dL — ABNORMAL HIGH (ref 65–99)
Glucose-Capillary: 101 mg/dL — ABNORMAL HIGH (ref 65–99)
Glucose-Capillary: 121 mg/dL — ABNORMAL HIGH (ref 65–99)

## 2015-05-10 LAB — URINE CULTURE: Culture: >=100000

## 2015-05-10 MED ORDER — DORZOLAMIDE HCL 2 % OP SOLN
1.0000 [drp] | Freq: Every day | OPHTHALMIC | Status: DC
Start: 2015-05-10 — End: 2015-05-12
  Administered 2015-05-10 – 2015-05-12 (×3): 1 [drp] via OPHTHALMIC
  Filled 2015-05-10: qty 10

## 2015-05-10 MED ORDER — INSULIN ASPART 100 UNIT/ML ~~LOC~~ SOLN
0.0000 [IU] | Freq: Three times a day (TID) | SUBCUTANEOUS | Status: DC
  Administered 2015-05-11: 12:00:00 1 [IU] via SUBCUTANEOUS
  Filled 2015-05-10: qty 1

## 2015-05-10 MED ORDER — LOPERAMIDE HCL 2 MG PO CAPS
4.0000 mg | ORAL_CAPSULE | ORAL | Status: DC | PRN
  Administered 2015-05-10: 14:00:00 4 mg via ORAL
  Filled 2015-05-10: qty 2

## 2015-05-10 MED ORDER — CEPHALEXIN 250 MG PO CAPS
250.0000 mg | ORAL_CAPSULE | Freq: Three times a day (TID) | ORAL | Status: DC
  Administered 2015-05-10 – 2015-05-12 (×6): 250 mg via ORAL
  Filled 2015-05-10 (×10): qty 1

## 2015-05-10 MED ORDER — LATANOPROST 0.005 % OP SOLN
1.0000 [drp] | Freq: Every day | OPHTHALMIC | Status: DC
  Administered 2015-05-10 – 2015-05-11 (×2): 1 [drp] via OPHTHALMIC
  Filled 2015-05-10: qty 2.5

## 2015-05-10 NOTE — Consult Note (Signed)
[place]  CARDIOLOGY CONSULT NOTE  [name] MRN: [id number] DOB/AGE: 1930-06-03 82 y.o.  Admit date: 05/08/2015 Referring Physician Dr. Leslye Peer Primary Physician Dr. Netty Starring Primary Cardiologist   Reason for Consultation aortic stenosis/chf  HPI: Pt is a 82 yo female with pmh of breast ca, s/p nephrectomy for renal cell carcinoma who has history of aortic valve disease with echo done in 7/16 revealing severe as with ava of 0.8 and peak gradient of 64.6 and mean gradient of 42 who develped acute onsent of severe dyspnea and was noted to be hypoxic with pulmonary edema in the er. She was treated with diuresis and bipap with improvement.  She is currently resting comfortably.  Echo done during this hospitalization showed preserved lv funciton with ef of 60-65% with severe aortic stenosis with ava of 0.5 cm2 with a peak gradent of 76 mmHg and mead gradient of 50 mmHg. Current cxr shows improvement.   Review of Systems  HENT: Negative.   Eyes: Negative.   Respiratory: Positive for shortness of breath.   Cardiovascular: Negative.   Gastrointestinal: Negative.   Genitourinary: Negative.   Musculoskeletal: Negative.   Skin: Negative.   Neurological: Positive for weakness.  Endo/Heme/Allergies: Negative.   Psychiatric/Behavioral: Negative.     Past Medical History  Diagnosis Date  . Hypertension   . Renal disorder   . Arthritis   . CKD (chronic kidney disease) stage 2, GFR 60-89 ml/min     baseline creatinine 1.5  . Renal cell carcinoma ([place])   . Breast cancer ([place])   . Anemia   . Arthritis   . Osteopenia   . GERD (gastroesophageal reflux disease)   . PUD (peptic ulcer disease)   . Glaucoma     Family History  Problem Relation Age of Onset  . CAD Mother   . CVA Father   . CAD Sister     Social History   Social History  . Marital Status: Widowed    Spouse Name: N/A  . Number of Children: N/A  . Years of  Education: N/A   Occupational History  . Not on file.   Social History Main Topics  . Smoking status: Former Research scientist (life sciences)  . Smokeless tobacco: Not on file  . Alcohol Use: No  . Drug Use: No  . Sexual Activity: Not on file   Other Topics Concern  . Not on file   Social History Narrative    Past Surgical History  Procedure Laterality Date  . Breast surgery      Left mastectomy  . Nephrectomy      Right nephrectomy  . Hemiarthroplasty shoulder fracture      Right shoulder  . Hip surgery      Left hip  . Colonoscopy       Prescriptions prior to admission  Medication Sig Dispense Refill Last Dose  . acidophilus (RISAQUAD) CAPS capsule Take 1 capsule by mouth 2 (two) times daily. 15 capsule 0   . aspirin EC 81 MG tablet Take 81 mg by mouth daily.   07/20/2014  . beta carotene w/minerals (OCUVITE) tablet Take 1 tablet by mouth every evening.   07/19/2014  . Calcium Carbonate-Vit D-Min (CALCIUM 600+D PLUS MINERALS PO) Take 1 tablet by mouth 2 (two) times daily.    07/20/2014  . CRANBERRY PO Take 1 capsule by mouth every evening.   07/19/2014  . enalapril (VASOTEC) 5 MG tablet Take 1 tablet (5 mg total) by mouth  2 (two) times daily. 60 tablet 0   . hydrochlorothiazide (MICROZIDE) 12.5 MG capsule Take 25 mg by mouth daily.   07/20/2014  . hydroxychloroquine (PLAQUENIL) 200 MG tablet Take 200 mg by mouth daily.   07/20/2014  . IRON-VITAMIN C PO Take 1 tablet by mouth every evening.   07/19/2014  . Loperamide HCl (IMODIUM PO) Take 1 tablet by mouth daily as needed (for diarrhea).    07/19/2014  . mesalamine (PENTASA) 250 MG CR capsule Take 4 capsules (1,000 mg total) by mouth 2 (two) times daily. 30 capsule 2   . metroNIDAZOLE (FLAGYL) 500 MG tablet Take 1 tablet (500 mg total) by mouth every 8 (eight) hours. 20 tablet 0   . pravastatin (PRAVACHOL) 20 MG tablet Take 20 mg by mouth every evening.   07/19/2014    Physical Exam: Blood pressure 134/55, pulse 72, temperature 98.6 F (37 C),  temperature source Oral, resp. rate 18, height 5\' 1"  (1.549 m), weight 52.663 kg (116 lb 1.6 oz), SpO2 99 %.   Wt Readings from Last 1 Encounters:  05/10/15 52.663 kg (116 lb 1.6 oz)     General appearance: cooperative Head: Normocephalic, without obvious abnormality, atraumatic Ears: hoh Resp: rhonchi bibasilar Chest wall: no tenderness Cardio: regular rate and rhythm and systolic murmur: late systolic 3/6, crescendo and decrescendo at lower left sternal border, radiates to carotids GI: soft, non-tender; bowel sounds normal; no masses,  no organomegaly Extremities: extremities normal, atraumatic, no cyanosis or edema Neurologic: Grossly normal  Labs:   Lab Results  Component Value Date   WBC 13.9* 05/09/2015   HGB 11.2* 05/09/2015   HCT 33.6* 05/09/2015   MCV 85.9 05/09/2015   PLT 233 05/09/2015    Recent Labs Lab 05/08/15 0434  05/09/15 0319  NA 136  < > 135  K 3.3*  < > 3.8  CL 105  < > 102  CO2 25  < > 28  BUN 18  < > 20  CREATININE 0.92  < > 1.08*  CALCIUM 8.8*  < > 8.7*  PROT 6.9  --   --   BILITOT 0.3  --   --   ALKPHOS 98  --   --   ALT 20  --   --   AST 29  --   --   GLUCOSE 291*  < > 100*  < > = values in this interval not displayed. Lab Results  Component Value Date   [id number]* 09/07/2012   CKMB 4.4* 09/07/2012   TROPONINI 0.34* 05/08/2015      Radiology: mild pulmonary edema EKG: nsr with lvh  ASSESSMENT AND PLAN:  82 yo female who is somewhat frail but still drives her car and does light housework with history of severe aortic stenosis by echo in 2016, who was admitted with progressive sob and noted to be in chf. Echo done during this admission shows evidence of severe aortic stenosis. Will need consideration for further workup of her aortic valve as outpatient after resolution of her chf. Her elevated troponin is likely secondary to her fixed cardiac output and chf. Will discontinue enalapril due to high output gradient. Will continue with  pravastatin and continued careful diuresis. Will further discuss consideration for right and left heart cath after resolution of her chf as out patient.  Signed: Teodoro Spray MD, [facility] 05/10/2015, 1:52 PM

## 2015-05-10 NOTE — Progress Notes (Signed)
[name], female   DOB: Jan 19, 1931, 82 y.o.   MRN: [id number] [facility] PROGRESS NOTE  Margaret Matthews [id number] DOB: [birth year] DOA: 05/08/2015 PCP: Dion Body, MD  HPI/Subjective: Patient still short of breath. Still with cough. Feels weak.  Objective: Filed Vitals:   05/10/15 0957 05/10/15 1411  BP: 134/55 132/59  Pulse:  73  Temp:  97.3 F (36.3 C)  Resp:  18    Filed Weights   05/08/15 0839 05/09/15 0515 05/10/15 0612  Weight: 53.4 kg (117 lb 11.6 oz) 52.5 kg (115 lb 11.9 oz) 52.663 kg (116 lb 1.6 oz)    ROS: Review of Systems  Constitutional: Negative for fever and chills.  Eyes: Negative for blurred vision.  Respiratory: Positive for cough and shortness of breath.   Cardiovascular: Negative for chest pain.  Gastrointestinal: Positive for diarrhea. Negative for nausea, vomiting, abdominal pain and constipation.  Genitourinary: Negative for dysuria.  Musculoskeletal: Negative for joint pain.  Neurological: Negative for dizziness and headaches.   Exam: Physical Exam  Constitutional: She is oriented to person, place, and time.  HENT:  Nose: No mucosal edema.  Mouth/Throat: No oropharyngeal exudate or posterior oropharyngeal edema.  Eyes: Conjunctivae, EOM and lids are normal. Pupils are equal, round, and reactive to light.  Neck: No JVD present. Carotid bruit is not present. No edema present. No thyroid mass and no thyromegaly present.  Cardiovascular: S1 normal and S2 normal.  Exam reveals no gallop.   Murmur heard.  Systolic murmur is present with a grade of 4/6  Pulses:      Dorsalis pedis pulses are 2+ on the right side, and 2+ on the left side.  Respiratory: No respiratory distress. She has no wheezes. She has no rhonchi. She has rales in the right middle field, the right lower field, the left middle field and the left lower field.  GI: Soft. Bowel sounds are normal. There is no tenderness.  Musculoskeletal:       Right  ankle: She exhibits no swelling.       Left ankle: She exhibits no swelling.  Lymphadenopathy:    She has no cervical adenopathy.  Neurological: She is alert and oriented to person, place, and time. No cranial nerve deficit.  Skin: Skin is warm. No rash noted. Nails show no clubbing.  Psychiatric: She has a normal mood and affect.      Data Reviewed: Basic Metabolic Panel:  Recent Labs Lab 05/08/15 0434 05/08/15 2137 05/09/15 0319  NA 136 134* 135  K 3.3* 3.8 3.8  CL 105 99* 102  CO2 25 28 28   GLUCOSE 291* 225* 100*  BUN 18 18 20   CREATININE 0.92 0.85 1.08*  CALCIUM 8.8* 8.9 8.7*  MG  --  1.6* 2.3  PHOS  --  4.3 3.2   Liver Function Tests:  Recent Labs Lab 05/08/15 0434  AST 29  ALT 20  ALKPHOS 98  BILITOT 0.3  PROT 6.9  ALBUMIN 3.5   CBC:  Recent Labs Lab 05/08/15 0434 05/09/15 0319  WBC 14.1* 13.9*  NEUTROABS 11.5*  --   HGB 11.9* 11.2*  HCT 36.2 33.6*  MCV 88.5 85.9  PLT 259 233   Cardiac Enzymes:  Recent Labs Lab 05/08/15 0434 05/08/15 0750 05/08/15 1253 05/08/15 1837  TROPONINI 0.04* 0.16* 0.32* 0.34*   BNP (last 3 results)  Recent Labs  07/24/14 1259 05/08/15 0433 05/09/15 0319  BNP 594.0* 1278.0* 1976.0*    CBG:  Recent Labs Lab 05/09/15  1636 05/10/15 0020 05/10/15 0612 05/10/15 0737 05/10/15 1118  GLUCAP 89 109* 92 88 214*    Recent Results (from the past 240 hour(s))  Culture, blood (routine x 2)     Status: None (Preliminary result)   Collection Time: 05/08/15  4:48 AM  Result Value Ref Range Status   Specimen Description BLOOD BLOOD RIGHT ARM  Final   Special Requests BOTTLES DRAWN AEROBIC AND ANAEROBIC 6CC  Final   Culture NO GROWTH 2 DAYS  Final   Report Status PENDING  Incomplete  Culture, blood (routine x 2)     Status: None (Preliminary result)   Collection Time: 05/08/15  5:23 AM  Result Value Ref Range Status   Specimen Description BLOOD RIGHT ASSIST CONTROL  Final   Special Requests   Final     BOTTLES DRAWN AEROBIC AND ANAEROBIC 12CCAERO,6CCANA   Culture NO GROWTH 2 DAYS  Final   Report Status PENDING  Incomplete  Urine culture     Status: None   Collection Time: 05/08/15  6:49 AM  Result Value Ref Range Status   Specimen Description URINE, RANDOM  Final   Special Requests NONE  Final   Culture >=100,000 COLONIES/mL KLEBSIELLA PNEUMONIAE  Final   Report Status 05/10/2015 FINAL  Final   Organism ID, Bacteria KLEBSIELLA PNEUMONIAE  Final      Susceptibility   Klebsiella pneumoniae - MIC*    AMPICILLIN 16 RESISTANT Resistant     CEFAZOLIN <=4 SENSITIVE Sensitive     CEFTRIAXONE <=1 SENSITIVE Sensitive     CIPROFLOXACIN <=0.25 SENSITIVE Sensitive     GENTAMICIN <=1 SENSITIVE Sensitive     IMIPENEM <=0.25 SENSITIVE Sensitive     NITROFURANTOIN 32 SENSITIVE Sensitive     TRIMETH/SULFA <=20 SENSITIVE Sensitive     AMPICILLIN/SULBACTAM 4 SENSITIVE Sensitive     PIP/TAZO <=4 SENSITIVE Sensitive     Extended ESBL NEGATIVE Sensitive     * >=100,000 COLONIES/mL KLEBSIELLA PNEUMONIAE  MRSA PCR Screening     Status: None   Collection Time: 05/08/15  8:39 AM  Result Value Ref Range Status   MRSA by PCR NEGATIVE NEGATIVE Final    Comment:        The GeneXpert MRSA Assay (FDA approved for NASAL specimens only), is one component of a comprehensive MRSA colonization surveillance program. It is not intended to diagnose MRSA infection nor to guide or monitor treatment for MRSA infections.   Urine culture     Status: None (Preliminary result)   Collection Time: 05/08/15  9:15 PM  Result Value Ref Range Status   Specimen Description URINE, CATHETERIZED  Final   Special Requests NONE  Final   Culture HOLDING FOR POSSIBLE PATHOGEN  Final   Report Status PENDING  Incomplete  C difficile quick scan w PCR reflex     Status: None   Collection Time: 05/10/15 12:17 PM  Result Value Ref Range Status   C Diff antigen NEGATIVE NEGATIVE Final   C Diff toxin NEGATIVE NEGATIVE Final   C  Diff interpretation Negative for C. difficile  Final     Studies: Dg Chest Port 1 View  05/09/2015  CLINICAL DATA:  Acute respiratory failure EXAM: PORTABLE CHEST 1 VIEW COMPARISON:  05/08/2015 FINDINGS: Stable mild cardiac silhouette enlargement. Hazy perihilar opacity bilaterally right worse than left. Diffuse interstitial prominence less conspicuous when compared to the prior study. Mild vascular congestion decreased in severity when compared to prior study. IMPRESSION: Findings again consistent with pulmonary edema showing interval improvement.  Electronically Signed   By: Skipper Cliche M.D.   On: 05/09/2015 07:18    Scheduled Meds: . aspirin EC  81 mg Oral Daily  . carvedilol  3.125 mg Oral BID WC  . enoxaparin (LOVENOX) injection  30 mg Subcutaneous Q24H  . feeding supplement (ENSURE ENLIVE)  237 mL Oral TID BM  . furosemide  20 mg Intravenous Q12H  . hydroxychloroquine  200 mg Oral Daily  . insulin aspart  0-9 Units Subcutaneous 4 times per day  . mesalamine  1,000 mg Oral BID  . pantoprazole  40 mg Oral Daily  . pravastatin  20 mg Oral q1800  . sodium chloride  250 mL Intravenous Once    Assessment/Plan:  1. Acute diastolic congestive heart failure with severe aortic stenosis. Overall prognosis is poor with these 2 factors. Case discussed with Dr. Ubaldo Glassing cardiology. Continue IV Lasix 20 mg IV twice a day. I discontinued hydrochlorothiazide, Nitropaste and enalapril. Continue Coreg 2. Acute respiratory failure with hypoxia. Continue nasal cannula supplementation 3. Acute cystitis with hematuria. Klebsiella growing in urine culture. Start by mouth Keflex. 4. Type 2 diabetes mellitus. As per diabetes coordinator put on specialized sliding scale 5. Hyperlipidemia unspecified continue pravastatin 6. Gastroesophageal reflux disease without esophagitis continue Protonix 7. Weakness physical therapy evaluation  Code Status:     Code Status Orders        Start     Ordered    05/08/15 0652  Full code   Continuous     05/08/15 0700    Code Status History    Date Active Date Inactive Code Status Order ID Comments User Context   07/20/2014 10:30 PM 07/23/2014  5:03 PM Full Code [id number]  Gladstone Lighter, MD ED     Family Communication: Son on phone Disposition Plan: TBA  Consultants:  Cardiology  Antibiotics:  keflex  Time spent: 25 minutes  Loletha Grayer  [employer]

## 2015-05-10 NOTE — Care Management Important Message (Signed)
Important Message  Patient Details  Name: CIELO SCHNEITER MRN: [id number] Date of Birth: 1930/03/14   Medicare Important Message Given:  Yes    Juliann Pulse A Edwar Coe 05/10/2015, 10:26 AM

## 2015-05-10 NOTE — Progress Notes (Signed)
Initial Heart Failure Clinic appointment scheduled for May 27, 2015 at 11:00am. Thank you.

## 2015-05-10 NOTE — Discharge Instructions (Signed)
Heart Failure Clinic appointment on May 27, 2015 at 11:00am with Darylene Price, [place]. Please call [phone] to reschedule.

## 2015-05-11 LAB — URINE CULTURE

## 2015-05-11 LAB — GLUCOSE, CAPILLARY
GLUCOSE-CAPILLARY: 89 mg/dL (ref 65–99)
GLUCOSE-CAPILLARY: 182 mg/dL — AB (ref 65–99)
GLUCOSE-CAPILLARY: 124 mg/dL — AB (ref 65–99)
Glucose-Capillary: 148 mg/dL — ABNORMAL HIGH (ref 65–99)

## 2015-05-11 LAB — CREATININE, SERUM
CREATININE: 1.2 mg/dL — AB (ref 0.44–1.00)
GFR, EST AFRICAN AMERICAN: 47 mL/min — AB (ref >60)
GFR, EST NON AFRICAN AMERICAN: 41 mL/min — AB (ref >60)

## 2015-05-11 NOTE — NC FL2 (Signed)
[place] LEVEL OF CARE SCREENING TOOL     IDENTIFICATION  Patient Name: Margaret Matthews Birthdate: 11/09/1930 Sex: female Admission Date (Current Location): 05/08/2015  [place] and [place] Number:  Engineering geologist and Address:  [facility], [address] [place], [place] [address]      Provider Number: [id number]  Attending Physician Name and Address:  Fritzi Mandes, MD  Relative Name and Phone Number:       Current Level of Care: Hospital Recommended Level of Care:   Prior Approval Number:    Date Approved/Denied:   PASRR Number:    Discharge Plan: SNF    Current Diagnoses: Patient Active Problem List   Diagnosis Date Noted  . CHF (congestive heart failure) ([place]) 05/08/2015  . ARF (acute renal failure) ([place]) 07/20/2014    Orientation RESPIRATION BLADDER Height & Weight     Self, Time, Situation, Place  Normal Continent Weight: 116 lb 1.6 oz (52.663 kg) Height:  5\' 1"  (154.9 cm)  BEHAVIORAL SYMPTOMS/MOOD NEUROLOGICAL BOWEL NUTRITION STATUS      Continent Diet (Health Healthy/Carb Modified)  AMBULATORY STATUS COMMUNICATION OF NEEDS Skin   Limited Assist   Normal                       Personal Care Assistance Level of Assistance  Bathing, Feeding, Dressing Bathing Assistance: Limited assistance Feeding assistance: Limited assistance Dressing Assistance: Limited assistance     Functional Limitations Info  Sight, Hearing, Speech Sight Info: Adequate Hearing Info: Adequate Speech Info: Adequate    SPECIAL CARE FACTORS FREQUENCY  PT (By licensed PT)     PT Frequency: PT              Contractures      Additional Factors Info  Code Status, Allergies Code Status Info: Full Code  Allergies Info: Statins           Current Medications (05/11/2015):  This is the current hospital active medication list Current Facility-Administered Medications  Medication Dose Route Frequency Provider Last Rate Last  Dose  . 0.9 %  sodium chloride infusion  250 mL Intravenous PRN Mikael Spray, NP      . albuterol (PROVENTIL) (2.5 MG/3ML) 0.083% nebulizer solution 2.5 mg  2.5 mg Nebulization Q4H PRN Mikael Spray, NP   2.5 mg at 05/08/15 2049  . aspirin EC tablet 81 mg  81 mg Oral Daily Wilhelmina Mcardle, MD   81 mg at 05/11/15 [id number]  . carvedilol (COREG) tablet 3.125 mg  3.125 mg Oral BID WC Mikael Spray, NP   3.125 mg at 05/11/15 0906  . cephALEXin (KEFLEX) capsule 250 mg  250 mg Oral 3 times per day Loletha Grayer, MD   250 mg at 05/11/15 0511  . dorzolamide (TRUSOPT) 2 % ophthalmic solution 1 drop  1 drop Both Eyes Daily Loletha Grayer, MD   1 drop at 05/11/15 [phone]  . enoxaparin (LOVENOX) injection 30 mg  30 mg Subcutaneous Q24H Vena Rua, RPH   30 mg at 05/11/15 1200  . feeding supplement (ENSURE ENLIVE) (ENSURE ENLIVE) liquid 237 mL  237 mL Oral TID BM Mikael Spray, NP   237 mL at 05/11/15 1000  . hydroxychloroquine (PLAQUENIL) tablet 200 mg  200 mg Oral Daily Flora Lipps, MD   200 mg at 05/11/15 0907  . insulin aspart (novoLOG) injection 0-5 Units  0-5 Units Subcutaneous TID WC Loletha Grayer, MD   1  Units at 05/11/15 1200  . latanoprost (XALATAN) 0.005 % ophthalmic solution 1 drop  1 drop Both Eyes QHS Loletha Grayer, MD   1 drop at 05/10/15 2119  . loperamide (IMODIUM) capsule 4 mg  4 mg Oral PRN Loletha Grayer, MD   4 mg at 05/10/15 1428  . mesalamine (PENTASA) CR capsule 1,000 mg  1,000 mg Oral BID Flora Lipps, MD   1,000 mg at 05/11/15 1000  . ondansetron (ZOFRAN) injection 4 mg  4 mg Intravenous Q6H PRN Mauri Brooklyn, MD   4 mg at 05/08/15 1938  . pantoprazole (PROTONIX) EC tablet 40 mg  40 mg Oral Daily Flora Lipps, MD   40 mg at 05/11/15 0904  . pravastatin (PRAVACHOL) tablet 20 mg  20 mg Oral q1800 Flora Lipps, MD   20 mg at 05/10/15 1652  . sodium chloride 0.9 % bolus 250 mL  250 mL Intravenous Once Raylene Miyamoto, MD   Stopped at 05/09/15 0215      Discharge Medications: Please see discharge summary for a list of discharge medications.  Relevant Imaging Results:  Relevant Lab Results:   Additional Information SSN:  [id number]  Darden Dates, LCSW

## 2015-05-11 NOTE — Progress Notes (Signed)
SNF and Non-Emergent EMS Transport Benefits:  Number called: [phone] Rep: Junie Panning Reference Number: [employer] Medicare Advantage PPO active as of 02/06/15 with no deductible.  Out of pocket max is $4000, of which $160 met so far.  In-network SNF: $0 copay for days 1-20 and a $50 copay per day for days 21-100.   Once out of pocket is reached, patient covered at 100% for remainder of 100 day benefit period.  $0 copay for professional fees and 3 day hospital stay is not required.  Josem Kaufmann is required: [phone].    Non-emergent EMS transport: $75 copay for each one way medically necessary, Medicare covered trip.  Josem Kaufmann is not required for PPO plans for CPT codes [phone] or [phone].

## 2015-05-11 NOTE — Progress Notes (Signed)
[name], female   DOB: April 23, 1930, 82 y.o.   MRN: [id number] [facility] PROGRESS NOTE  Margaret Matthews [id number] DOB: [birth year] DOA: 05/08/2015 PCP: Dion Body, MD  HPI/Subjective: Sob improving. Still with cough. Feels weak.  Objective: Filed Vitals:   05/11/15 0912 05/11/15 1420  BP: 124/48 137/48  Pulse: 84 75  Temp: 98.2 F (36.8 C) 98.3 F (36.8 C)  Resp:  20    Filed Weights   05/08/15 0839 05/09/15 0515 05/10/15 0612  Weight: 53.4 kg (117 lb 11.6 oz) 52.5 kg (115 lb 11.9 oz) 52.663 kg (116 lb 1.6 oz)    ROS: Review of Systems  Constitutional: Negative for fever and chills.  Eyes: Negative for blurred vision.  Respiratory: Positive for cough and shortness of breath.   Cardiovascular: Negative for chest pain.  Gastrointestinal: Positive for diarrhea. Negative for nausea, vomiting, abdominal pain and constipation.  Genitourinary: Negative for dysuria.  Musculoskeletal: Negative for joint pain.  Neurological: Negative for dizziness and headaches.   Exam: Physical Exam  Constitutional: She is oriented to person, place, and time.  HENT:  Nose: No mucosal edema.  Mouth/Throat: No oropharyngeal exudate or posterior oropharyngeal edema.  Eyes: Conjunctivae, EOM and lids are normal. Pupils are equal, round, and reactive to light.  Neck: No JVD present. Carotid bruit is not present. No edema present. No thyroid mass and no thyromegaly present.  Cardiovascular: S1 normal and S2 normal.  Exam reveals no gallop.   Murmur heard.  Systolic murmur is present with a grade of 4/6  Pulses:      Dorsalis pedis pulses are 2+ on the right side, and 2+ on the left side.  Respiratory: No respiratory distress. She has no wheezes. She has no rhonchi. She has rales in the right middle field, the right lower field, the left middle field and the left lower field.  GI: Soft. Bowel sounds are normal. There is no tenderness.  Musculoskeletal:       Right  ankle: She exhibits no swelling.       Left ankle: She exhibits no swelling.  Lymphadenopathy:    She has no cervical adenopathy.  Neurological: She is alert and oriented to person, place, and time. No cranial nerve deficit.  Skin: Skin is warm. No rash noted. Nails show no clubbing.  Psychiatric: She has a normal mood and affect.      Data Reviewed: Basic Metabolic Panel:  Recent Labs Lab 05/08/15 0434 05/08/15 2137 05/09/15 0319 05/11/15 0525  NA 136 134* 135  --   K 3.3* 3.8 3.8  --   CL 105 99* 102  --   CO2 25 28 28   --   GLUCOSE 291* 225* 100*  --   BUN 18 18 20   --   CREATININE 0.92 0.85 1.08* 1.20*  CALCIUM 8.8* 8.9 8.7*  --   MG  --  1.6* 2.3  --   PHOS  --  4.3 3.2  --    Liver Function Tests:  Recent Labs Lab 05/08/15 0434  AST 29  ALT 20  ALKPHOS 98  BILITOT 0.3  PROT 6.9  ALBUMIN 3.5   CBC:  Recent Labs Lab 05/08/15 0434 05/09/15 0319  WBC 14.1* 13.9*  NEUTROABS 11.5*  --   HGB 11.9* 11.2*  HCT 36.2 33.6*  MCV 88.5 85.9  PLT 259 233   Cardiac Enzymes:  Recent Labs Lab 05/08/15 0434 05/08/15 0750 05/08/15 1253 05/08/15 1837  TROPONINI 0.04* 0.16* 0.32* 0.34*  BNP (last 3 results)  Recent Labs  07/24/14 1259 05/08/15 0433 05/09/15 0319  BNP 594.0* 1278.0* 1976.0*    CBG:  Recent Labs Lab 05/10/15 1611 05/10/15 2231 05/11/15 0735 05/11/15 1123 05/11/15 1631  GLUCAP 101* 121* 89 182* 124*    Recent Results (from the past 240 hour(s))  Culture, blood (routine x 2)     Status: None (Preliminary result)   Collection Time: 05/08/15  4:48 AM  Result Value Ref Range Status   Specimen Description BLOOD BLOOD RIGHT ARM  Final   Special Requests BOTTLES DRAWN AEROBIC AND ANAEROBIC 6CC  Final   Culture NO GROWTH 3 DAYS  Final   Report Status PENDING  Incomplete  Culture, blood (routine x 2)     Status: None (Preliminary result)   Collection Time: 05/08/15  5:23 AM  Result Value Ref Range Status   Specimen Description  BLOOD RIGHT ASSIST CONTROL  Final   Special Requests   Final    BOTTLES DRAWN AEROBIC AND ANAEROBIC 12CCAERO,6CCANA   Culture NO GROWTH 3 DAYS  Final   Report Status PENDING  Incomplete  Urine culture     Status: None   Collection Time: 05/08/15  6:49 AM  Result Value Ref Range Status   Specimen Description URINE, RANDOM  Final   Special Requests NONE  Final   Culture >=100,000 COLONIES/mL KLEBSIELLA PNEUMONIAE  Final   Report Status 05/10/2015 FINAL  Final   Organism ID, Bacteria KLEBSIELLA PNEUMONIAE  Final      Susceptibility   Klebsiella pneumoniae - MIC*    AMPICILLIN 16 RESISTANT Resistant     CEFAZOLIN <=4 SENSITIVE Sensitive     CEFTRIAXONE <=1 SENSITIVE Sensitive     CIPROFLOXACIN <=0.25 SENSITIVE Sensitive     GENTAMICIN <=1 SENSITIVE Sensitive     IMIPENEM <=0.25 SENSITIVE Sensitive     NITROFURANTOIN 32 SENSITIVE Sensitive     TRIMETH/SULFA <=20 SENSITIVE Sensitive     AMPICILLIN/SULBACTAM 4 SENSITIVE Sensitive     PIP/TAZO <=4 SENSITIVE Sensitive     Extended ESBL NEGATIVE Sensitive     * >=100,000 COLONIES/mL KLEBSIELLA PNEUMONIAE  MRSA PCR Screening     Status: None   Collection Time: 05/08/15  8:39 AM  Result Value Ref Range Status   MRSA by PCR NEGATIVE NEGATIVE Final    Comment:        The GeneXpert MRSA Assay (FDA approved for NASAL specimens only), is one component of a comprehensive MRSA colonization surveillance program. It is not intended to diagnose MRSA infection nor to guide or monitor treatment for MRSA infections.   Urine culture     Status: Abnormal   Collection Time: 05/08/15  9:15 PM  Result Value Ref Range Status   Specimen Description URINE, CATHETERIZED  Final   Special Requests NONE  Final   Culture 2,000 COLONIES/mL KLEBSIELLA PNEUMONIAE (A)  Final   Report Status 05/11/2015 FINAL  Final   Organism ID, Bacteria KLEBSIELLA PNEUMONIAE (A)  Final      Susceptibility   Klebsiella pneumoniae - MIC*    AMPICILLIN 16 RESISTANT Resistant      CEFAZOLIN <=4 SENSITIVE Sensitive     CEFTRIAXONE <=1 SENSITIVE Sensitive     CIPROFLOXACIN <=0.25 SENSITIVE Sensitive     GENTAMICIN <=1 SENSITIVE Sensitive     IMIPENEM <=0.25 SENSITIVE Sensitive     NITROFURANTOIN 32 SENSITIVE Sensitive     TRIMETH/SULFA <=20 SENSITIVE Sensitive     AMPICILLIN/SULBACTAM 4 SENSITIVE Sensitive     PIP/TAZO <=4  SENSITIVE Sensitive     Extended ESBL NEGATIVE Sensitive     * 2,000 COLONIES/mL KLEBSIELLA PNEUMONIAE  C difficile quick scan w PCR reflex     Status: None   Collection Time: 05/10/15 12:17 PM  Result Value Ref Range Status   C Diff antigen NEGATIVE NEGATIVE Final   C Diff toxin NEGATIVE NEGATIVE Final   C Diff interpretation Negative for C. difficile  Final     Studies: No results found.  Scheduled Meds: . aspirin EC  81 mg Oral Daily  . carvedilol  3.125 mg Oral BID WC  . cephALEXin  250 mg Oral 3 times per day  . dorzolamide  1 drop Both Eyes Daily  . enoxaparin (LOVENOX) injection  30 mg Subcutaneous Q24H  . feeding supplement (ENSURE ENLIVE)  237 mL Oral TID BM  . hydroxychloroquine  200 mg Oral Daily  . latanoprost  1 drop Both Eyes QHS  . mesalamine  1,000 mg Oral BID  . pantoprazole  40 mg Oral Daily  . pravastatin  20 mg Oral q1800  . sodium chloride  250 mL Intravenous Once    Assessment/Plan:  1. Acute diastolic congestive heart failure with severe aortic stenosis. Overall prognosis is poor with these 2 factors. Case discussed with Dr. Ubaldo Glassing cardiology. recieved IV Lasix 20 mg IV twice a day.diuresed well. D/c lasix. Wean to RA - discontinued hydrochlorothiazide, Nitropaste and enalapril. Continue Coreg  2. Acute respiratory failure with hypoxia. Continue nasal cannula supplementation Wean to room air and or asses for home oxygen use 3. Acute cystitis with hematuria. Klebsiella growing in urine culture. Po Keflex. 4. Type 2 diabetes mellitus. As per diabetes coordinator put on specialized sliding  scale 5. Hyperlipidemia unspecified continue pravastatin 6. Gastroesophageal reflux disease without esophagitis continue Protonix 7. Weakness physical therapy evaluation-recommendation are for SNF  Code Status:     Code Status Orders        Start     Ordered   05/08/15 0652  Full code   Continuous     05/08/15 0700    Code Status History    Date Active Date Inactive Code Status Order ID Comments User Context   07/20/2014 10:30 PM 07/23/2014  5:03 PM Full Code [id number]  Gladstone Lighter, MD ED     Family Communication: Son on phone Disposition Plan: TBA  Consultants:  Cardiology  Antibiotics:  keflex  Time spent: 25 minutes  [place]

## 2015-05-11 NOTE — Progress Notes (Signed)
Physical Therapy Evaluation Patient Details Name: BENJI CABALLEROS MRN: [id number] DOB: [birth year] Today's Date: 05/11/2015   History of Present Illness  59 F with PMH of breast cancer and s/p nephrectomy for RCCa. She awoke this early AM with severe dyspnea and was brought to ED by her son where she was found to be hypoxic with pulmonary edema pattern on CXR. She was treated with diuresis and transient BiPAP with marked improvement by the time she reached the ICU. She Denies CP, fever, purulent sputum, hemoptysis, LE edema and calf tenderness  Clinical Impression  Pt able to get out of bed with Min A and transfers to [facility] with CGA; pt able to perform person hygiene with set up and SBA.  Pt ambulates 50' with cane and heavy hand held assist requiring standing rest break due to fatigue/weakness.  Pt returned to room where she had to rest again on EOB before ambulating to recliner.  Pt with productive cough throughout session and remained on 4L O2 with sats between 94-98% throughout.  Pt presents with decreased strength and endurance and limited ambulation at this time and would benefit from acute PT services to address objective findings.    Follow Up Recommendations SNF    Equipment Recommendations   (defer to post acute)    Recommendations for Other Services       Precautions / Restrictions Precautions Precautions: Fall      Mobility  Bed Mobility Overal bed mobility: Needs Assistance Bed Mobility: Supine to Sit     Supine to sit: Min assist     General bed mobility comments: Min A to elevate trunk and rotate hips to EOB, assist mostly for comfort as pt reports "needing help".  Transfers Overall transfer level: Needs assistance   Transfers: Sit to/from Stand Sit to Stand: Min guard         General transfer comment: Good body mechanics and sequencing with sit<>stand transfer, pushing up from seated surface using SPC.  Ambulation/Gait Ambulation/Gait assistance: Min  guard Ambulation Distance (Feet): 50 Feet Assistive device: Straight cane;1 person hand held assist Gait Pattern/deviations: Step-through pattern Gait velocity: decreased   General Gait Details: Slow gait cadence with standing rest break after 15' using SPC and heavy HHA on 4 L O2.  Stairs            Wheelchair Mobility    Modified Rankin (Stroke Patients Only)       Balance Overall balance assessment: Needs assistance Sitting-balance support: Single extremity supported;Feet supported Sitting balance-Leahy Scale: Good Sitting balance - Comments: maintains midline and able to weight shift   Standing balance support: Single extremity supported;During functional activity Standing balance-Leahy Scale: Fair Standing balance comment: Decreased balance reactions in standing.                             Pertinent Vitals/Pain Pain Assessment: No/denies pain    Home Living Family/patient expects to be discharged to:: Private residence Living Arrangements: Children Available Help at Discharge: Family Type of Home: House Home Access: Stairs to enter Entrance Stairs-Rails: Left Entrance Stairs-Number of Steps: 2-3 in garage Home Layout: Two level   Additional Comments: BR on second floor    Prior Function Level of Independence: Independent with assistive device(s)         Comments: cane     Hand Dominance        Extremity/Trunk Assessment   Upper Extremity Assessment: Overall WFL for tasks assessed  Lower Extremity Assessment: Overall WFL for tasks assessed;Generalized weakness         Communication   Communication: HOH  Cognition Arousal/Alertness: Awake/alert Behavior During Therapy: WFL for tasks assessed/performed Overall Cognitive Status: Within Functional Limits for tasks assessed                      General Comments General comments (skin integrity, edema, etc.): Productive cough during session; O2 at 4 L  throughout with sats between 94-98%.    Exercises        Assessment/Plan    PT Assessment Patient needs continued PT services  PT Diagnosis Difficulty walking;Generalized weakness   PT Problem List Decreased strength;Decreased activity tolerance;Decreased balance;Decreased mobility;Cardiopulmonary status limiting activity  PT Treatment Interventions Gait training;Stair training;Functional mobility training;DME instruction;Therapeutic activities;Therapeutic exercise;Balance training;Patient/family education   PT Goals (Current goals can be found in the Care Plan section) Acute Rehab PT Goals Patient Stated Goal: To go home PT Goal Formulation: With patient Time For Goal Achievement: 05/25/15 Potential to Achieve Goals: Fair    Frequency Min 2X/week   Barriers to discharge Inaccessible home environment;Decreased caregiver support Sons work during the day; bedroom on second floor    Co-evaluation               End of Session Equipment Utilized During Treatment: Gait belt Activity Tolerance: Patient limited by fatigue (weakness) Patient left: in chair;with call bell/phone within reach;with chair alarm set Nurse Communication: Mobility status         Time: [id number] PT Time Calculation (min) (ACUTE ONLY): 33 min   Charges:   PT Evaluation $PT Eval Moderate Complexity: 1 Procedure PT Treatments $Therapeutic Activity: 8-22 mins   PT G Codes:        Shalaya Swailes A Kasem Mozer 2015/06/07, 11:07 AM

## 2015-05-12 ENCOUNTER — Encounter
Admission: RE | Admit: 2015-05-12 | Discharge: 2015-05-12 | Disposition: A | Discharge status: Home / Self Care | Payer: Medicare Other | Source: Ambulatory Visit | Attending: Internal Medicine | Admitting: Internal Medicine

## 2015-05-12 LAB — GLUCOSE, CAPILLARY
GLUCOSE-CAPILLARY: 94 mg/dL (ref 65–99)
GLUCOSE-CAPILLARY: 207 mg/dL — AB (ref 65–99)

## 2015-05-12 MED ORDER — CEPHALEXIN 250 MG PO CAPS: 250.0000 mg | ORAL_CAPSULE | Freq: Three times a day (TID) | ORAL | Status: DC

## 2015-05-12 MED ORDER — CARVEDILOL 3.125 MG PO TABS: 3.1250 mg | ORAL_TABLET | Freq: Two times a day (BID) | ORAL | Status: DC

## 2015-05-12 MED ORDER — ENSURE ENLIVE PO LIQD: 237.0000 mL | Freq: Three times a day (TID) | ORAL | Status: DC

## 2015-05-12 MED ORDER — DORZOLAMIDE HCL 2 % OP SOLN: 1.0000 [drp] | Freq: Every day | OPHTHALMIC | Status: DC

## 2015-05-12 MED ORDER — LATANOPROST 0.005 % OP SOLN: 1.0000 [drp] | Freq: Every day | OPHTHALMIC | Status: AC

## 2015-05-12 NOTE — Clinical Social Work Note (Signed)
CSW presented bed offers. Pt chose [facility] CSW notified facility. [facility] is able to accept pt as they have received discharge information. RN will call report and pt's son will provide transportation to facility. CSW is signing off as no further needs identified.   Darden Dates, MSW, LCSW  Clinical Social Worker  [phone]

## 2015-05-12 NOTE — Discharge Summary (Signed)
[facility] NAME: Feyza Sandgren    MR#:  [id number]  DATE OF BIRTH:  1931/01/18  DATE OF ADMISSION:  05/08/2015 ADMITTING PHYSICIAN: No admitting provider for patient encounter.  DATE OF DISCHARGE: 05/12/15  PRIMARY CARE PHYSICIAN: Dion Body, MD    ADMISSION DIAGNOSIS:  Respiratory distress [R06.00] Hypoxia [R09.02] Elevated troponin [R79.89] Acute congestive heart failure, unspecified congestive heart failure type ([place]) [I50.9]  DISCHARGE DIAGNOSIS:  CHF acute on chronic diastolic Severe AS [place] UTI  SECONDARY DIAGNOSIS:   Past Medical History  Diagnosis Date  . Hypertension   . Arthritis   . CKD (chronic kidney disease) stage 2, GFR 60-89 ml/min     baseline creatinine 1.5  . Renal cell carcinoma ([place])   . Breast cancer ([place])   . Anemia   . Osteopenia   . GERD (gastroesophageal reflux disease)   . PUD (peptic ulcer disease)   . Glaucoma     HOSPITAL COURSE:   1. Acute diastolic congestive heart failure with severe aortic stenosis. Overall prognosis is poor with these 2 factors.  -Case discussed with Dr. Ubaldo Glassing cardiology. recieved IV Lasix 20 mg IV twice a day.diuresed well. D/c lasix. Wean to RA - resumed hydrochlorothiazide -d/c ed Nitropaste and enalapril. - Continue Coreg  2. Acute respiratory failure with hypoxia. Continue nasal cannula supplementation Wean to room air and or asses for home oxygen use 3. Acute cystitis with hematuria. Klebsiella growing in urine culture. Po Keflex. 4. Type 2 diabetes mellitus. As per diabetes coordinator put on specialized sliding scale 5. Hyperlipidemia unspecified continue pravastatin 6. Gastroesophageal reflux disease without esophagitis continue Protonix 7. Weakness physical therapy evaluation-recommendation are for SNF  Overall stable . Pt ready for d/c to SNF CONSULTS OBTAINED:  Treatment Team:  Teodoro Spray, MD  DRUG ALLERGIES:    Allergies  Allergen Reactions  . Statins Other (See Comments)    Reaction: Pt says "it was showing up in her liver."     DISCHARGE MEDICATIONS:   Current Discharge Medication List    START taking these medications   Details  carvedilol (COREG) 3.125 MG tablet Take 1 tablet (3.125 mg total) by mouth 2 (two) times daily with a meal. Qty: 60 tablet, Refills: 2    cephALEXin (KEFLEX) 250 MG capsule Take 1 capsule (250 mg total) by mouth every 8 (eight) hours. Qty: 3 capsule, Refills: 0    dorzolamide (TRUSOPT) 2 % ophthalmic solution Place 1 drop into both eyes daily. Qty: 10 mL, Refills: 12    feeding supplement, ENSURE ENLIVE, (ENSURE ENLIVE) LIQD Take 237 mLs by mouth 3 (three) times daily between meals. Qty: 237 mL, Refills: 12    latanoprost (XALATAN) 0.005 % ophthalmic solution Place 1 drop into both eyes at bedtime. Qty: 2.5 mL, Refills: 12      CONTINUE these medications which have NOT CHANGED   Details  acidophilus (RISAQUAD) CAPS capsule Take 1 capsule by mouth 2 (two) times daily. Qty: 15 capsule, Refills: 0    aspirin EC 81 MG tablet Take 81 mg by mouth daily.    beta carotene w/minerals (OCUVITE) tablet Take 1 tablet by mouth every evening.    Calcium Carbonate-Vit D-Min (CALCIUM 600+D PLUS MINERALS PO) Take 1 tablet by mouth 2 (two) times daily.     CRANBERRY PO Take 1 capsule by mouth every evening.    enalapril (VASOTEC) 5 MG tablet Take 1 tablet (5 mg total) by mouth 2 (two) times  daily. Qty: 60 tablet, Refills: 0    hydrochlorothiazide (MICROZIDE) 12.5 MG capsule Take 25 mg by mouth daily.    hydroxychloroquine (PLAQUENIL) 200 MG tablet Take 200 mg by mouth daily.    IRON-VITAMIN C PO Take 1 tablet by mouth every evening.    Loperamide HCl (IMODIUM PO) Take 1 tablet by mouth daily as needed (for diarrhea).     mesalamine (PENTASA) 250 MG CR capsule Take 4 capsules (1,000 mg total) by mouth 2 (two) times daily. Qty: 30 capsule, Refills: 2     pravastatin (PRAVACHOL) 20 MG tablet Take 20 mg by mouth every evening.      STOP taking these medications     metroNIDAZOLE (FLAGYL) 500 MG tablet         If you experience worsening of your admission symptoms, develop shortness of breath, life threatening emergency, suicidal or homicidal thoughts you must seek medical attention immediately by calling 911 or calling your MD immediately  if symptoms less severe.  You Must read complete instructions/literature along with all the possible adverse reactions/side effects for all the Medicines you take and that have been prescribed to you. Take any new Medicines after you have completely understood and accept all the possible adverse reactions/side effects.   Please note  You were cared for by a hospitalist during your hospital stay. If you have any questions about your discharge medications or the care you received while you were in the hospital after you are discharged, you can call the unit and asked to speak with the hospitalist on call if the hospitalist that took care of you is not available. Once you are discharged, your primary care physician will handle any further medical issues. Please note that NO REFILLS for any discharge medications will be authorized once you are discharged, as it is imperative that you return to your primary care physician (or establish a relationship with a primary care physician if you do not have one) for your aftercare needs so that they can reassess your need for medications and monitor your lab values. Today   SUBJECTIVE   Doing well  VITAL SIGNS:  Blood pressure 126/53, pulse 73, temperature 97.9 F (36.6 C), temperature source Oral, resp. rate 18, height 5\' 1"  (1.549 m), weight 49.261 kg (108 lb 9.6 oz), SpO2 94 %.  I/O:   Intake/Output Summary (Last 24 hours) at 05/12/15 1309 Last data filed at 05/12/15 0900  Gross per 24 hour  Intake    240 ml  Output      0 ml  Net    240 ml    PHYSICAL  EXAMINATION:  GENERAL:  82 y.o.-year-old patient lying in the bed with no acute distress.  EYES: Pupils equal, round, reactive to light and accommodation. No scleral icterus. Extraocular muscles intact.  HEENT: Head atraumatic, normocephalic. Oropharynx and nasopharynx clear.  NECK:  Supple, no jugular venous distention. No thyroid enlargement, no tenderness.  LUNGS: Normal breath sounds bilaterally, no wheezing, rales,rhonchi or crepitation. No use of accessory muscles of respiration.  CARDIOVASCULAR: S1, S2 normal. SM+murmurs, rubs, or gallops.  ABDOMEN: Soft, non-tender, non-distended. Bowel sounds present. No organomegaly or mass.  EXTREMITIES: No pedal edema, cyanosis, or clubbing.  NEUROLOGIC: Cranial nerves II through XII are intact. Muscle strength 5/5 in all extremities. Sensation intact. Gait not checked.  PSYCHIATRIC: The patient is alert and oriented x 3.  SKIN: No obvious rash, lesion, or ulcer.   DATA REVIEW:   CBC   Recent Labs Lab 05/09/15  0319  WBC 13.9*  HGB 11.2*  HCT 33.6*  PLT 233    Chemistries   Recent Labs Lab 05/08/15 0434  05/09/15 0319 05/11/15 0525  NA 136  < > 135  --   K 3.3*  < > 3.8  --   CL 105  < > 102  --   CO2 25  < > 28  --   GLUCOSE 291*  < > 100*  --   BUN 18  < > 20  --   CREATININE 0.92  < > 1.08* 1.20*  CALCIUM 8.8*  < > 8.7*  --   MG  --   < > 2.3  --   AST 29  --   --   --   ALT 20  --   --   --   ALKPHOS 98  --   --   --   BILITOT 0.3  --   --   --   < > = values in this interval not displayed.  Microbiology Results   Recent Results (from the past 240 hour(s))  Culture, blood (routine x 2)     Status: None (Preliminary result)   Collection Time: 05/08/15  4:48 AM  Result Value Ref Range Status   Specimen Description BLOOD BLOOD RIGHT ARM  Final   Special Requests BOTTLES DRAWN AEROBIC AND ANAEROBIC 6CC  Final   Culture NO GROWTH 4 DAYS  Final   Report Status PENDING  Incomplete  Culture, blood (routine x 2)      Status: None (Preliminary result)   Collection Time: 05/08/15  5:23 AM  Result Value Ref Range Status   Specimen Description BLOOD RIGHT ASSIST CONTROL  Final   Special Requests   Final    BOTTLES DRAWN AEROBIC AND ANAEROBIC 12CCAERO,6CCANA   Culture NO GROWTH 4 DAYS  Final   Report Status PENDING  Incomplete  Urine culture     Status: None   Collection Time: 05/08/15  6:49 AM  Result Value Ref Range Status   Specimen Description URINE, RANDOM  Final   Special Requests NONE  Final   Culture >=100,000 COLONIES/mL KLEBSIELLA PNEUMONIAE  Final   Report Status 05/10/2015 FINAL  Final   Organism ID, Bacteria KLEBSIELLA PNEUMONIAE  Final      Susceptibility   Klebsiella pneumoniae - MIC*    AMPICILLIN 16 RESISTANT Resistant     CEFAZOLIN <=4 SENSITIVE Sensitive     CEFTRIAXONE <=1 SENSITIVE Sensitive     CIPROFLOXACIN <=0.25 SENSITIVE Sensitive     GENTAMICIN <=1 SENSITIVE Sensitive     IMIPENEM <=0.25 SENSITIVE Sensitive     NITROFURANTOIN 32 SENSITIVE Sensitive     TRIMETH/SULFA <=20 SENSITIVE Sensitive     AMPICILLIN/SULBACTAM 4 SENSITIVE Sensitive     PIP/TAZO <=4 SENSITIVE Sensitive     Extended ESBL NEGATIVE Sensitive     * >=100,000 COLONIES/mL KLEBSIELLA PNEUMONIAE  MRSA PCR Screening     Status: None   Collection Time: 05/08/15  8:39 AM  Result Value Ref Range Status   MRSA by PCR NEGATIVE NEGATIVE Final    Comment:        The GeneXpert MRSA Assay (FDA approved for NASAL specimens only), is one component of a comprehensive MRSA colonization surveillance program. It is not intended to diagnose MRSA infection nor to guide or monitor treatment for MRSA infections.   Urine culture     Status: Abnormal   Collection Time: 05/08/15  9:15 PM  Result Value Ref  Range Status   Specimen Description URINE, CATHETERIZED  Final   Special Requests NONE  Final   Culture 2,000 COLONIES/mL KLEBSIELLA PNEUMONIAE (A)  Final   Report Status 05/11/2015 FINAL  Final   Organism ID,  Bacteria KLEBSIELLA PNEUMONIAE (A)  Final      Susceptibility   Klebsiella pneumoniae - MIC*    AMPICILLIN 16 RESISTANT Resistant     CEFAZOLIN <=4 SENSITIVE Sensitive     CEFTRIAXONE <=1 SENSITIVE Sensitive     CIPROFLOXACIN <=0.25 SENSITIVE Sensitive     GENTAMICIN <=1 SENSITIVE Sensitive     IMIPENEM <=0.25 SENSITIVE Sensitive     NITROFURANTOIN 32 SENSITIVE Sensitive     TRIMETH/SULFA <=20 SENSITIVE Sensitive     AMPICILLIN/SULBACTAM 4 SENSITIVE Sensitive     PIP/TAZO <=4 SENSITIVE Sensitive     Extended ESBL NEGATIVE Sensitive     * 2,000 COLONIES/mL KLEBSIELLA PNEUMONIAE  C difficile quick scan w PCR reflex     Status: None   Collection Time: 05/10/15 12:17 PM  Result Value Ref Range Status   C Diff antigen NEGATIVE NEGATIVE Final   C Diff toxin NEGATIVE NEGATIVE Final   C Diff interpretation Negative for C. difficile  Final    RADIOLOGY:  No results found.   Management plans discussed with the patient, family and they are in agreement.  CODE STATUS:     Code Status Orders        Start     Ordered   05/08/15 [id number]  Full code   Continuous     05/08/15 0700    Code Status History    Date Active Date Inactive Code Status Order ID Comments User Context   07/20/2014 10:30 PM 07/23/2014  5:03 PM Full Code [id number]  Gladstone Lighter, MD ED      TOTAL TIME TAKING CARE OF THIS PATIENT: 40 minutes.    Eutimio Gharibian M.D on 05/12/2015 at 1:09 PM  Between 7am to 6pm - Pager - [phone] After 6pm go to [email or web address] - password [email or web address] [facility]  Office  [phone]  CC: Primary care physician; Dion Body, MD

## 2015-05-12 NOTE — Clinical Social Work Placement (Signed)
   CLINICAL SOCIAL WORK PLACEMENT  NOTE  Date:  05/12/2015  Patient Details  Name: Margaret Matthews MRN: [id number] Date of Birth: 1930-02-24  Clinical Social Work is seeking post-discharge placement for this patient at the [place] level of care (*CSW will initial, date and re-position this form in  chart as items are completed):  Yes   Patient/family provided with [facility] Work Department's list of facilities offering this level of care within the geographic area requested by the patient (or if unable, by the patient's family).  Yes   Patient/family informed of their freedom to choose among providers that offer the needed level of care, that participate in Medicare, Medicaid or managed care program needed by the patient, have an available bed and are willing to accept the patient.  Yes   Patient/family informed of [facility]'s ownership interest in [facility] and [facility], as well as of the fact that they are under no obligation to receive care at these facilities.  PASRR submitted to EDS on 05/12/15     PASRR number received on 05/12/15     Existing PASRR number confirmed on       FL2 transmitted to all facilities in geographic area requested by pt/family on 05/11/15     FL2 transmitted to all facilities within larger geographic area on       Patient informed that his/her managed care company has contracts with or will negotiate with certain facilities, including the following:        Yes   Patient/family informed of bed offers received.  Patient chooses bed at [facility]     Physician recommends and patient chooses bed at  [facility])    Patient to be transferred to [facility] on 05/12/15.  Patient to be transferred to facility by Pt's son, Gwyndolyn Saxon     Patient family notified on 05/12/15 of transfer.  Name of family member notified:  Pt's son, Gwyndolyn Saxon     PHYSICIAN       Additional Comment:     _______________________________________________ Darden Dates, LCSW 05/12/2015, 3:41 PM

## 2015-05-12 NOTE — Clinical Social Work Note (Signed)
Clinical Social Work Assessment  Patient Details  Name: Margaret Matthews MRN: [id number] Date of Birth: Aug 28, 1930  Date of referral:  05/11/15               Reason for consult:  Facility Placement                Permission sought to share information with:  Family Supports Permission granted to share information::  Yes, Verbal Permission Granted  Name::      Bionca Mckey, son   Housing/Transportation Living arrangements for the past 2 months:  [place] of Information:  Patient Patient Interpreter Needed:  None Criminal Activity/Legal Involvement Pertinent to Current Situation/Hospitalization:  No - Comment as needed Significant Relationships:  Adult Children Lives with:  Self Do you feel safe going back to the place where you live?  Yes Need for family participation in patient care:  Yes (Comment)  Care giving concerns:  No care giving concerns identified.   Social Worker assessment / plan:  CSW met with pt to address consult for SNF. PT is recommending STR at SNF. CSW introduced herself and explained role of social work. CSW also explained the process of discharging to SNF. CSW initiated a SNF search and will follow up with bed offers. CSW will continue to follow.   Employment status:  Retired Nurse, adult PT Recommendations:  [place] / Referral to community resources:  [place]  Patient/Family's Response to care:  Pt and family were appreciative of CSW support.   Patient/Family's Understanding of and Emotional Response to Diagnosis, Current Treatment, and Prognosis:  Pt understands that she would benefit from STR.   Emotional Assessment Appearance:  Appears stated age, Well-Groomed Attitude/Demeanor/Rapport:  Other (Appropriate) Affect (typically observed):  Accepting, Adaptable, Pleasant Orientation:  Oriented to Self, Oriented to Place, Oriented to  Time, Oriented to  Situation Alcohol / Substance use:   No Psych involvement (Current and /or in the community):  No (Comment)  Discharge Needs  Concerns to be addressed:  Adjustment to Illness Readmission within the last 30 days:  No Current discharge risk:  Chronically ill Barriers to Discharge:  No Barriers Identified, Continued Medical Work up   Darden Dates, LCSW 05/12/2015, 3:34 PM

## 2015-05-12 NOTE — Progress Notes (Signed)
Pt for discharge to [facility] place room 208. Alert. Hoh. No resp distress.  Pt on r/a sats  94-95%  Sl x2 d/cd. No voiced c/ o. Son transported pt via car. Report called to  Nurse at  [facility]

## 2015-05-12 NOTE — Care Management Important Message (Signed)
Important Message  Patient Details  Name: Margaret Matthews MRN: [id number] Date of Birth: 1930/04/27   Medicare Important Message Given:  Yes    [name] 05/12/2015, 1:44 PM

## 2015-05-13 LAB — CULTURE, BLOOD (ROUTINE X 2)
Culture: NO GROWTH
Culture: NO GROWTH

## 2015-05-27 ENCOUNTER — Telehealth: Payer: Self-pay | Admitting: Family

## 2015-05-27 ENCOUNTER — Ambulatory Visit: Payer: Medicare Other | Admitting: Family

## 2015-05-27 NOTE — Telephone Encounter (Signed)
Patient did not show for her [facility] appointment on 05/27/15. Will attempt to reschedule.

## 2015-05-30 ENCOUNTER — Emergency Department: Payer: Medicare Other

## 2015-05-30 ENCOUNTER — Inpatient Hospital Stay
Admission: EM | Admit: 2015-05-30 | Discharge: 2015-06-03 | DRG: 641 | Disposition: A | Discharge status: Home / Self Care | Payer: Medicare Other | Attending: Internal Medicine | Admitting: Internal Medicine

## 2015-05-30 DIAGNOSIS — I130 Hypertensive heart and chronic kidney disease with heart failure and stage 1 through stage 4 chronic kidney disease, or unspecified chronic kidney disease: Secondary | ICD-10-CM | POA: Diagnosis present

## 2015-05-30 DIAGNOSIS — K219 Gastro-esophageal reflux disease without esophagitis: Secondary | ICD-10-CM | POA: Diagnosis present

## 2015-05-30 DIAGNOSIS — E785 Hyperlipidemia, unspecified: Secondary | ICD-10-CM | POA: Diagnosis present

## 2015-05-30 DIAGNOSIS — I350 Nonrheumatic aortic (valve) stenosis: Secondary | ICD-10-CM | POA: Diagnosis present

## 2015-05-30 DIAGNOSIS — Z79899 Other long term (current) drug therapy: Secondary | ICD-10-CM

## 2015-05-30 DIAGNOSIS — Z7982 Long term (current) use of aspirin: Secondary | ICD-10-CM | POA: Diagnosis not present

## 2015-05-30 DIAGNOSIS — E1122 Type 2 diabetes mellitus with diabetic chronic kidney disease: Secondary | ICD-10-CM | POA: Diagnosis present

## 2015-05-30 DIAGNOSIS — Z888 Allergy status to other drugs, medicaments and biological substances status: Secondary | ICD-10-CM

## 2015-05-30 DIAGNOSIS — E871 Hypo-osmolality and hyponatremia: Principal | ICD-10-CM

## 2015-05-30 DIAGNOSIS — Z87891 Personal history of nicotine dependence: Secondary | ICD-10-CM | POA: Diagnosis not present

## 2015-05-30 DIAGNOSIS — H409 Unspecified glaucoma: Secondary | ICD-10-CM | POA: Diagnosis present

## 2015-05-30 DIAGNOSIS — Z8711 Personal history of peptic ulcer disease: Secondary | ICD-10-CM

## 2015-05-30 DIAGNOSIS — R197 Diarrhea, unspecified: Secondary | ICD-10-CM | POA: Diagnosis present

## 2015-05-30 DIAGNOSIS — Z85528 Personal history of other malignant neoplasm of kidney: Secondary | ICD-10-CM

## 2015-05-30 DIAGNOSIS — N390 Urinary tract infection, site not specified: Secondary | ICD-10-CM

## 2015-05-30 DIAGNOSIS — I5032 Chronic diastolic (congestive) heart failure: Secondary | ICD-10-CM | POA: Diagnosis present

## 2015-05-30 DIAGNOSIS — H903 Sensorineural hearing loss, bilateral: Secondary | ICD-10-CM | POA: Diagnosis present

## 2015-05-30 DIAGNOSIS — M8580 Other specified disorders of bone density and structure, unspecified site: Secondary | ICD-10-CM | POA: Diagnosis present

## 2015-05-30 DIAGNOSIS — E860 Dehydration: Secondary | ICD-10-CM | POA: Diagnosis present

## 2015-05-30 DIAGNOSIS — Z853 Personal history of malignant neoplasm of breast: Secondary | ICD-10-CM | POA: Diagnosis not present

## 2015-05-30 DIAGNOSIS — M1990 Unspecified osteoarthritis, unspecified site: Secondary | ICD-10-CM | POA: Diagnosis present

## 2015-05-30 DIAGNOSIS — N182 Chronic kidney disease, stage 2 (mild): Secondary | ICD-10-CM | POA: Diagnosis present

## 2015-05-30 DIAGNOSIS — R55 Syncope and collapse: Secondary | ICD-10-CM

## 2015-05-30 LAB — URINALYSIS COMPLETE WITH MICROSCOPIC (ARMC ONLY)
Bilirubin Urine: NEGATIVE
Glucose, UA: NEGATIVE mg/dL
Hgb urine dipstick: NEGATIVE
Ketones, ur: NEGATIVE mg/dL
Nitrite: NEGATIVE
Protein, ur: NEGATIVE mg/dL
Specific Gravity, Urine: 1.009 (ref 1.005–1.030)
pH: 7 (ref 5.0–8.0)

## 2015-05-30 LAB — COMPREHENSIVE METABOLIC PANEL
ALT: 13 U/L — ABNORMAL LOW (ref 14–54)
AST: 24 U/L (ref 15–41)
Albumin: 3.6 g/dL (ref 3.5–5.0)
Alkaline Phosphatase: 75 U/L (ref 38–126)
Anion gap: 9 (ref 5–15)
BUN: 34 mg/dL — ABNORMAL HIGH (ref 6–20)
CO2: 26 mmol/L (ref 22–32)
Calcium: 9.5 mg/dL (ref 8.9–10.3)
Chloride: 86 mmol/L — ABNORMAL LOW (ref 101–111)
Creatinine, Ser: 1.24 mg/dL — ABNORMAL HIGH (ref 0.44–1.00)
GFR calc Af Amer: 45 mL/min — ABNORMAL LOW (ref >60)
GFR calc non Af Amer: 39 mL/min — ABNORMAL LOW (ref >60)
Glucose, Bld: 214 mg/dL — ABNORMAL HIGH (ref 65–99)
Potassium: 4.7 mmol/L (ref 3.5–5.1)
Sodium: 121 mmol/L — ABNORMAL LOW (ref 135–145)
Total Bilirubin: 0.5 mg/dL (ref 0.3–1.2)
Total Protein: 7.2 g/dL (ref 6.5–8.1)

## 2015-05-30 LAB — C DIFFICILE QUICK SCREEN W PCR REFLEX
C DIFFICLE (CDIFF) ANTIGEN: NEGATIVE
C Diff interpretation: NEGATIVE
C Diff toxin: NEGATIVE

## 2015-05-30 LAB — CBC WITH DIFFERENTIAL/PLATELET
Basophils Absolute: 0.1 10*3/uL (ref 0–0.1)
Basophils Relative: 1 %
Eosinophils Absolute: 0 10*3/uL (ref 0–0.7)
Eosinophils Relative: 0 %
HCT: 34 % — ABNORMAL LOW (ref 35.0–47.0)
Hemoglobin: 11.6 g/dL — ABNORMAL LOW (ref 12.0–16.0)
Lymphocytes Relative: 10 %
Lymphs Abs: 0.9 10*3/uL — ABNORMAL LOW (ref 1.0–3.6)
MCH: 28.4 pg (ref 26.0–34.0)
MCHC: 34 g/dL (ref 32.0–36.0)
MCV: 83.7 fL (ref 80.0–100.0)
Monocytes Absolute: 0.8 10*3/uL (ref 0.2–0.9)
Monocytes Relative: 8 %
Neutro Abs: 7.8 10*3/uL — ABNORMAL HIGH (ref 1.4–6.5)
Neutrophils Relative %: 81 %
Platelets: 305 10*3/uL (ref 150–440)
RBC: 4.07 MIL/uL (ref 3.80–5.20)
RDW: 13.6 % (ref 11.5–14.5)
WBC: 9.6 10*3/uL (ref 3.6–11.0)

## 2015-05-30 LAB — TROPONIN I: Troponin I: 0.05 ng/mL — ABNORMAL HIGH (ref <0.031)

## 2015-05-30 LAB — MRSA PCR SCREENING: MRSA by PCR: NEGATIVE

## 2015-05-30 MED ORDER — ENALAPRILAT 1.25 MG/ML IV SOLN
INTRAVENOUS | Status: AC
  Administered 2015-05-30: 2.5 mg
  Filled 2015-05-30: qty 2

## 2015-05-30 MED ORDER — DEXTROSE 5 % IV SOLN
1.0000 g | INTRAVENOUS | Status: DC
  Administered 2015-05-31 – 2015-06-02 (×3): 1 g via INTRAVENOUS
  Filled 2015-05-30 (×3): qty 10

## 2015-05-30 MED ORDER — CALCIUM CARBONATE-VITAMIN D 500-200 MG-UNIT PO TABS
1.0000 | ORAL_TABLET | Freq: Two times a day (BID) | ORAL | Status: DC
  Administered 2015-05-30 – 2015-06-03 (×9): 1 via ORAL
  Filled 2015-05-30 (×9): qty 1

## 2015-05-30 MED ORDER — DOCUSATE SODIUM 100 MG PO CAPS
100.0000 mg | ORAL_CAPSULE | Freq: Two times a day (BID) | ORAL | Status: DC
  Administered 2015-05-30 – 2015-05-31 (×2): 100 mg via ORAL
  Filled 2015-05-30 (×3): qty 1

## 2015-05-30 MED ORDER — ASPIRIN EC 81 MG PO TBEC
81.0000 mg | DELAYED_RELEASE_TABLET | Freq: Every day | ORAL | Status: DC
  Administered 2015-05-30 – 2015-06-03 (×5): 81 mg via ORAL
  Filled 2015-05-30 (×5): qty 1

## 2015-05-30 MED ORDER — HYDROXYCHLOROQUINE SULFATE 200 MG PO TABS
200.0000 mg | ORAL_TABLET | Freq: Every day | ORAL | Status: DC
  Administered 2015-05-30 – 2015-06-03 (×5): 200 mg via ORAL
  Filled 2015-05-30 (×5): qty 1

## 2015-05-30 MED ORDER — RISAQUAD PO CAPS
1.0000 | ORAL_CAPSULE | Freq: Two times a day (BID) | ORAL | Status: DC
  Administered 2015-05-30 – 2015-06-03 (×9): 1 via ORAL
  Filled 2015-05-30 (×10): qty 1

## 2015-05-30 MED ORDER — CALCIUM 600+D PLUS MINERALS 600-400 MG-UNIT PO TABS: ORAL_TABLET | Freq: Two times a day (BID) | ORAL | Status: DC

## 2015-05-30 MED ORDER — CRANBERRY 250 MG PO TABS: ORAL_TABLET | Freq: Every evening | ORAL | Status: DC

## 2015-05-30 MED ORDER — DORZOLAMIDE HCL 2 % OP SOLN
1.0000 [drp] | Freq: Every day | OPHTHALMIC | Status: DC
  Administered 2015-05-31 – 2015-06-03 (×5): 1 [drp] via OPHTHALMIC
  Filled 2015-05-30: qty 10

## 2015-05-30 MED ORDER — ENSURE ENLIVE PO LIQD
237.0000 mL | Freq: Three times a day (TID) | ORAL | Status: DC
  Administered 2015-05-30 – 2015-06-03 (×10): 237 mL via ORAL

## 2015-05-30 MED ORDER — BISACODYL 5 MG PO TBEC: 5.0000 mg | DELAYED_RELEASE_TABLET | Freq: Every day | ORAL | Status: DC | PRN

## 2015-05-30 MED ORDER — ONDANSETRON HCL 4 MG PO TABS: 4.0000 mg | ORAL_TABLET | Freq: Four times a day (QID) | ORAL | Status: DC | PRN

## 2015-05-30 MED ORDER — HEPARIN SODIUM (PORCINE) 5000 UNIT/ML IJ SOLN
5000.0000 [IU] | Freq: Three times a day (TID) | INTRAMUSCULAR | Status: DC
  Administered 2015-05-30 – 2015-06-01 (×6): 5000 [IU] via SUBCUTANEOUS
  Filled 2015-05-30 (×7): qty 1

## 2015-05-30 MED ORDER — ACETAMINOPHEN 325 MG PO TABS: 650.0000 mg | ORAL_TABLET | Freq: Four times a day (QID) | ORAL | Status: DC | PRN

## 2015-05-30 MED ORDER — DEXTROSE 5 % IV SOLN
1.0000 g | Freq: Once | INTRAVENOUS | Status: AC
  Administered 2015-05-30: 1 g via INTRAVENOUS
  Filled 2015-05-30: qty 10

## 2015-05-30 MED ORDER — IRON-VITAMIN C 100-250 MG PO TABS: ORAL_TABLET | Freq: Every evening | ORAL | Status: DC

## 2015-05-30 MED ORDER — ACETAMINOPHEN 650 MG RE SUPP: 650.0000 mg | Freq: Four times a day (QID) | RECTAL | Status: DC | PRN

## 2015-05-30 MED ORDER — SODIUM CHLORIDE 0.9 % IV SOLN
INTRAVENOUS | Status: DC
  Administered 2015-05-30 – 2015-06-02 (×7): via INTRAVENOUS

## 2015-05-30 MED ORDER — TRAZODONE HCL 50 MG PO TABS: 25.0000 mg | ORAL_TABLET | Freq: Every evening | ORAL | Status: DC | PRN

## 2015-05-30 MED ORDER — MESALAMINE ER 250 MG PO CPCR
1000.0000 mg | ORAL_CAPSULE | Freq: Two times a day (BID) | ORAL | Status: DC
  Administered 2015-05-30 – 2015-06-03 (×8): 1000 mg via ORAL
  Filled 2015-05-30 (×11): qty 4

## 2015-05-30 MED ORDER — HYDROCODONE-ACETAMINOPHEN 5-325 MG PO TABS: 1.0000 | ORAL_TABLET | ORAL | Status: DC | PRN

## 2015-05-30 MED ORDER — FERROUS SULFATE 325 (65 FE) MG PO TABS
325.0000 mg | ORAL_TABLET | Freq: Every evening | ORAL | Status: DC
  Administered 2015-05-30 – 2015-06-02 (×4): 325 mg via ORAL
  Filled 2015-05-30 (×4): qty 1

## 2015-05-30 MED ORDER — ENALAPRIL MALEATE 5 MG PO TABS
5.0000 mg | ORAL_TABLET | Freq: Two times a day (BID) | ORAL | Status: DC
  Administered 2015-05-30 – 2015-06-03 (×8): 5 mg via ORAL
  Filled 2015-05-30 (×8): qty 1

## 2015-05-30 MED ORDER — CARVEDILOL 6.25 MG PO TABS
3.1250 mg | ORAL_TABLET | Freq: Two times a day (BID) | ORAL | Status: DC
  Administered 2015-05-30 – 2015-06-03 (×8): 3.125 mg via ORAL
  Filled 2015-05-30 (×9): qty 1

## 2015-05-30 MED ORDER — ONDANSETRON HCL 4 MG/2ML IJ SOLN: 4.0000 mg | Freq: Four times a day (QID) | INTRAMUSCULAR | Status: DC | PRN

## 2015-05-30 MED ORDER — PROSIGHT PO TABS
1.0000 | ORAL_TABLET | Freq: Every evening | ORAL | Status: DC
  Administered 2015-05-30 – 2015-06-02 (×4): 1 via ORAL
  Filled 2015-05-30 (×6): qty 1

## 2015-05-30 MED ORDER — PRAVASTATIN SODIUM 20 MG PO TABS
20.0000 mg | ORAL_TABLET | Freq: Every evening | ORAL | Status: DC
  Administered 2015-05-30 – 2015-06-02 (×4): 20 mg via ORAL
  Filled 2015-05-30 (×5): qty 1

## 2015-05-30 MED ORDER — LATANOPROST 0.005 % OP SOLN
1.0000 [drp] | Freq: Every day | OPHTHALMIC | Status: DC
  Administered 2015-05-30 – 2015-06-02 (×4): 1 [drp] via OPHTHALMIC
  Filled 2015-05-30: qty 2.5

## 2015-05-30 MED ORDER — VITAMIN C 500 MG PO TABS
250.0000 mg | ORAL_TABLET | Freq: Every evening | ORAL | Status: DC
  Administered 2015-05-30 – 2015-06-02 (×4): 250 mg via ORAL
  Filled 2015-05-30 (×4): qty 1

## 2015-05-30 NOTE — ED Provider Notes (Signed)
[facility] Emergency Department Provider Note  ____________________________________________  Time seen: Seen upon arrival to the emergency department  I have reviewed the triage vital signs and the nursing notes.   HISTORY  Chief Complaint Loss of Consciousness   HPI Margaret Matthews is a 82 y.o. female with a historyof chronic kidney disease and hypertension who is presenting to the emergency department today after an episode of unresponsiveness while eating breakfast. She says that she has been dizzy since this morning. However, does not sit in the room is spinning. I asked her if it is more lightheadedness and she says that she is unable to differentiate the 2. She is denying any pain at this time. Does not remember the details of the events that happened earlier this morning. Denies any nausea or vomiting. Per EMS, the patient was unresponsive for 5 minutes and then was able to be aroused and return to her baseline mental status. EMS also reports that she slept poorly last night and had been tired earlier this morning.   Past Medical History  Diagnosis Date  . Hypertension   . Arthritis   . CKD (chronic kidney disease) stage 2, GFR 60-89 ml/min     baseline creatinine 1.5  . Renal cell carcinoma ([place])   . Breast cancer ([place])   . Anemia   . Osteopenia   . GERD (gastroesophageal reflux disease)   . PUD (peptic ulcer disease)   . Glaucoma     Patient Active Problem List   Diagnosis Date Noted  . CHF (congestive heart failure) ([place]) 05/08/2015  . ARF (acute renal failure) ([place]) 07/20/2014    Past Surgical History  Procedure Laterality Date  . Breast surgery      Left mastectomy  . Nephrectomy      Right nephrectomy  . Hemiarthroplasty shoulder fracture      Right shoulder  . Hip surgery      Left hip  . Colonoscopy      Current Outpatient Rx  Name  Route  Sig  Dispense  Refill  . acidophilus (RISAQUAD) CAPS capsule   Oral   Take 1  capsule by mouth 2 (two) times daily.   15 capsule   0     Can be substituted to any equivalent   . aspirin EC 81 MG tablet   Oral   Take 81 mg by mouth daily.         . beta carotene w/minerals (OCUVITE) tablet   Oral   Take 1 tablet by mouth every evening.         . Calcium Carbonate-Vit D-Min (CALCIUM 600+D PLUS MINERALS PO)   Oral   Take 1 tablet by mouth 2 (two) times daily.          . carvedilol (COREG) 3.125 MG tablet   Oral   Take 1 tablet (3.125 mg total) by mouth 2 (two) times daily with a meal.   60 tablet   2   . cephALEXin (KEFLEX) 250 MG capsule   Oral   Take 1 capsule (250 mg total) by mouth every 8 (eight) hours.   3 capsule   0   . CRANBERRY PO   Oral   Take 1 capsule by mouth every evening.         . dorzolamide (TRUSOPT) 2 % ophthalmic solution   Both Eyes   Place 1 drop into both eyes daily.   10 mL   12   . enalapril (VASOTEC)  5 MG tablet   Oral   Take 1 tablet (5 mg total) by mouth 2 (two) times daily.   60 tablet   0   . feeding supplement, ENSURE ENLIVE, (ENSURE ENLIVE) LIQD   Oral   Take 237 mLs by mouth 3 (three) times daily between meals.   237 mL   12   . hydrochlorothiazide (MICROZIDE) 12.5 MG capsule   Oral   Take 25 mg by mouth daily.         . hydroxychloroquine (PLAQUENIL) 200 MG tablet   Oral   Take 200 mg by mouth daily.         Marland Kitchen IRON-VITAMIN C PO   Oral   Take 1 tablet by mouth every evening.         . latanoprost (XALATAN) 0.005 % ophthalmic solution   Both Eyes   Place 1 drop into both eyes at bedtime.   2.5 mL   12   . Loperamide HCl (IMODIUM PO)   Oral   Take 1 tablet by mouth daily as needed (for diarrhea).          . mesalamine (PENTASA) 250 MG CR capsule   Oral   Take 4 capsules (1,000 mg total) by mouth 2 (two) times daily.   30 capsule   2   . pravastatin (PRAVACHOL) 20 MG tablet   Oral   Take 20 mg by mouth every evening.           Allergies Statins  Family History   Problem Relation Age of Onset  . CAD Mother   . CVA Father   . CAD Sister     Social History Social History  Substance Use Topics  . Smoking status: Former Research scientist (life sciences)  . Smokeless tobacco: Not on file  . Alcohol Use: No    Review of Systems Constitutional: No fever/chills Eyes: No visual changes. ENT: No sore throat. Cardiovascular: Denies chest pain. Respiratory: Denies shortness of breath. Gastrointestinal: No abdominal pain.  No nausea, no vomiting.  No diarrhea.  No constipation. Genitourinary: Negative for dysuria. Musculoskeletal: Negative for back pain. Skin: Negative for rash. Neurological: Negative for headaches, focal weakness or numbness.  10-point ROS otherwise negative.  ____________________________________________   PHYSICAL EXAM:  VITAL SIGNS: ED Triage Vitals  Enc Vitals Group     BP 05/30/15 0945 195/76 mmHg     Pulse Rate 05/30/15 0945 85     Resp 05/30/15 0945 21     Temp 05/30/15 0945 98.1 F (36.7 C)     Temp Source 05/30/15 0945 Oral     SpO2 05/30/15 0945 99 %     Weight 05/30/15 0945 115 lb 11.2 oz (52.481 kg)     Height --      Head Cir --      Peak Flow --      Pain Score --      Pain Loc --      Pain Edu? --      Excl. in [place]? --     Constitutional: Alert and oriented. Well appearing and in no acute distress. Eyes: Conjunctivae are normal. PERRL. EOMI. Head: Atraumatic.Normal TM on the left. Cerumen to the right external canal. Nose: No congestion/rhinnorhea. Mouth/Throat: Mucous membranes are moist.  Oropharynx non-erythematous. Neck: No stridor.   Cardiovascular: Normal rate, regular rhythm. Grossly normal heart sounds.  Good peripheral circulation. Respiratory: Normal respiratory effort.  No retractions. Lungs CTAB. Gastrointestinal: Soft and nontender. No distention. No CVA tenderness. Musculoskeletal: No  lower extremity tenderness nor edema.  No joint effusions. Neurologic:  Normal speech and language. No gross focal  neurologic deficits are appreciated. No nystagmus. No ataxia on finger to nose testing or heel to shin testing. Skin:  Skin is warm, dry and intact. No rash noted. Psychiatric: Mood and affect are normal. Speech and behavior are normal.  ____________________________________________   LABS (all labs ordered are listed, but only abnormal results are displayed)  Labs Reviewed  CBC WITH DIFFERENTIAL/PLATELET - Abnormal; Notable for the following:    Hemoglobin 11.6 (*)    HCT 34.0 (*)    Neutro Abs 7.8 (*)    Lymphs Abs 0.9 (*)    All other components within normal limits  COMPREHENSIVE METABOLIC PANEL - Abnormal; Notable for the following:    Sodium 121 (*)    Chloride 86 (*)    Glucose, Bld 214 (*)    BUN 34 (*)    Creatinine, Ser 1.24 (*)    ALT 13 (*)    GFR calc non Af Amer 39 (*)    GFR calc Af Amer 45 (*)    All other components within normal limits  TROPONIN I - Abnormal; Notable for the following:    Troponin I 0.05 (*)    All other components within normal limits  URINALYSIS COMPLETEWITH MICROSCOPIC ([facility] ONLY) - Abnormal; Notable for the following:    Color, Urine YELLOW (*)    APPearance CLOUDY (*)    Leukocytes, UA 3+ (*)    Bacteria, UA MANY (*)    Squamous Epithelial / LPF 0-5 (*)    All other components within normal limits  URINE CULTURE   ____________________________________________  EKG  ED ECG REPORT I, Doran Stabler, the attending physician, personally viewed and interpreted this ECG.   Date: 05/30/2015  EKG Time: 953  Rate: 79  Rhythm: normal sinus rhythm  Axis: Normal axis  Intervals:LVH with repolarization abnormality.  ST&T Change: No ST segment elevation or depression. T-wave inversions in 1, aVL as well as biphasic T waves in V4 through 6.  ____________________________________________  RADIOLOGY      DG Chest 2 View (Final result) Result time: 05/30/15 10:34:45   Final result by Rad Results In Interface (05/30/15 10:34:45)    Narrative:   CLINICAL DATA: Syncope. Unresponsive.  EXAM: CHEST 2 VIEW  COMPARISON: 05/09/2015  FINDINGS: Heart is borderline in size. No confluent airspace opacities or effusions. No acute bony abnormality.  IMPRESSION: No active cardiopulmonary disease.   Electronically Signed By: Rolm Baptise M.D. On: 05/30/2015 10:34          CT Head Wo Contrast (Final result) Result time: 05/30/15 10:34:47   Final result by Rad Results In Interface (05/30/15 10:34:47)   Narrative:   CLINICAL DATA: Syncope. Dizziness, weakness  EXAM: CT HEAD WITHOUT CONTRAST  TECHNIQUE: Contiguous axial images were obtained from the base of the skull through the vertex without intravenous contrast.  COMPARISON: 07/16/2010  FINDINGS: There is no evidence of mass effect, midline shift, or extra-axial fluid collections. There is no evidence of a space-occupying lesion or intracranial hemorrhage. There is no evidence of a cortical-based area of acute infarction. There is generalized cerebral atrophy. There is periventricular white matter low attenuation likely secondary to microangiopathy.  The ventricles and sulci are appropriate for the patient's age. The basal cisterns are patent.  Visualized portions of the orbits are unremarkable. The mastoid sinuses are clear. There is mucosal thickening in the anterior ethmoid sinuses bilaterally. Cerebrovascular atherosclerotic  calcifications are noted.  The osseous structures are unremarkable.  IMPRESSION: No acute intracranial pathology.   Electronically Signed By: Kathreen Devoid On: 05/30/2015 10:34    ____________________________________________   PROCEDURES  CRITICAL CARE Performed by: Doran Stabler   Total critical care time: 35 minutes  Critical care time was exclusive of separately billable procedures and treating other patients.  Critical care was necessary to treat or prevent imminent or  life-threatening deterioration.  Critical care was time spent personally by me on the following activities: development of treatment plan with patient and/or surrogate as well as nursing, discussions with consultants, evaluation of patient's response to treatment, examination of patient, obtaining history from patient or surrogate, ordering and performing treatments and interventions, ordering and review of laboratory studies, ordering and review of radiographic studies, pulse oximetry and re-evaluation of patient's condition.  Ceruminosis is noted.  Wax is removed by syringing and manual debridement to the right ear. Instructions for home care to prevent wax buildup are given. Normal TM upon visualization.  ____________________________________________   INITIAL IMPRESSION / ASSESSMENT AND PLAN / ED COURSE  Pertinent labs & imaging results that were available during my care of the patient were reviewed by me and considered in my medical decision making (see chart for details).  ----------------------------------------- 11:32 AM on 05/30/2015 -----------------------------------------  Patient found to be hyponatremic to 121 with a concomitant UTI. Will be admitted to the hospital. Signed out to Dr. Vianne Bulls. Patient aware of need for admission to hospital. ____________________________________________   FINAL CLINICAL IMPRESSION(S) / ED DIAGNOSES  Syncope. Hyponatremia.UTI    [name], MD 05/30/15 [phone]

## 2015-05-30 NOTE — Progress Notes (Addendum)
PHARMACIST - PHYSICIAN ORDER COMMUNICATION  CONCERNING: P&T Medication Policy on Herbal Medications  DESCRIPTION:  This patient's order for:  Cranberry tab  has been noted.  This product(s) is classified as an "herbal" or natural product. Due to a lack of definitive safety studies or FDA approval, nonstandard manufacturing practices, plus the potential risk of unknown drug-drug interactions while on inpatient medications, the Pharmacy and Therapeutics Committee does not permit the use of "herbal" or natural products of this type within [facility].   ACTION TAKEN: The pharmacy department is unable to verify this order at this time. Please reevaluate patient's clinical condition at discharge and address if the herbal or natural product(s) should be resumed at that time.

## 2015-05-30 NOTE — H&P (Signed)
[place] [facility] [place] NAME: Margaret Matthews    MR#:  [id number]  DATE OF BIRTH:  1930-10-13  DATE OF ADMISSION:  05/30/2015  PRIMARY CARE PHYSICIAN: Dion Body, MD   REQUESTING/REFERRING PHYSICIAN: Dr. Dineen Kid  CHIEF COMPLAINT:   Chief Complaint  Patient presents with  . Loss of Consciousness    HISTORY OF PRESENT ILLNESS:  Margaret Matthews  is a 82 y.o. female with a known history of aortic stenosis, diabetes mellitus type 2, hypertension, stage II chronic kidney disease came in from [place] because of syncope. Patient passed out for 5 minutes according to the chart while she was eating breakfast. Did not have any headache or facial droop or weakness. Blood glucose more than 150 that time. No seizure activity reported. No recent fever. Patient sodium found to be 121, also has UTI. Patient is very hard of hearing. Complains of dizziness, generalized weakness since 2 days. Recently was here on April 2 and third secondary to acute hypoxic respiratory failure and pulmonary edema that time patient was given hydrochlorothiazide. Patient right now has no extremity edema and no hypoxia. It was discharged on April 6 diagnosis of acute diastolic heart failure, hypoxia, severe aortic stenosis, klebsiella UTI. And discharged to rehabilitation with Keflex.  PAST MEDICAL HISTORY:   Past Medical History  Diagnosis Date  . Hypertension   . Arthritis   . CKD (chronic kidney disease) stage 2, GFR 60-89 ml/min     baseline creatinine 1.5  . Renal cell carcinoma ([place])   . Breast cancer ([place])   . Anemia   . Osteopenia   . GERD (gastroesophageal reflux disease)   . PUD (peptic ulcer disease)   . Glaucoma     PAST SURGICAL HISTOIRY:   Past Surgical History  Procedure Laterality Date  . Breast surgery      Left mastectomy  . Nephrectomy      Right nephrectomy  . Hemiarthroplasty shoulder fracture      Right shoulder  . Hip surgery     Left hip  . Colonoscopy      SOCIAL HISTORY:   Social History  Substance Use Topics  . Smoking status: Former Research scientist (life sciences)  . Smokeless tobacco: Not on file  . Alcohol Use: No    FAMILY HISTORY:   Family History  Problem Relation Age of Onset  . CAD Mother   . CVA Father   . CAD Sister     DRUG ALLERGIES:   Allergies  Allergen Reactions  . Statins Other (See Comments)    Reaction: Pt says "it was showing up in her liver."     REVIEW OF SYSTEMS:  CONSTITUTIONAL: No fever, has  generalized weakness. Eyes: : No blurred or double vision.  EARS, NOSE, AND THROAT: No tinnitus or ear pain.  RESPIRATORY: No cough, shortness of breath, wheezing or hemoptysis.  CARDIOVASCULAR: No chest pain, orthopnea, edema.  GASTROINTESTINAL: No nausea, vomiting, diarrhea or abdominal pain.  GENITOURINARY: No dysuria, hematuria.  ENDOCRINE: No polyuria, nocturia,  HEMATOLOGY: No anemia, easy bruising or bleeding SKIN: No rash or lesion. MUSCULOSKELETAL: No joint pain or arthritis.   NEUROLOGIC: No tingling, numbness, weakness.  PSYCHIATRY: No anxiety or depression.   MEDICATIONS AT HOME:   Prior to Admission medications   Medication Sig Start Date End Date Taking? Authorizing Provider  acidophilus (RISAQUAD) CAPS capsule Take 1 capsule by mouth 2 (two) times daily. 07/23/14   Gladstone Lighter, MD  aspirin EC 81 MG tablet  Take 81 mg by mouth daily.    Historical Provider, MD  beta carotene w/minerals (OCUVITE) tablet Take 1 tablet by mouth every evening.    Historical Provider, MD  Calcium Carbonate-Vit D-Min (CALCIUM 600+D PLUS MINERALS PO) Take 1 tablet by mouth 2 (two) times daily.     Historical Provider, MD  carvedilol (COREG) 3.125 MG tablet Take 1 tablet (3.125 mg total) by mouth 2 (two) times daily with a meal. 05/12/15   Fritzi Mandes, MD  cephALEXin (KEFLEX) 250 MG capsule Take 1 capsule (250 mg total) by mouth every 8 (eight) hours. 05/12/15   Fritzi Mandes, MD  CRANBERRY PO Take 1 capsule  by mouth every evening.    Historical Provider, MD  dorzolamide (TRUSOPT) 2 % ophthalmic solution Place 1 drop into both eyes daily. 05/12/15   Fritzi Mandes, MD  enalapril (VASOTEC) 5 MG tablet Take 1 tablet (5 mg total) by mouth 2 (two) times daily. 07/23/14   Gladstone Lighter, MD  feeding supplement, ENSURE ENLIVE, (ENSURE ENLIVE) LIQD Take 237 mLs by mouth 3 (three) times daily between meals. 05/12/15   Fritzi Mandes, MD  hydrochlorothiazide (MICROZIDE) 12.5 MG capsule Take 25 mg by mouth daily.    Historical Provider, MD  hydroxychloroquine (PLAQUENIL) 200 MG tablet Take 200 mg by mouth daily.    Historical Provider, MD  IRON-VITAMIN C PO Take 1 tablet by mouth every evening.    Historical Provider, MD  latanoprost (XALATAN) 0.005 % ophthalmic solution Place 1 drop into both eyes at bedtime. 05/12/15   Fritzi Mandes, MD  Loperamide HCl (IMODIUM PO) Take 1 tablet by mouth daily as needed (for diarrhea).     Historical Provider, MD  mesalamine (PENTASA) 250 MG CR capsule Take 4 capsules (1,000 mg total) by mouth 2 (two) times daily. 07/23/14   Gladstone Lighter, MD  pravastatin (PRAVACHOL) 20 MG tablet Take 20 mg by mouth every evening.    Historical Provider, MD      VITAL SIGNS:  Blood pressure 181/73, pulse 85, temperature 98.1 F (36.7 C), temperature source Oral, resp. rate 22, weight 52.481 kg (115 lb 11.2 oz), SpO2 99 %.  PHYSICAL EXAMINATION:  GENERAL:  82 y.o.-year-old patient lying in the bed with no acute distress.  EYES: Pupils equal, round, reactive to light and accommodation. No scleral icterus. Extraocular muscles intact.  HEENT: Head atraumatic, normocephalic. Oropharynx and nasopharynx clear.  NECK:  Supple, no jugular venous distention. No thyroid enlargement, no tenderness.  LUNGS: Normal breath sounds bilaterally, no wheezing, rales,rhonchi or crepitation. No use of accessory muscles of respiration.  CARDIOVASCULAR: S1, S2 normal. Ejection systolic murmur present in the aortic area  with. 4/6. Abdomen: : Soft, nontender, nondistended. Bowel sounds present. No organomegaly or mass.  EXTREMITIES: No pedal edema, cyanosis, or clubbing.  NEUROLOGIC: Cranial nerves II through XII are intact. Muscle strength 5/5 in all extremities. Sensation intact. Gait not checked.  PSYCHIATRIC: The patient is alert and oriented x 3. , Very hard of hearing.  SKIN: No obvious rash, lesion, or ulcer. Decreases skin turgor.  LABORATORY PANEL:   CBC  Recent Labs Lab 05/30/15 0953  WBC 9.6  HGB 11.6*  HCT 34.0*  PLT 305   ------------------------------------------------------------------------------------------------------------------  Chemistries   Recent Labs Lab 05/30/15 0953  NA 121*  K 4.7  CL 86*  CO2 26  GLUCOSE 214*  BUN 34*  CREATININE 1.24*  CALCIUM 9.5  AST 24  ALT 13*  ALKPHOS 75  BILITOT 0.5   ------------------------------------------------------------------------------------------------------------------  Cardiac Enzymes  Recent Labs Lab 05/30/15 0953  TROPONINI 0.05*   ------------------------------------------------------------------------------------------------------------------  RADIOLOGY:  Dg Chest 2 View  05/30/2015  CLINICAL DATA:  Syncope.  Unresponsive. EXAM: CHEST  2 VIEW COMPARISON:  05/09/2015 FINDINGS: Heart is borderline in size. No confluent airspace opacities or effusions. No acute bony abnormality. IMPRESSION: No active cardiopulmonary disease. Electronically Signed   By: Rolm Baptise M.D.   On: 05/30/2015 10:34   Ct Head Wo Contrast  05/30/2015  CLINICAL DATA:  Syncope.  Dizziness, weakness EXAM: CT HEAD WITHOUT CONTRAST TECHNIQUE: Contiguous axial images were obtained from the base of the skull through the vertex without intravenous contrast. COMPARISON:  07/16/2010 FINDINGS: There is no evidence of mass effect, midline shift, or extra-axial fluid collections. There is no evidence of a space-occupying lesion or intracranial  hemorrhage. There is no evidence of a cortical-based area of acute infarction. There is generalized cerebral atrophy. There is periventricular white matter low attenuation likely secondary to microangiopathy. The ventricles and sulci are appropriate for the patient's age. The basal cisterns are patent. Visualized portions of the orbits are unremarkable. The mastoid sinuses are clear. There is mucosal thickening in the anterior ethmoid sinuses bilaterally. Cerebrovascular atherosclerotic calcifications are noted. The osseous structures are unremarkable. IMPRESSION: No acute intracranial pathology. Electronically Signed   By: Kathreen Devoid   On: 05/30/2015 10:34    EKG:   Orders placed or performed during the hospital encounter of 05/30/15  . EKG 12-Lead  . EKG 12-Lead   EKG shows sinus rhythm at 79 bpm no ST-T changes.  IMPRESSION AND PLAN:  #1 syncope secondary to hyponatremia, UTI: Admitted to hospitalist service with off unit telemetry. The of the head did not show any acute stroke. Check ultrasound of carotids to evaluate for  syncope #2. Acute hyponatremia due to dehydration and decreased serum osmolality: Continue IV hydration, recheck the sodium. Hold hydrochlorothiazide at this time. #3 recent klebsiella UTI: Patient urine cultures showed resistance to ampicillin and she already is on Keflex at nursing home. We are starting the Rocephin here, follow repeat urine cultures. #4 chronic diastolic heart failure: Compensated. Hold diuretics secondary to hyponatremia. #5 diabetes mellitus type 2: #6 severe aortic stenosis: Patient is on Coreg only. #7 generalized weakness: Physical therapy consult #8 hyperlipidemia: Continue statins. #9 chronic kidney disease stage II: Stable kidney function     All the records are reviewed and case discussed with ED provider. Management plans discussed with the patient, family and they are in agreement.  CODE STATUS:   TOTAL TIME TAKING CARE OF THIS  PATIENT: 56  minutes.    Epifanio Lesches M.D on 05/30/2015 at 11:48 AM  Between 7am to 6pm - Pager - [phone]  After 6pm go to [email or web address] - password [email or web address]  Office  [phone]  CC: Primary care physician; Dion Body, MD  Note: This dictation was prepared with Dragon dictation along with smaller phrase technology. Any transcriptional errors that result from this process are unintentional.

## 2015-05-30 NOTE — ED Notes (Signed)
Pt arrived by EMS from [place]. Staff reports "pt was eating breakfast and went unresponsive for 5 minutes. Pt did not have in hearing aids at the time" HX mastectomy on L side. PT reports no pain but feels dizzy and weak. A&O X4

## 2015-05-30 NOTE — ED Notes (Signed)
MD washed out ear at bedside

## 2015-05-31 LAB — BASIC METABOLIC PANEL
Anion gap: 7 (ref 5–15)
BUN: 37 mg/dL — AB (ref 6–20)
CHLORIDE: 93 mmol/L — AB (ref 101–111)
CO2: 25 mmol/L (ref 22–32)
Calcium: 9.1 mg/dL (ref 8.9–10.3)
Creatinine, Ser: 1.24 mg/dL — ABNORMAL HIGH (ref 0.44–1.00)
GFR calc Af Amer: 45 mL/min — ABNORMAL LOW (ref >60)
GFR calc non Af Amer: 39 mL/min — ABNORMAL LOW (ref >60)
GLUCOSE: 92 mg/dL (ref 65–99)
POTASSIUM: 4.2 mmol/L (ref 3.5–5.1)
SODIUM: 125 mmol/L — AB (ref 135–145)

## 2015-05-31 LAB — GLUCOSE, CAPILLARY: Glucose-Capillary: 87 mg/dL (ref 65–99)

## 2015-05-31 LAB — CBC
HEMATOCRIT: 30.8 % — AB (ref 35.0–47.0)
HEMOGLOBIN: 10.5 g/dL — AB (ref 12.0–16.0)
MCH: 28.6 pg (ref 26.0–34.0)
MCHC: 34.1 g/dL (ref 32.0–36.0)
MCV: 83.7 fL (ref 80.0–100.0)
Platelets: 265 10*3/uL (ref 150–440)
RBC: 3.68 MIL/uL — ABNORMAL LOW (ref 3.80–5.20)
RDW: 13.6 % (ref 11.5–14.5)
WBC: 9.6 10*3/uL (ref 3.6–11.0)

## 2015-05-31 NOTE — Evaluation (Signed)
Physical Therapy Evaluation Patient Details Name: Margaret Matthews MRN: [id number] DOB: 08/30/1930 Today's Date: 05/31/2015   History of Present Illness  Margaret Matthews is a 82 y.o. female with a known history of aortic stenosis, diabetes mellitus type 2, hypertension, stage II chronic kidney disease came in from [place] because of syncope. Patient passed out for 5 minutes according to the chart while she was eating breakfast. Did not have any headache or facial droop or weakness. Blood glucose more than 150 that time. No seizure activity reported. No recent fever. Patient sodium found to be 121, also has UTI. Patient is very hard of hearing. Complains of dizziness, generalized weakness since 2 days. Recently was here on April 2 and third secondary to acute hypoxic respiratory failure and pulmonary edema that time patient was given hydrochlorothiazide. Patient right now has no extremity edema and no hypoxia. It was discharged on April 6 diagnosis of acute diastolic heart failure, hypoxia, severe aortic stenosis, klebsiella UTI. And discharged to rehabilitation with Keflex. No falls in the last 12 months  Clinical Impression  Pt demonstrates slow but steady gait with use of rolling walker. She is slightly less steady without assistive device without overt LOB. Nevertheless she appears considerably more stable with improved gait speed and step length with use of rolling walker. Encouraged pt to use rolling walker at discharge which she will need delivered to room prior to discharge. Vitals remain WNL throughout ambulation although she does report some mild dizziness. No presyncopal symptoms. Recommend HH PT to continue with strengthening in order to improve function but pt currently refusing. Pt will benefit from skilled PT services to address deficits in strength, balance, and mobility in order to return to full function at home.     Follow Up Recommendations Home health PT;Other (comment) (Currently  refuses)    Equipment Recommendations  Rolling walker with 5" wheels;Other (comment) (Agrees to use walker at home)    Recommendations for Other Services       Precautions / Restrictions Precautions Precautions: Fall Restrictions Weight Bearing Restrictions: No      Mobility  Bed Mobility Overal bed mobility: Needs Assistance Bed Mobility: Supine to Sit     Supine to sit: Supervision     General bed mobility comments: Good speed/sequencing with bed mobility  Transfers Overall transfer level: Needs assistance Equipment used: Rolling walker (2 wheeled) Transfers: Sit to/from Stand Sit to Stand: Min guard         General transfer comment: Pt demonstrates safe hand placement with sit to stand transfers. Good stability once in standing without UE support in wide stance. No LE buckling noted.  Ambulation/Gait Ambulation/Gait assistance: Min guard Ambulation Distance (Feet): 250 Feet Assistive device: Rolling walker (2 wheeled) Gait Pattern/deviations: Step-through pattern Gait velocity: Decreased but functional for full household mobility Gait velocity interpretation: <1.8 ft/sec, indicative of risk for recurrent falls General Gait Details: Decreased gait speed but functional for household mobility. Started with rolling walker but able to ambulate short distance without rolling walker. No overt LOB but decreased steadiness noted. Pt reports mild dizziness during ambulation. Vitals monitored and remain WNL throughout distance on room air.   Stairs            Wheelchair Mobility    Modified Rankin (Stroke Patients Only)       Balance Overall balance assessment: Needs assistance Sitting-balance support: No upper extremity supported Sitting balance-Leahy Scale: Good     Standing balance support: No upper extremity supported Standing balance-Leahy Scale: Fair  Standing balance comment: Able to maintain wide stance without UE support. Unable to achieve or  maintain narrow stance without UE support. Single leg balance < 1 second on each LE                             Pertinent Vitals/Pain Pain Assessment: No/denies pain    Home Living Family/patient expects to be discharged to:: Other (Comment) (Recently at [facility]) Living Arrangements: Children;Other (Comment) (Lives with son) Available Help at Discharge: Family Type of Home: House Home Access: Stairs to enter Entrance Stairs-Rails: Left Entrance Stairs-Number of Steps: 3 in garage Home Layout: Two level Home Equipment: [place] - single point (No walker) Additional Comments: Bedroom on second floor    Prior Function Level of Independence: Independent with assistive device(s)         Comments: cane for limited community ambulation, drives     Hand Dominance   Dominant Hand: Right    Extremity/Trunk Assessment   Upper Extremity Assessment: Overall WFL for tasks assessed           Lower Extremity Assessment: Overall WFL for tasks assessed         Communication   Communication: HOH  Cognition Arousal/Alertness: Awake/alert Behavior During Therapy: WFL for tasks assessed/performed Overall Cognitive Status: Within Functional Limits for tasks assessed                      General Comments      Exercises        Assessment/Plan    PT Assessment Patient needs continued PT services  PT Diagnosis Difficulty walking;Generalized weakness   PT Problem List Decreased strength;Decreased activity tolerance;Decreased balance;Decreased mobility  PT Treatment Interventions Gait training;Stair training;DME instruction;Therapeutic activities;Therapeutic exercise;Balance training;Patient/family education;Neuromuscular re-education   PT Goals (Current goals can be found in the Care Plan section) Acute Rehab PT Goals Patient Stated Goal: Return to prior level of function at home PT Goal Formulation: With patient Time For Goal Achievement:  06/14/15 Potential to Achieve Goals: Fair    Frequency Min 2X/week   Barriers to discharge Inaccessible home environment;Decreased caregiver support Sons work during the day. Bedroom on second level of home    Co-evaluation               End of Session Equipment Utilized During Treatment: Gait belt Activity Tolerance: Patient tolerated treatment well Patient left: in bed;with call bell/phone within reach;with bed alarm set;Other (comment) (Eating lunch) Nurse Communication: Mobility status         Time: [id number] PT Time Calculation (min) (ACUTE ONLY): 28 min   Charges:   PT Evaluation $PT Eval Moderate Complexity: 1 Procedure PT Treatments $Gait Training: 8-22 mins   PT G Codes:       Lyndel Safe Huprich PT, DPT   Huprich,Jason 05/31/2015, 3:27 PM

## 2015-05-31 NOTE — Progress Notes (Signed)
[facility] [place] NAME: Margaret Matthews    MR#:  [id number]  DATE OF BIRTH:  12-27-1930  SUBJECTIVE:  CHIEF COMPLAINT:   Chief Complaint  Patient presents with  . Loss of Consciousness   Loose diarrhea without abdominal pain, nausea or vomiting today. REVIEW OF SYSTEMS:  CONSTITUTIONAL: No fever, has generalized weakness.  EYES: No blurred or double vision.  EARS, NOSE, AND THROAT: No tinnitus or ear pain.  RESPIRATORY: No cough, shortness of breath, wheezing or hemoptysis.  CARDIOVASCULAR: No chest pain, orthopnea, edema.  GASTROINTESTINAL: No nausea, vomiting, or abdominal pain.  but has diarrhea. GENITOURINARY: No dysuria, hematuria.  ENDOCRINE: No polyuria, nocturia,  HEMATOLOGY: No anemia, easy bruising or bleeding SKIN: No rash or lesion. MUSCULOSKELETAL: No joint pain or arthritis.   NEUROLOGIC: No tingling, numbness, weakness.  PSYCHIATRY: No anxiety or depression.   DRUG ALLERGIES:   Allergies  Allergen Reactions  . Statins Other (See Comments)    Reaction: Pt says "it was showing up in her liver."     VITALS:  Blood pressure 112/41, pulse 80, temperature 98 F (36.7 C), temperature source Oral, resp. rate 18, height 5\' 2"  (1.575 m), weight 49.442 kg (109 lb), SpO2 100 %.  PHYSICAL EXAMINATION:  GENERAL:  82 y.o.-year-old patient lying in the bed with no acute distress.  EYES: Pupils equal, round, reactive to light and accommodation. No scleral icterus. Extraocular muscles intact.  HEENT: Head atraumatic, normocephalic. Oropharynx and nasopharynx clear.  moist oral mucosa.  NECK:  Supple, no jugular venous distention. No thyroid enlargement, no tenderness.  LUNGS: Normal breath sounds bilaterally, no wheezing, rales,rhonchi or crepitation. No use of accessory muscles of respiration.  CARDIOVASCULAR: S1, S2 normal. No murmurs, rubs, or gallops.  ABDOMEN: Soft, nontender, nondistended. Bowel sounds present. No  organomegaly or mass.  EXTREMITIES: No pedal edema, cyanosis, or clubbing.  NEUROLOGIC: Cranial nerves II through XII are intact. Muscle strength 4/5 in all extremities. Sensation intact. Gait not checked.  PSYCHIATRIC: The patient is alert and oriented x 3.  SKIN: No obvious rash, lesion, or ulcer.    LABORATORY PANEL:   CBC  Recent Labs Lab 05/31/15 0410  WBC 9.6  HGB 10.5*  HCT 30.8*  PLT 265   ------------------------------------------------------------------------------------------------------------------  Chemistries   Recent Labs Lab 05/30/15 0953 05/31/15 0410  NA 121* 125*  K 4.7 4.2  CL 86* 93*  CO2 26 25  GLUCOSE 214* 92  BUN 34* 37*  CREATININE 1.24* 1.24*  CALCIUM 9.5 9.1  AST 24  --   ALT 13*  --   ALKPHOS 75  --   BILITOT 0.5  --    ------------------------------------------------------------------------------------------------------------------  Cardiac Enzymes  Recent Labs Lab 05/30/15 0953  TROPONINI 0.05*   ------------------------------------------------------------------------------------------------------------------  RADIOLOGY:  Dg Chest 2 View  05/30/2015  CLINICAL DATA:  Syncope.  Unresponsive. EXAM: CHEST  2 VIEW COMPARISON:  05/09/2015 FINDINGS: Heart is borderline in size. No confluent airspace opacities or effusions. No acute bony abnormality. IMPRESSION: No active cardiopulmonary disease. Electronically Signed   By: Rolm Baptise M.D.   On: 05/30/2015 10:34   Ct Head Wo Contrast  05/30/2015  CLINICAL DATA:  Syncope.  Dizziness, weakness EXAM: CT HEAD WITHOUT CONTRAST TECHNIQUE: Contiguous axial images were obtained from the base of the skull through the vertex without intravenous contrast. COMPARISON:  07/16/2010 FINDINGS: There is no evidence of mass effect, midline shift, or extra-axial fluid collections. There is no evidence of a space-occupying lesion  or intracranial hemorrhage. There is no evidence of a cortical-based area of  acute infarction. There is generalized cerebral atrophy. There is periventricular white matter low attenuation likely secondary to microangiopathy. The ventricles and sulci are appropriate for the patient's age. The basal cisterns are patent. Visualized portions of the orbits are unremarkable. The mastoid sinuses are clear. There is mucosal thickening in the anterior ethmoid sinuses bilaterally. Cerebrovascular atherosclerotic calcifications are noted. The osseous structures are unremarkable. IMPRESSION: No acute intracranial pathology. Electronically Signed   By: Kathreen Devoid   On: 05/30/2015 10:34    EKG:   Orders placed or performed during the hospital encounter of 05/30/15  . EKG 12-Lead  . EKG 12-Lead    ASSESSMENT AND PLAN:   #1 syncope secondary to dehydration,  hyponatremia and UTI.  #2. Acute hyponatremia due to dehydration  Continue normal saline, follow-up BMP. Hold hydrochlorothiazide.  * Dehydration with chronic kidney disease stage II:  Continue IV fluids to support.   #3 UTI: Patient had recent klebsiella UTI, urine cultures showed resistance to ampicillin and she already is on Keflex at nursing home.  Continue Rocephin here,  follow-up urine cultures.  #4 chronic diastolic heart failure: Compensated. Hold diuretics secondary to hyponatremia. #5 diabetes mellitus type 2: continue sliding scale.  #6 severe aortic stenosis: Patient is on Coreg only. #7 generalized weakness: Physical therapy consult #8 hyperlipidemia: Continue statins.    diarrhea. She is on Colace twice a day and Dulcolax when necessary.hold Colace.    All the records are reviewed and case discussed with Care Management/Social Workerr. Management plans discussed with the patient, family and they are in agreement.  CODE STATUS: full code   TOTAL TIME TAKING CARE OF THIS PATIENT: 41 minutes.  Greater than 50% time was spent on coordination of care and face-to-face counseling.  POSSIBLE D/C IN 3  DAYS, DEPENDING ON CLINICAL CONDITION.   Demetrios Loll M.D on 05/31/2015 at 1:15 PM  Between 7am to 6pm - Pager - [phone]  After 6pm go to [email or web address] - password [email or web address] [facility]  Office  [phone]  CC: Primary care physician; Dion Body, MD

## 2015-06-01 LAB — BASIC METABOLIC PANEL
ANION GAP: 7 (ref 5–15)
BUN: 26 mg/dL — AB (ref 6–20)
CALCIUM: 8.7 mg/dL — AB (ref 8.9–10.3)
CO2: 22 mmol/L (ref 22–32)
Chloride: 100 mmol/L — ABNORMAL LOW (ref 101–111)
Creatinine, Ser: 0.8 mg/dL (ref 0.44–1.00)
GFR calc Af Amer: >60 mL/min (ref >60)
GLUCOSE: 90 mg/dL (ref 65–99)
POTASSIUM: 4.2 mmol/L (ref 3.5–5.1)
SODIUM: 129 mmol/L — AB (ref 135–145)

## 2015-06-01 LAB — URINE CULTURE: Culture: >=100000 — AB

## 2015-06-01 LAB — GLUCOSE, CAPILLARY: GLUCOSE-CAPILLARY: 86 mg/dL (ref 65–99)

## 2015-06-01 MED ORDER — LOPERAMIDE HCL 2 MG PO CAPS
2.0000 mg | ORAL_CAPSULE | Freq: Four times a day (QID) | ORAL | Status: DC | PRN
  Administered 2015-06-01 – 2015-06-03 (×7): 2 mg via ORAL
  Filled 2015-06-01 (×7): qty 1

## 2015-06-01 MED ORDER — OFLOXACIN 0.3 % OP SOLN
4.0000 [drp] | Freq: Two times a day (BID) | OPHTHALMIC | Status: DC
  Administered 2015-06-02 – 2015-06-03 (×4): 4 [drp] via OTIC
  Filled 2015-06-01 (×2): qty 5

## 2015-06-01 NOTE — Progress Notes (Signed)
Physical Therapy Treatment Patient Details Name: Margaret Matthews MRN: [id number] DOB: Nov 24, 1930 Today's Date: 06/01/2015    History of Present Illness Margaret Matthews is a 82 y.o. female with a known history of aortic stenosis, diabetes mellitus type 2, hypertension, stage II chronic kidney disease came in from [place] because of syncope. Patient passed out for 5 minutes according to the chart while she was eating breakfast. Did not have any headache or facial droop or weakness. Blood glucose more than 150 that time. No seizure activity reported. No recent fever. Patient sodium found to be 121, also has UTI. Patient is very hard of hearing. Complains of dizziness, generalized weakness since 2 days. Recently was here on April 2 and third secondary to acute hypoxic respiratory failure and pulmonary edema that time patient was given hydrochlorothiazide. Patient right now has no extremity edema and no hypoxia. It was discharged on April 6 diagnosis of acute diastolic heart failure, hypoxia, severe aortic stenosis, klebsiella UTI. And discharged to rehabilitation with Keflex. No falls in the last 12 months    PT Comments    Pt demonstrates excellent progress with physical therapy. She is able to easily complete a full lap around RN station without DOE. Vitals remain WNL. Able to complete all seated exercises as instructed. Pt will benefit from skilled PT services to address deficits in strength, balance, and mobility in order to return to full function at home.   Follow Up Recommendations  Home health PT;Other (comment) (Currently refuses)     Equipment Recommendations  Rolling walker with 5" wheels;Other (comment) (Agrees to use walker at home)    Recommendations for Other Services       Precautions / Restrictions Precautions Precautions: Fall Restrictions Weight Bearing Restrictions: No    Mobility  Bed Mobility Overal bed mobility: Needs Assistance Bed Mobility: Supine to Sit      Supine to sit: Supervision     General bed mobility comments: Good speed/sequencing with bed mobility  Transfers Overall transfer level: Needs assistance Equipment used: Rolling walker (2 wheeled) Transfers: Sit to/from Stand Sit to Stand: Supervision         General transfer comment: Pt demonstrates safe hand placement with sit to stand transfers. Good stability once in standing without UE support in wide stance. No LE buckling noted.  Ambulation/Gait Ambulation/Gait assistance: Min guard Ambulation Distance (Feet): 250 Feet Assistive device: Rolling walker (2 wheeled) Gait Pattern/deviations: Step-through pattern Gait velocity: Decreased but functional for full household mobility Gait velocity interpretation: <1.8 ft/sec, indicative of risk for recurrent falls General Gait Details: Pt denies fatigue or DOE during ambulation. Vitals intermittently monitored and remain WNL throughout distance. Good stability with rolling walker. Able to maintain conversation throughout walking distance   Stairs            Wheelchair Mobility    Modified Rankin (Stroke Patients Only)       Balance Overall balance assessment: Needs assistance Sitting-balance support: No upper extremity supported Sitting balance-Leahy Scale: Good Sitting balance - Comments: maintains midline and able to weight shift   Standing balance support: No upper extremity supported Standing balance-Leahy Scale: Fair                      Cognition Arousal/Alertness: Awake/alert Behavior During Therapy: WFL for tasks assessed/performed Overall Cognitive Status: Within Functional Limits for tasks assessed                      Exercises General Exercises -  Lower Extremity Long Arc Quad: Strengthening;Both;10 reps;Seated Heel Slides: Strengthening;Both;10 reps;Seated Hip ABduction/ADduction: Strengthening;Both;10 reps;Seated Hip Flexion/Marching: Strengthening;Both;10 reps;Seated Heel  Raises: Strengthening;Both;10 reps;Seated    General Comments        Pertinent Vitals/Pain Pain Assessment: No/denies pain    Home Living                      Prior Function            PT Goals (current goals can now be found in the care plan section) Acute Rehab PT Goals Patient Stated Goal: Return to prior level of function at home PT Goal Formulation: With patient Time For Goal Achievement: 06/14/15 Potential to Achieve Goals: Fair Progress towards PT goals: Progressing toward goals    Frequency  Min 2X/week    PT Plan Current plan remains appropriate    Co-evaluation             End of Session Equipment Utilized During Treatment: Gait belt Activity Tolerance: Patient tolerated treatment well Patient left: in bed;with call bell/phone within reach;with bed alarm set;Other (comment) (Eating dinner sitting at EOB)     Time: [id number] PT Time Calculation (min) (ACUTE ONLY): 9 min  Charges:  $Therapeutic Exercise: 8-22 mins                    G Codes:      Lyndel Safe Huprich PT, DPT   Huprich,Jason 06/01/2015, 5:15 PM

## 2015-06-01 NOTE — Care Management Important Message (Signed)
Important Message  Patient Details  Name: Margaret Matthews MRN: [id number] Date of Birth: 03/03/1930   Medicare Important Message Given:  Yes    Juliann Pulse A Chrissi Crow 06/01/2015, 10:48 AM

## 2015-06-01 NOTE — Care Management (Signed)
Presented to this hospital with the diagnosis of hyponatremia from [facility]. Discharged from this facility to [facility] 05/12/15. Sons lives her in the home. Margaret Matthews telephone # is [phone]. Uses a cane in the home. Sees Dr. Netty Starring as primary care physician. No Life Alert. Takes care of all basic needs in the home. No falls. Good appetite. States that her sons are with her most of the time, but not all the time.  Physical therapy evaluation completed yesterday. Ambulated 250 feet. Home with home health and therapy in the home recommended. Declines home health in the home at this time. Would like a rolling walker.  IV Rocephin continues. States she is still having diarrhea. [name] RN MSN CCM Care Management [phone]

## 2015-06-01 NOTE — Consult Note (Signed)
Margaret Matthews, Margaret Matthews [id number] Oct 16, 1930 Riley Nearing, MD  Reason for Consult: Bleeding from right ear Requesting Physician: Demetrios Loll, MD Consulting Physician: Riley Nearing, MD  HPI: This 82 y.o. year old female was admitted on 05/30/2015 for Hyponatremia [E87.1] UTI (lower urinary tract infection) [N39.0] Syncope, unspecified syncope type [R55]. The patient reports bleeding from her right ear today after attempting to put in her hearing aid. She says that someone had washed her ear out the day she was admitted, and the ear has been a little sore. She has a history of bilateral sensorineural hearing loss and wears bilateral hearing aids. She has not had previous issues with bleeding. Notably prior to the start of the bleeding she had been placed on heparin. She is not in any significant ear pain currently.  Medications:  Current Facility-Administered Medications  Medication Dose Route Frequency Provider Last Rate Last Dose  . 0.9 %  sodium chloride infusion   Intravenous Continuous Epifanio Lesches, MD 75 mL/hr at 06/01/15 0627    . acetaminophen (TYLENOL) tablet 650 mg  650 mg Oral Q6H PRN Epifanio Lesches, MD       Or  . acetaminophen (TYLENOL) suppository 650 mg  650 mg Rectal Q6H PRN Epifanio Lesches, MD      . acidophilus (RISAQUAD) capsule 1 capsule  1 capsule Oral BID Epifanio Lesches, MD   1 capsule at 06/01/15 1047  . aspirin EC tablet 81 mg  81 mg Oral Daily Epifanio Lesches, MD   81 mg at 06/01/15 1045  . bisacodyl (DULCOLAX) EC tablet 5 mg  5 mg Oral Daily PRN Epifanio Lesches, MD      . calcium-vitamin D (OSCAL WITH D) 500-200 MG-UNIT per tablet 1 tablet  1 tablet Oral BID Epifanio Lesches, MD   1 tablet at 06/01/15 1047  . carvedilol (COREG) tablet 3.125 mg  3.125 mg Oral BID WC Epifanio Lesches, MD   3.125 mg at 06/01/15 1656  . cefTRIAXone (ROCEPHIN) 1 g in dextrose 5 % 50 mL IVPB  1 g Intravenous Q24H Epifanio Lesches, MD   1 g at 06/01/15 1047   . dorzolamide (TRUSOPT) 2 % ophthalmic solution 1 drop  1 drop Both Eyes Daily Epifanio Lesches, MD   1 drop at 06/01/15 1047  . enalapril (VASOTEC) tablet 5 mg  5 mg Oral BID Epifanio Lesches, MD   5 mg at 06/01/15 1045  . feeding supplement (ENSURE ENLIVE) (ENSURE ENLIVE) liquid 237 mL  237 mL Oral TID BM Epifanio Lesches, MD   237 mL at 06/01/15 1049  . ferrous sulfate tablet 325 mg  325 mg Oral QPM Epifanio Lesches, MD   325 mg at 05/31/15 1758   And  . vitamin C (ASCORBIC ACID) tablet 250 mg  250 mg Oral QPM Epifanio Lesches, MD   250 mg at 05/31/15 1758  . HYDROcodone-acetaminophen (NORCO/VICODIN) 5-325 MG per tablet 1-2 tablet  1-2 tablet Oral Q4H PRN Epifanio Lesches, MD      . hydroxychloroquine (PLAQUENIL) tablet 200 mg  200 mg Oral Daily Epifanio Lesches, MD   200 mg at 06/01/15 1125  . latanoprost (XALATAN) 0.005 % ophthalmic solution 1 drop  1 drop Both Eyes QHS Epifanio Lesches, MD   1 drop at 05/31/15 2201  . loperamide (IMODIUM) capsule 2 mg  2 mg Oral Q6H PRN Demetrios Loll, MD   2 mg at 06/01/15 0749  . mesalamine (PENTASA) CR capsule 1,000 mg  1,000 mg Oral BID Epifanio Lesches, MD   1,000  mg at 06/01/15 1046  . multivitamin (PROSIGHT) tablet 1 tablet  1 tablet Oral QPM Epifanio Lesches, MD   1 tablet at 05/31/15 1758  . ondansetron (ZOFRAN) tablet 4 mg  4 mg Oral Q6H PRN Epifanio Lesches, MD       Or  . ondansetron (ZOFRAN) injection 4 mg  4 mg Intravenous Q6H PRN Epifanio Lesches, MD      . pravastatin (PRAVACHOL) tablet 20 mg  20 mg Oral QPM Epifanio Lesches, MD   20 mg at 05/31/15 1758  . traZODone (DESYREL) tablet 25 mg  25 mg Oral QHS PRN Epifanio Lesches, MD      .  Medications Prior to Admission  Medication Sig Dispense Refill  . acidophilus (RISAQUAD) CAPS capsule Take 1 capsule by mouth 2 (two) times daily. 15 capsule 0  . amLODipine (NORVASC) 5 MG tablet Take 1 tablet by mouth daily.    Marland Kitchen aspirin EC 81 MG tablet Take 81 mg by  mouth daily.    . beta carotene w/minerals (OCUVITE) tablet Take 1 tablet by mouth every evening.    . budesonide (ENTOCORT EC) 3 MG 24 hr capsule Take 3 mg by mouth daily. 3 dail2 weeks 2daily after    . Calcium Carbonate-Vit D-Min (CALCIUM 600+D PLUS MINERALS PO) Take 1 tablet by mouth 2 (two) times daily.     . carvedilol (COREG) 3.125 MG tablet Take 1 tablet (3.125 mg total) by mouth 2 (two) times daily with a meal. (Patient taking differently: Take 6.25 mg by mouth 2 (two) times daily with a meal. ) 60 tablet 2  . CRANBERRY PO Take 1 capsule by mouth every evening.    . dorzolamide (TRUSOPT) 2 % ophthalmic solution Place 1 drop into both eyes daily. (Patient taking differently: Place 1 drop into both eyes 2 (two) times daily. ) 10 mL 12  . enalapril (VASOTEC) 20 MG tablet Take 40 mg by mouth daily.  1  . enalapril (VASOTEC) 5 MG tablet Take 1 tablet (5 mg total) by mouth 2 (two) times daily. (Patient taking differently: Take 20 mg by mouth 2 (two) times daily. ) 60 tablet 0  . hydroxychloroquine (PLAQUENIL) 200 MG tablet Take 200 mg by mouth daily.    Marland Kitchen IRON-VITAMIN C PO Take 1 tablet by mouth every evening.    . latanoprost (XALATAN) 0.005 % ophthalmic solution Place 1 drop into both eyes at bedtime. 2.5 mL 12  . Loperamide HCl (IMODIUM PO) Take 1 tablet by mouth daily as needed (for diarrhea).     . pantoprazole (PROTONIX) 40 MG tablet Take 1 tablet by mouth daily.    . pravastatin (PRAVACHOL) 20 MG tablet Take 20 mg by mouth every evening.      Allergies:  Allergies  Allergen Reactions  . Statins Other (See Comments)    Reaction: Pt says "it was showing up in her liver."     PMH:  Past Medical History  Diagnosis Date  . Hypertension   . Arthritis   . CKD (chronic kidney disease) stage 2, GFR 60-89 ml/min     baseline creatinine 1.5  . Renal cell carcinoma ([place])   . Breast cancer ([place])   . Anemia   . Osteopenia   . GERD (gastroesophageal reflux disease)   . PUD (peptic  ulcer disease)   . Glaucoma     Fam Hx:  Family History  Problem Relation Age of Onset  . CAD Mother   . CVA Father   . CAD  Sister     Soc Hx:  Social History   Social History  . Marital Status: Widowed    Spouse Name: N/A  . Number of Children: N/A  . Years of Education: N/A   Occupational History  . Not on file.   Social History Main Topics  . Smoking status: Former Research scientist (life sciences)  . Smokeless tobacco: Not on file  . Alcohol Use: No  . Drug Use: No  . Sexual Activity: No   Other Topics Concern  . Not on file   Social History Narrative    PSH:  Past Surgical History  Procedure Laterality Date  . Breast surgery      Left mastectomy  . Nephrectomy      Right nephrectomy  . Hemiarthroplasty shoulder fracture      Right shoulder  . Hip surgery      Left hip  . Colonoscopy    . Procedures since admission: No admission procedures for hospital encounter.  ROS: Review of systems normal other than 12 systems except per HPI.  PHYSICAL EXAM  Vitals: Blood pressure 150/61, pulse 77, temperature 98.3 F (36.8 C), temperature source Oral, resp. rate 18, height _0  (1.575 m), weight 49.578 kg (109 lb 4.8 oz), SpO2 98 %.. General: Well-developed, Well-nourished in no acute distress Mood: Mood and affect well adjusted, pleasant and cooperative. Orientation: Grossly alert and oriented. Vocal Quality: No hoarseness. Communicates verbally. head and Face: NCAT. No facial asymmetry. No visible skin lesions. No significant facial scars. No tenderness with sinus percussion. Facial strength normal and symmetric. Ears: External ears with normal landmarks, no lesions. There is fresh blood in the right external auditory canal without any significant active bleeding area the skin appears to be slightly abraded proximally and inferiorly but there is no evidence of infection. Unfortunately the tympanic membrane cannot be visualized due to the accumulation of blood in the canal. The left ear  canal and tympanic membrane are unremarkable with normal TM, no middle ear effusion. Hearing: Speech reception slightly decreased, she hears with assistance of a hearing aid. Nose: External nose normal with midline dorsum and no lesions or deformity. Nasal Cavity reveals essentially midline septum with normal inferior turbinates. No significant mucosal congestion or erythema. Nasal secretions are minimal and clear. No polyps seen on anterior rhinoscopy. Oral Cavity/ Oropharynx: Lips are normal with no lesions. Teeth no frank dental caries. Gingiva healthy with no lesions or gingivitis. Oropharynx including tongue, buccal mucosa, floor of mouth, hard and soft palate, uvula and posterior pharynx free of exudates, erythema or lesions with normal symmetry and hydration.  Indirect Laryngoscopy/Nasopharyngoscopy: Visualization of the larynx, hypopharynx and nasopharynx is not possible in this setting with routine examination. Neck: Supple and symmetric with no palpable masses, tenderness or crepitance. The trachea is midline. Thyroid gland is soft, nontender and symmetric with no masses or enlargement. Parotid and submandibular glands are soft, nontender and symmetric, without masses. Lymphatic: Cervical lymph nodes are without palpable lymphadenopathy or tenderness. Respiratory: Normal respiratory effort without labored breathing. Cardiovascular: Carotid pulse shows regular rate and rhythm Neurologic: Cranial Nerves II through XII are grossly intact. Eyes: Gaze and Ocular Motility are grossly normal. PERRLA. No visible nystagmus.  MEDICAL DECISION MAKING: Data Review:  Results for orders placed or performed during the hospital encounter of 05/30/15 (from the past 48 hour(s))  Basic metabolic panel     Status: Abnormal   Collection Time: 05/31/15  4:10 AM  Result Value Ref Range   Sodium 125 (L) 135 -  145 mmol/L   Potassium 4.2 3.5 - 5.1 mmol/L   Chloride 93 (L) 101 - 111 mmol/L   CO2 25 22 - 32 mmol/L    Glucose, Bld 92 65 - 99 mg/dL   BUN 37 (H) 6 - 20 mg/dL   Creatinine, Ser 1.24 (H) 0.44 - 1.00 mg/dL   Calcium 9.1 8.9 - 10.3 mg/dL   GFR calc non Af Amer 39 (L) >60 mL/min   GFR calc Af Amer 45 (L) >60 mL/min    Comment: (NOTE) The eGFR has been calculated using the CKD EPI equation. This calculation has not been validated in all clinical situations. eGFR's persistently <60 mL/min signify possible Chronic Kidney Disease.    Anion gap 7 5 - 15  CBC     Status: Abnormal   Collection Time: 05/31/15  4:10 AM  Result Value Ref Range   WBC 9.6 3.6 - 11.0 K/uL   RBC 3.68 (L) 3.80 - 5.20 MIL/uL   Hemoglobin 10.5 (L) 12.0 - 16.0 g/dL   HCT 30.8 (L) 35.0 - 47.0 %   MCV 83.7 80.0 - 100.0 fL   MCH 28.6 26.0 - 34.0 pg   MCHC 34.1 32.0 - 36.0 g/dL   RDW 13.6 11.5 - 14.5 %   Platelets 265 150 - 440 K/uL  Glucose, capillary     Status: None   Collection Time: 05/31/15  8:40 AM  Result Value Ref Range   Glucose-Capillary 87 65 - 99 mg/dL  Glucose, capillary     Status: None   Collection Time: 06/01/15  7:17 AM  Result Value Ref Range   Glucose-Capillary 86 65 - 99 mg/dL  Basic metabolic panel     Status: Abnormal   Collection Time: 06/01/15  7:31 AM  Result Value Ref Range   Sodium 129 (L) 135 - 145 mmol/L   Potassium 4.2 3.5 - 5.1 mmol/L   Chloride 100 (L) 101 - 111 mmol/L   CO2 22 22 - 32 mmol/L   Glucose, Bld 90 65 - 99 mg/dL   BUN 26 (H) 6 - 20 mg/dL   Creatinine, Ser 0.80 0.44 - 1.00 mg/dL   Calcium 8.7 (L) 8.9 - 10.3 mg/dL   GFR calc non Af Amer >60 >60 mL/min   GFR calc Af Amer >60 >60 mL/min    Comment: (NOTE) The eGFR has been calculated using the CKD EPI equation. This calculation has not been validated in all clinical situations. eGFR's persistently <60 mL/min signify possible Chronic Kidney Disease.    Anion gap 7 5 - 15  . No results found..   ASSESSMENT: Bleeding from the right external auditory canal, most likely due to minor trauma to the skin. Visualization  of the tympanic membrane is limited in this setting.   PLAN: Recommend Floxin 4 drops to the right ear twice daily for 10 days. This will help settle any inflammation and clear the blood clot from the canal. I would be happy to see her back for follow-up at my office as an outpatient after discharge. This would allow any necessary cleaning of the ear and reassessment to make sure there is no evidence of underlying problem such as a tympanic membrane perforation.   Riley Nearing, MD 06/01/2015 5:59 PM

## 2015-06-01 NOTE — Progress Notes (Signed)
[facility] [place] NAME: Margaret Matthews    MR#:  [id number]  DATE OF BIRTH:  08/07/1930  SUBJECTIVE:  CHIEF COMPLAINT:   Chief Complaint  Patient presents with  . Loss of Consciousness   Better diarrhea, no abdominal pain, nausea or vomiting.  REVIEW OF SYSTEMS:  CONSTITUTIONAL: No fever, has generalized weakness.  EYES: No blurred or double vision.  EARS, NOSE, AND THROAT: No tinnitus or ear pain.  RESPIRATORY: No cough, shortness of breath, wheezing or hemoptysis.  CARDIOVASCULAR: No chest pain, orthopnea, edema.  GASTROINTESTINAL: No nausea, vomiting, or abdominal pain.  but has diarrhea. GENITOURINARY: No dysuria, hematuria.  ENDOCRINE: No polyuria, nocturia,  HEMATOLOGY: No anemia, easy bruising or bleeding SKIN: No rash or lesion. MUSCULOSKELETAL: No joint pain or arthritis.   NEUROLOGIC: No tingling, numbness, weakness.  PSYCHIATRY: No anxiety or depression.   DRUG ALLERGIES:   Allergies  Allergen Reactions  . Statins Other (See Comments)    Reaction: Pt says "it was showing up in her liver."     VITALS:  Blood pressure 112/52, pulse 77, temperature 98.3 F (36.8 C), temperature source Oral, resp. rate 18, height 5\' 2"  (1.575 m), weight 49.578 kg (109 lb 4.8 oz), SpO2 98 %.  PHYSICAL EXAMINATION:  GENERAL:  82 y.o.-year-old patient lying in the bed with no acute distress.  EYES: Pupils equal, round, reactive to light and accommodation. No scleral icterus. Extraocular muscles intact.  HEENT: Head atraumatic, normocephalic. Oropharynx and nasopharynx clear.  moist oral mucosa.  NECK:  Supple, no jugular venous distention. No thyroid enlargement, no tenderness.  LUNGS: Normal breath sounds bilaterally, no wheezing, rales,rhonchi or crepitation. No use of accessory muscles of respiration.  CARDIOVASCULAR: S1, S2 normal. No murmurs, rubs, or gallops.  ABDOMEN: Soft, nontender, nondistended. Bowel sounds present. No  organomegaly or mass.  EXTREMITIES: No pedal edema, cyanosis, or clubbing.  NEUROLOGIC: Cranial nerves II through XII are intact. Muscle strength 4/5 in all extremities. Sensation intact. Gait not checked.  PSYCHIATRIC: The patient is alert and oriented x 3.  SKIN: No obvious rash, lesion, or ulcer.    LABORATORY PANEL:   CBC  Recent Labs Lab 05/31/15 0410  WBC 9.6  HGB 10.5*  HCT 30.8*  PLT 265   ------------------------------------------------------------------------------------------------------------------  Chemistries   Recent Labs Lab 05/30/15 0953  06/01/15 0731  NA 121*  < > 129*  K 4.7  < > 4.2  CL 86*  < > 100*  CO2 26  < > 22  GLUCOSE 214*  < > 90  BUN 34*  < > 26*  CREATININE 1.24*  < > 0.80  CALCIUM 9.5  < > 8.7*  AST 24  --   --   ALT 13*  --   --   ALKPHOS 75  --   --   BILITOT 0.5  --   --   < > = values in this interval not displayed. ------------------------------------------------------------------------------------------------------------------  Cardiac Enzymes  Recent Labs Lab 05/30/15 0953  TROPONINI 0.05*   ------------------------------------------------------------------------------------------------------------------  RADIOLOGY:  No results found.  EKG:   Orders placed or performed during the hospital encounter of 05/30/15  . EKG 12-Lead  . EKG 12-Lead    ASSESSMENT AND PLAN:   #1 syncope secondary to dehydration,  hyponatremia and UTI.  #2. Acute hyponatremia due to dehydration  Continue normal saline, follow-up BMP. Hold hydrochlorothiazide.  * Dehydration with chronic kidney disease stage II:  Continue IV fluids to support. Improving.  #  3 UTI: Patient had recent klebsiella UTI, urine cultures showed resistance to ampicillin and she already is on Keflex at nursing home.  Continue Rocephin. Klebsiella, sensitive to rocephin per urine cultures.  #4 chronic diastolic heart failure: Compensated. Hold diuretics secondary  to hyponatremia. #5 diabetes mellitus type 2: continue sliding scale.  #6 severe aortic stenosis: Patient is on Coreg only. #7 generalized weakness: Physical therapy consult #8 hyperlipidemia: Continue statins.    diarrhea. She was on Colace twice a day and Dulcolax when necessary. stopped Colace.  C diff negative. Imodium prn.  She refused HHPT.  All the records are reviewed and case discussed with Care Management/Social Workerr. Management plans discussed with the patient, family and they are in agreement.  CODE STATUS: full code   TOTAL TIME TAKING CARE OF THIS PATIENT: 37 minutes.  Greater than 50% time was spent on coordination of care and face-to-face counseling.  POSSIBLE D/C IN 2 DAYS, DEPENDING ON CLINICAL CONDITION.   Demetrios Loll M.D on 06/01/2015 at 1:24 PM  Between 7am to 6pm - Pager - [phone]  After 6pm go to [email or web address] - password [email or web address] [facility]  Office  [phone]  CC: Primary care physician; Dion Body, MD

## 2015-06-01 NOTE — Progress Notes (Addendum)
Notified Dr. Bridgett Larsson that patient is experiencing bleeding from her right ear. Also notified Dr. Bridgett Larsson that pt had diarrhea throughout the night.

## 2015-06-01 NOTE — Progress Notes (Signed)
Notified Dr. Bridgett Larsson that patient still has ear that is bleeding. Per Dr. Bridgett Larsson discontinue order for heparin and place order for ENT consult. Will not adminster 2pm dose of heparin. Will continue to monitor.

## 2015-06-02 LAB — CBC
HEMATOCRIT: 26 % — AB (ref 35.0–47.0)
Hemoglobin: 8.8 g/dL — ABNORMAL LOW (ref 12.0–16.0)
MCH: 29.1 pg (ref 26.0–34.0)
MCHC: 33.8 g/dL (ref 32.0–36.0)
MCV: 86 fL (ref 80.0–100.0)
PLATELETS: 190 10*3/uL (ref 150–440)
RBC: 3.03 MIL/uL — ABNORMAL LOW (ref 3.80–5.20)
RDW: 14.2 % (ref 11.5–14.5)
WBC: 6.6 10*3/uL (ref 3.6–11.0)

## 2015-06-02 LAB — BASIC METABOLIC PANEL
Anion gap: 9 (ref 5–15)
BUN: 23 mg/dL — AB (ref 6–20)
CO2: 20 mmol/L — ABNORMAL LOW (ref 22–32)
CREATININE: 0.82 mg/dL (ref 0.44–1.00)
Calcium: 8.7 mg/dL — ABNORMAL LOW (ref 8.9–10.3)
Chloride: 104 mmol/L (ref 101–111)
GFR calc Af Amer: >60 mL/min (ref >60)
GLUCOSE: 80 mg/dL (ref 65–99)
POTASSIUM: 4.2 mmol/L (ref 3.5–5.1)
SODIUM: 133 mmol/L — AB (ref 135–145)

## 2015-06-02 LAB — MAGNESIUM: Magnesium: 1.6 mg/dL — ABNORMAL LOW (ref 1.7–2.4)

## 2015-06-02 LAB — GLUCOSE, CAPILLARY: Glucose-Capillary: 79 mg/dL (ref 65–99)

## 2015-06-02 MED ORDER — CEPHALEXIN 250 MG PO CAPS: 500.0000 mg | ORAL_CAPSULE | Freq: Three times a day (TID) | ORAL | Status: DC

## 2015-06-02 MED ORDER — HEPARIN SODIUM (PORCINE) 5000 UNIT/ML IJ SOLN
5000.0000 [IU] | Freq: Three times a day (TID) | INTRAMUSCULAR | Status: DC
  Administered 2015-06-02 – 2015-06-03 (×3): 5000 [IU] via SUBCUTANEOUS
  Filled 2015-06-02 (×3): qty 1

## 2015-06-02 MED ORDER — MAGNESIUM SULFATE 2 GM/50ML IV SOLN
2.0000 g | Freq: Once | INTRAVENOUS | Status: AC
  Administered 2015-06-02: 2 g via INTRAVENOUS
  Filled 2015-06-02: qty 50

## 2015-06-02 MED ORDER — CEPHALEXIN 250 MG PO CAPS
500.0000 mg | ORAL_CAPSULE | Freq: Two times a day (BID) | ORAL | Status: DC
  Administered 2015-06-03: 500 mg via ORAL
  Filled 2015-06-02: qty 2

## 2015-06-02 NOTE — Progress Notes (Signed)
Pharmacy Antibiotic Note  Margaret Matthews is a 82 y.o. female admitted on 05/30/2015 with UTI.  Patient with current orders for cephalexin 500 mg po TID  Plan: The dose of cephalexin will be adjusted to 500 mg po BID based on renal function.  Height: 5\' 2"  (157.5 cm) Weight: 112 lb 2 oz (50.86 kg) IBW/kg (Calculated) : 50.1  Temp (24hrs), Avg:97.9 F (36.6 C), Min:97.7 F (36.5 C), Max:98.1 F (36.7 C)   Recent Labs Lab 05/30/15 0953 05/31/15 0410 06/01/15 0731 06/02/15 0427  WBC 9.6 9.6  --  6.6  CREATININE 1.24* 1.24* 0.80 0.82    Estimated Creatinine Clearance: 41.1 mL/min (by C-G formula based on Cr of 0.82).    Allergies  Allergen Reactions  . Statins Other (See Comments)    Reaction: Pt says "it was showing up in her liver."     Antimicrobials this admission: Anti-infectives    Start     Dose/Rate Route Frequency Ordered Stop   06/03/15 1000  cephALEXin (KEFLEX) capsule 500 mg     500 mg Oral 2 times daily 06/02/15 1413     06/02/15 1600  cephALEXin (KEFLEX) capsule 500 mg  Status:  Discontinued     500 mg Oral 3 times daily 06/02/15 1251 06/02/15 1413   05/31/15 1000  cefTRIAXone (ROCEPHIN) 1 g in dextrose 5 % 50 mL IVPB  Status:  Discontinued     1 g 100 mL/hr over 30 Minutes Intravenous Every 24 hours 05/30/15 1135 06/02/15 1251   05/30/15 1145  hydroxychloroquine (PLAQUENIL) tablet 200 mg     200 mg Oral Daily 05/30/15 1135     05/30/15 1115  cefTRIAXone (ROCEPHIN) 1 g in dextrose 5 % 50 mL IVPB     1 g 100 mL/hr over 30 Minutes Intravenous  Once 05/30/15 1104 05/30/15 1212      Microbiology results: Results for orders placed or performed during the hospital encounter of 05/30/15  Urine culture     Status: Abnormal   Collection Time: 05/30/15  9:53 AM  Result Value Ref Range Status   Specimen Description URINE, RANDOM  Final   Special Requests NONE  Final   Culture >=100,000 COLONIES/mL KLEBSIELLA PNEUMONIAE (A)  Final   Report Status 06/01/2015  FINAL  Final   Organism ID, Bacteria KLEBSIELLA PNEUMONIAE (A)  Final      Susceptibility   Klebsiella pneumoniae - MIC*    AMPICILLIN 16 RESISTANT Resistant     CEFAZOLIN <=4 SENSITIVE Sensitive     CEFTRIAXONE <=1 SENSITIVE Sensitive     CIPROFLOXACIN <=0.25 SENSITIVE Sensitive     GENTAMICIN <=1 SENSITIVE Sensitive     IMIPENEM <=0.25 SENSITIVE Sensitive     NITROFURANTOIN 64 INTERMEDIATE Intermediate     TRIMETH/SULFA <=20 SENSITIVE Sensitive     AMPICILLIN/SULBACTAM 4 SENSITIVE Sensitive     PIP/TAZO <=4 SENSITIVE Sensitive     Extended ESBL NEGATIVE Sensitive     * >=100,000 COLONIES/mL KLEBSIELLA PNEUMONIAE  MRSA PCR Screening     Status: None   Collection Time: 05/30/15  3:05 PM  Result Value Ref Range Status   MRSA by PCR NEGATIVE NEGATIVE Final    Comment:        The GeneXpert MRSA Assay (FDA approved for NASAL specimens only), is one component of a comprehensive MRSA colonization surveillance program. It is not intended to diagnose MRSA infection nor to guide or monitor treatment for MRSA infections.   C difficile quick scan w PCR reflex  Status: None   Collection Time: 05/30/15  3:14 PM  Result Value Ref Range Status   C Diff antigen NEGATIVE NEGATIVE Final   C Diff toxin NEGATIVE NEGATIVE Final   C Diff interpretation Negative for C. difficile  Final    Thank you for allowing pharmacy to be a part of this patient's care.  Zyrell Carmean G 06/02/2015 2:13 PM

## 2015-06-02 NOTE — Progress Notes (Signed)
[facility] [place] NAME: Margaret Matthews    MR#:  [id number]  DATE OF BIRTH:  Apr 29, 1930  SUBJECTIVE:  CHIEF COMPLAINT:   Chief Complaint  Patient presents with  . Loss of Consciousness   Better diarrhea with imodium prn, no abdominal pain, nausea or vomiting.  REVIEW OF SYSTEMS:  CONSTITUTIONAL: No fever, has generalized weakness.  EYES: No blurred or double vision.  EARS, NOSE, AND THROAT: No tinnitus or ear pain.  RESPIRATORY: No cough, shortness of breath, wheezing or hemoptysis.  CARDIOVASCULAR: No chest pain, orthopnea, edema.  GASTROINTESTINAL: No nausea, vomiting, or abdominal pain.  but has diarrhea. GENITOURINARY: No dysuria, hematuria.  ENDOCRINE: No polyuria, nocturia,  HEMATOLOGY: No anemia, easy bruising or bleeding SKIN: No rash or lesion. MUSCULOSKELETAL: No joint pain or arthritis.   NEUROLOGIC: No tingling, numbness, weakness.  PSYCHIATRY: No anxiety or depression.   DRUG ALLERGIES:   Allergies  Allergen Reactions  . Statins Other (See Comments)    Reaction: Pt says "it was showing up in her liver."     VITALS:  Blood pressure 151/54, pulse 71, temperature 97.7 F (36.5 C), temperature source Oral, resp. rate 16, height 5\' 2"  (1.575 m), weight 50.86 kg (112 lb 2 oz), SpO2 98 %.  PHYSICAL EXAMINATION:  GENERAL:  82 y.o.-year-old patient lying in the bed with no acute distress.  EYES: Pupils equal, round, reactive to light and accommodation. No scleral icterus. Extraocular muscles intact.  HEENT: Head atraumatic, normocephalic. Oropharynx and nasopharynx clear.  moist oral mucosa.  NECK:  Supple, no jugular venous distention. No thyroid enlargement, no tenderness.  LUNGS: Normal breath sounds bilaterally, no wheezing, rales,rhonchi or crepitation. No use of accessory muscles of respiration.  CARDIOVASCULAR: S1, S2 normal. No murmurs, rubs, or gallops.  ABDOMEN: Soft, nontender, nondistended. Bowel sounds  present. No organomegaly or mass.  EXTREMITIES: No pedal edema, cyanosis, or clubbing.  NEUROLOGIC: Cranial nerves II through XII are intact. Muscle strength 4/5 in all extremities. Sensation intact. Gait not checked.  PSYCHIATRIC: The patient is alert and oriented x 3.  SKIN: No obvious rash, lesion, or ulcer.    LABORATORY PANEL:   CBC  Recent Labs Lab 06/02/15 0427  WBC 6.6  HGB 8.8*  HCT 26.0*  PLT 190   ------------------------------------------------------------------------------------------------------------------  Chemistries   Recent Labs Lab 05/30/15 0953  06/02/15 0427  NA 121*  < > 133*  K 4.7  < > 4.2  CL 86*  < > 104  CO2 26  < > 20*  GLUCOSE 214*  < > 80  BUN 34*  < > 23*  CREATININE 1.24*  < > 0.82  CALCIUM 9.5  < > 8.7*  MG  --   --  1.6*  AST 24  --   --   ALT 13*  --   --   ALKPHOS 75  --   --   BILITOT 0.5  --   --   < > = values in this interval not displayed. ------------------------------------------------------------------------------------------------------------------  Cardiac Enzymes  Recent Labs Lab 05/30/15 0953  TROPONINI 0.05*   ------------------------------------------------------------------------------------------------------------------  RADIOLOGY:  No results found.  EKG:   Orders placed or performed during the hospital encounter of 05/30/15  . EKG 12-Lead  . EKG 12-Lead    ASSESSMENT AND PLAN:   #1 syncope secondary to dehydration,  hyponatremia and UTI.  #2. Acute hyponatremia due to dehydration Improving with normal saline, follow-up BMP. Hold hydrochlorothiazide.  * Dehydration with  chronic kidney disease stage II:  Improved with IV fluids.  #3 UTI: Patient had recent klebsiella UTI, urine cultures showed resistance to ampicillin and she already is on Keflex at nursing home.  Klebsiella, sensitive to rocephin per urine cultures. Treated with Rocephin. Change to keflex.  #4 chronic diastolic heart  failure: Compensated. Hold diuretics secondary to hyponatremia. #5 diabetes mellitus type 2: continue sliding scale.  #6 severe aortic stenosis: Patient is on Coreg only. #7 generalized weakness: Physical therapy consult suggested home PT, the pt refuses. #8 hyperlipidemia: Continue statins.   diarrhea. She was on Colace twice a day and Dulcolax when necessary. stopped Colace.  C diff negative. Imodium prn.  Hypomagnesemia. Mag iv.  She refused HHPT.  All the records are reviewed and case discussed with Care Management/Social Workerr. Management plans discussed with the patient, family and they are in agreement.  CODE STATUS: full code   TOTAL TIME TAKING CARE OF THIS PATIENT: 33 minutes.  Greater than 50% time was spent on coordination of care and face-to-face counseling.  POSSIBLE D/C IN 1-2 DAYS, DEPENDING ON CLINICAL CONDITION.   Demetrios Loll M.D on 06/02/2015 at 12:46 PM  Between 7am to 6pm - Pager - [phone]  After 6pm go to [email or web address] - password [email or web address] [facility]  Office  [phone]  CC: Primary care physician; Dion Body, MD

## 2015-06-02 NOTE — Care Management (Signed)
Rolling Walker obtained from [facility]. Discharge to home tomorrow per Dr. Bridgett Larsson. Declines home health in the home at this time. [name] RN MSN CCM Care Management [phone]

## 2015-06-03 LAB — BASIC METABOLIC PANEL
Anion gap: 7 (ref 5–15)
BUN: 19 mg/dL (ref 6–20)
CHLORIDE: 105 mmol/L (ref 101–111)
CO2: 22 mmol/L (ref 22–32)
CREATININE: 0.8 mg/dL (ref 0.44–1.00)
Calcium: 9 mg/dL (ref 8.9–10.3)
GFR calc Af Amer: >60 mL/min (ref >60)
GFR calc non Af Amer: >60 mL/min (ref >60)
GLUCOSE: 85 mg/dL (ref 65–99)
POTASSIUM: 4.3 mmol/L (ref 3.5–5.1)
Sodium: 134 mmol/L — ABNORMAL LOW (ref 135–145)

## 2015-06-03 LAB — GLUCOSE, CAPILLARY: Glucose-Capillary: 79 mg/dL (ref 65–99)

## 2015-06-03 LAB — MAGNESIUM: Magnesium: 1.8 mg/dL (ref 1.7–2.4)

## 2015-06-03 MED ORDER — CEPHALEXIN 500 MG PO CAPS: 500.0000 mg | ORAL_CAPSULE | Freq: Four times a day (QID) | ORAL | Status: DC

## 2015-06-03 NOTE — Care Management Note (Addendum)
Case Management Note  Patient Details  Name: Margaret Matthews MRN: [id number] Date of Birth: 1930/06/22  Subjective/Objective:     Discharge home today. [name] refuses offer of home health services. SW provided life alert information. Has a rolling walker at home.                Action/Plan:   Expected Discharge Date:                  Expected Discharge Plan:     In-House Referral:     Discharge planning Services     Post Acute Care Choice:    Choice offered to:     DME Arranged:    DME Agency:     HH Arranged:    [place] Agency:     Status of Service:     Medicare Important Message Given:  Yes Date Medicare IM Given:    Medicare IM give by:    Date Additional Medicare IM Given:    Additional Medicare Important Message give by:     If discussed at [place] of Stay Meetings, dates discussed:    Additional Comments:  Ashey Tramontana A, RN 06/03/2015, 9:21 AM

## 2015-06-03 NOTE — Progress Notes (Signed)
Pt has been discharged home. Discharge instructions given and explained to pt and pt's son. Both verbalized understanding. Meds and f/u appointments reviewed with pt's son. RX given.

## 2015-06-03 NOTE — Discharge Instructions (Signed)
Heart Failure Clinic appointment on Jun 15, 2015 at 9:00am with Darylene Price, [place]. Please call [phone] to reschedule.   Heart healthy and ADA diet. Activity as tolerated.

## 2015-06-03 NOTE — Clinical Social Work Note (Signed)
Pt is ready for discharge today and will return home. Pt's son Jenny Reichmann will provide transportation. CSW provided information for Med Alert, which pt is interested in. CSW is signing off as no further needs identified.   Darden Dates, MSW, LCSW  Clinical Social Worker [phone]

## 2015-06-03 NOTE — Discharge Summary (Signed)
[facility] [place] NAME: Margaret Matthews    MR#:  [id number]  DATE OF BIRTH:  23-Oct-1930  DATE OF ADMISSION:  05/30/2015 ADMITTING PHYSICIAN: Epifanio Lesches, MD  DATE OF DISCHARGE: 06/03/2015 11:19 AM  PRIMARY CARE PHYSICIAN: Dion Body, MD    ADMISSION DIAGNOSIS:  Hyponatremia [E87.1] UTI (lower urinary tract infection) [N39.0] Syncope, unspecified syncope type [R55]   DISCHARGE DIAGNOSIS:  syncope secondary to dehydration, hyponatremia and UTI. klebsiella UTI Acute hyponatremia due to dehydration  Dehydration with chronic kidney disease stage II SECONDARY DIAGNOSIS:   Past Medical History  Diagnosis Date  . Hypertension   . Arthritis   . CKD (chronic kidney disease) stage 2, GFR 60-89 ml/min     baseline creatinine 1.5  . Renal cell carcinoma ([place])   . Breast cancer ([place])   . Anemia   . Osteopenia   . GERD (gastroesophageal reflux disease)   . PUD (peptic ulcer disease)   . Glaucoma     HOSPITAL COURSE:   #1 syncope secondary to dehydration, hyponatremia and UTI.  #2. Acute hyponatremia due to dehydration Improved with normal saline iv. Hold hydrochlorothiazide.  * Dehydration with chronic kidney disease stage II: Improved with IV fluids.  #3 UTI: Patient had recent klebsiella UTI, urine cultures showed resistance to ampicillin and she already is on Keflex at nursing home.  Klebsiella, sensitive to rocephin per urine cultures. Treated with Rocephin. Changed to keflex.  #4 chronic diastolic heart failure: Compensated. Hold diuretics secondary to hyponatremia. #5 diabetes mellitus type 2: continue sliding scale.  #6 severe aortic stenosis: Patient is on Coreg only. #7 generalized weakness: Physical therapy consult suggested home PT, the pt refuses. #8 hyperlipidemia: Continue statins.  diarrhea. She was on Colace twice a day and Dulcolax when necessary. stopped Colace.  C diff negative.  Imodium prn.  Hypomagnesemia. Improved with Mag iv.  She refused HHPT.  DISCHARGE CONDITIONS:   Stable, discharged to home today.  CONSULTS OBTAINED:  Treatment Team:  Clyde Canterbury, MD  DRUG ALLERGIES:   Allergies  Allergen Reactions  . Statins Other (See Comments)    Reaction: Pt says "it was showing up in her liver."     DISCHARGE MEDICATIONS:   Discharge Medication List as of 06/03/2015 10:35 AM    START taking these medications   Details  cephALEXin (KEFLEX) 500 MG capsule Take 1 capsule (500 mg total) by mouth 4 (four) times daily., Starting 06/03/2015, Until Discontinued, Print      CONTINUE these medications which have NOT CHANGED   Details  acidophilus (RISAQUAD) CAPS capsule Take 1 capsule by mouth 2 (two) times daily., Starting 07/23/2014, Until Discontinued, Print    amLODipine (NORVASC) 5 MG tablet Take 1 tablet by mouth daily., Starting 04/13/2015, Until Discontinued, Historical Med    aspirin EC 81 MG tablet Take 81 mg by mouth daily., Until Discontinued, Historical Med    beta carotene w/minerals (OCUVITE) tablet Take 1 tablet by mouth every evening., Until Discontinued, Historical Med    budesonide (ENTOCORT EC) 3 MG 24 hr capsule Take 3 mg by mouth daily. 3 dail2 weeks 2daily after, Until Discontinued, Historical Med    Calcium Carbonate-Vit D-Min (CALCIUM 600+D PLUS MINERALS PO) Take 1 tablet by mouth 2 (two) times daily. , Until Discontinued, Historical Med    carvedilol (COREG) 3.125 MG tablet Take 1 tablet (3.125 mg total) by mouth 2 (two) times daily with a meal., Starting 05/12/2015, Until Discontinued, Normal  CRANBERRY PO Take 1 capsule by mouth every evening., Until Discontinued, Historical Med    dorzolamide (TRUSOPT) 2 % ophthalmic solution Place 1 drop into both eyes daily., Starting 05/12/2015, Until Discontinued, Normal    enalapril (VASOTEC) 5 MG tablet Take 1 tablet (5 mg total) by mouth 2 (two) times daily., Starting 07/23/2014, Until  Discontinued, Print    hydroxychloroquine (PLAQUENIL) 200 MG tablet Take 200 mg by mouth daily., Until Discontinued, Historical Med    IRON-VITAMIN C PO Take 1 tablet by mouth every evening., Until Discontinued, Historical Med    latanoprost (XALATAN) 0.005 % ophthalmic solution Place 1 drop into both eyes at bedtime., Starting 05/12/2015, Until Discontinued, Normal    Loperamide HCl (IMODIUM PO) Take 1 tablet by mouth daily as needed (for diarrhea). , Until Discontinued, Historical Med    pantoprazole (PROTONIX) 40 MG tablet Take 1 tablet by mouth daily., Starting 02/17/2015, Until Discontinued, Historical Med    pravastatin (PRAVACHOL) 20 MG tablet Take 20 mg by mouth every evening., Until Discontinued, Historical Med      STOP taking these medications     feeding supplement, ENSURE ENLIVE, (ENSURE ENLIVE) LIQD      mesalamine (PENTASA) 250 MG CR capsule          DISCHARGE INSTRUCTIONS:    If you experience worsening of your admission symptoms, develop shortness of breath, life threatening emergency, suicidal or homicidal thoughts you must seek medical attention immediately by calling 911 or calling your MD immediately  if symptoms less severe.  You Must read complete instructions/literature along with all the possible adverse reactions/side effects for all the Medicines you take and that have been prescribed to you. Take any new Medicines after you have completely understood and accept all the possible adverse reactions/side effects.   Please note  You were cared for by a hospitalist during your hospital stay. If you have any questions about your discharge medications or the care you received while you were in the hospital after you are discharged, you can call the unit and asked to speak with the hospitalist on call if the hospitalist that took care of you is not available. Once you are discharged, your primary care physician will handle any further medical issues. Please note that  NO REFILLS for any discharge medications will be authorized once you are discharged, as it is imperative that you return to your primary care physician (or establish a relationship with a primary care physician if you do not have one) for your aftercare needs so that they can reassess your need for medications and monitor your lab values.    Today   SUBJECTIVE   No complaint.   VITAL SIGNS:  Blood pressure 163/55, pulse 76, temperature 98.4 F (36.9 C), temperature source Oral, resp. rate 18, height 5\' 2"  (1.575 m), weight 50.094 kg (110 lb 7 oz), SpO2 97 %.  I/O:   Intake/Output Summary (Last 24 hours) at 06/03/15 1442 Last data filed at 06/03/15 1045  Gross per 24 hour  Intake    480 ml  Output    200 ml  Net    280 ml    PHYSICAL EXAMINATION:  GENERAL:  82 y.o.-year-old patient lying in the bed with no acute distress.  EYES: Pupils equal, round, reactive to light and accommodation. No scleral icterus. Extraocular muscles intact.  HEENT: Head atraumatic, normocephalic. Oropharynx and nasopharynx clear.  NECK:  Supple, no jugular venous distention. No thyroid enlargement, no tenderness.  LUNGS: Normal breath sounds bilaterally,  no wheezing, rales,rhonchi or crepitation. No use of accessory muscles of respiration.  CARDIOVASCULAR: S1, S2 normal. No murmurs, rubs, or gallops.  ABDOMEN: Soft, non-tender, non-distended. Bowel sounds present. No organomegaly or mass.  EXTREMITIES: No pedal edema, cyanosis, or clubbing.  NEUROLOGIC: Cranial nerves II through XII are intact. Muscle strength 5/5 in all extremities. Sensation intact. Gait not checked.  PSYCHIATRIC: The patient is alert and oriented x 3.  SKIN: No obvious rash, lesion, or ulcer.   DATA REVIEW:   CBC  Recent Labs Lab 06/02/15 0427  WBC 6.6  HGB 8.8*  HCT 26.0*  PLT 190    Chemistries   Recent Labs Lab 05/30/15 0953  06/03/15 0500  NA 121*  < > 134*  K 4.7  < > 4.3  CL 86*  < > 105  CO2 26  < > 22   GLUCOSE 214*  < > 85  BUN 34*  < > 19  CREATININE 1.24*  < > 0.80  CALCIUM 9.5  < > 9.0  MG  --   < > 1.8  AST 24  --   --   ALT 13*  --   --   ALKPHOS 75  --   --   BILITOT 0.5  --   --   < > = values in this interval not displayed.  Cardiac Enzymes  Recent Labs Lab 05/30/15 0953  TROPONINI 0.05*    Microbiology Results  Results for orders placed or performed during the hospital encounter of 05/30/15  Urine culture     Status: Abnormal   Collection Time: 05/30/15  9:53 AM  Result Value Ref Range Status   Specimen Description URINE, RANDOM  Final   Special Requests NONE  Final   Culture >=100,000 COLONIES/mL KLEBSIELLA PNEUMONIAE (A)  Final   Report Status 06/01/2015 FINAL  Final   Organism ID, Bacteria KLEBSIELLA PNEUMONIAE (A)  Final      Susceptibility   Klebsiella pneumoniae - MIC*    AMPICILLIN 16 RESISTANT Resistant     CEFAZOLIN <=4 SENSITIVE Sensitive     CEFTRIAXONE <=1 SENSITIVE Sensitive     CIPROFLOXACIN <=0.25 SENSITIVE Sensitive     GENTAMICIN <=1 SENSITIVE Sensitive     IMIPENEM <=0.25 SENSITIVE Sensitive     NITROFURANTOIN 64 INTERMEDIATE Intermediate     TRIMETH/SULFA <=20 SENSITIVE Sensitive     AMPICILLIN/SULBACTAM 4 SENSITIVE Sensitive     PIP/TAZO <=4 SENSITIVE Sensitive     Extended ESBL NEGATIVE Sensitive     * >=100,000 COLONIES/mL KLEBSIELLA PNEUMONIAE  MRSA PCR Screening     Status: None   Collection Time: 05/30/15  3:05 PM  Result Value Ref Range Status   MRSA by PCR NEGATIVE NEGATIVE Final    Comment:        The GeneXpert MRSA Assay (FDA approved for NASAL specimens only), is one component of a comprehensive MRSA colonization surveillance program. It is not intended to diagnose MRSA infection nor to guide or monitor treatment for MRSA infections.   C difficile quick scan w PCR reflex     Status: None   Collection Time: 05/30/15  3:14 PM  Result Value Ref Range Status   C Diff antigen NEGATIVE NEGATIVE Final   C Diff toxin  NEGATIVE NEGATIVE Final   C Diff interpretation Negative for C. difficile  Final    RADIOLOGY:  No results found.      Management plans discussed with the patient, family and they are in agreement.  CODE STATUS:  Code Status History    Date Active Date Inactive Code Status Order ID Comments User Context   05/30/2015 11:35 AM 06/03/2015  2:20 PM Full Code [id number]  Epifanio Lesches, MD ED   05/08/2015  7:00 AM 05/12/2015  6:55 PM Full Code [id number]  Mikael Spray, NP ED   07/20/2014 10:30 PM 07/23/2014  5:03 PM Full Code [id number]  Gladstone Lighter, MD ED      TOTAL TIME TAKING CARE OF THIS PATIENT: 36 minutes.    Demetrios Loll M.D on 06/03/2015 at 2:42 PM  Between 7am to 6pm - Pager - [phone]  After 6pm go to [email or web address] - password [email or web address] [facility]  Office  [phone]  CC: Primary care physician; Dion Body, MD

## 2015-06-03 NOTE — Care Management Important Message (Signed)
Important Message  Patient Details  Name: Margaret Matthews MRN: [id number] Date of Birth: 1930-11-21   Medicare Important Message Given:  Yes    Loretta Doutt A, RN 06/03/2015, 7:14 AM

## 2015-06-03 NOTE — Progress Notes (Signed)
Physical Therapy Treatment Patient Details Name: Margaret Matthews MRN: [id number] DOB: 1930/08/17 Today's Date: 06/03/2015    History of Present Illness Margaret Matthews is a 82 y.o. female with a known history of aortic stenosis, diabetes mellitus type 2, hypertension, stage II chronic kidney disease came in from [facility] because of syncope. Patient passed out for 5 minutes according to the chart while she was eating breakfast. Did not have any headache or facial droop or weakness. Blood glucose more than 150 that time. No seizure activity reported. No recent fever. Patient sodium found to be 121, also has UTI. Patient is very hard of hearing. Complains of dizziness, generalized weakness since 2 days. Recently was here on April 2 and third secondary to acute hypoxic respiratory failure and pulmonary edema that time patient was given hydrochlorothiazide. Patient right now has no extremity edema and no hypoxia. It was discharged on April 6 diagnosis of acute diastolic heart failure, hypoxia, severe aortic stenosis, klebsiella UTI. And discharged to rehabilitation with Keflex. No falls in the last 12 months    PT Comments    Pt in bed, awake and ready for session.  Pt reports she is going home this morning.  Out of bed with supervision and rail.  Ambulated around nursing unit 180' with rolling walker and min guard.  No lob's noted but decreased gait speed.  Pt reported feeling confident with discharge plan.  Encouraged to use rolling walker at home and she agreed.    Follow Up Recommendations  Home health PT;Other (comment)     Equipment Recommendations  Rolling walker with 5" wheels;Other (comment)    Recommendations for Other Services       Precautions / Restrictions Precautions Precautions: Fall Restrictions Weight Bearing Restrictions: No    Mobility  Bed Mobility Overal bed mobility: Modified Independent Bed Mobility: Supine to Sit     Supine to sit: Modified independent  (Device/Increase time)     General bed mobility comments: uses rail   Transfers Overall transfer level: Needs assistance Equipment used: Rolling walker (2 wheeled) Transfers: Sit to/from Stand Sit to Stand: Supervision         General transfer comment: Pt demonstrates safe hand placement with sit to stand transfers. Good stability once in standing without UE support in wide stance. No LE buckling noted.  Ambulation/Gait Ambulation/Gait assistance: Min guard Ambulation Distance (Feet): 250 Feet Assistive device: Rolling walker (2 wheeled) Gait Pattern/deviations: Step-through pattern Gait velocity: Decreased but functional for full household mobility Gait velocity interpretation: <1.8 ft/sec, indicative of risk for recurrent falls General Gait Details: Pt denies fatigue or DOE during ambulation. Vitals intermittently monitored and remain WNL throughout distance. Good stability with rolling walker. Able to maintain conversation throughout walking distance   Stairs            Wheelchair Mobility    Modified Rankin (Stroke Patients Only)       Balance Overall balance assessment: Needs assistance Sitting-balance support: Feet supported Sitting balance-Leahy Scale: Good Sitting balance - Comments: maintains midline and able to weight shift   Standing balance support: No upper extremity supported Standing balance-Leahy Scale: Fair                      Cognition Arousal/Alertness: Awake/alert Behavior During Therapy: WFL for tasks assessed/performed Overall Cognitive Status: Within Functional Limits for tasks assessed                      Exercises  General Comments        Pertinent Vitals/Pain Pain Assessment: No/denies pain    Home Living                      Prior Function            PT Goals (current goals can now be found in the care plan section) Acute Rehab PT Goals Patient Stated Goal: Return to prior level of  function at home Time For Goal Achievement: 06/14/15 Potential to Achieve Goals: Fair Progress towards PT goals: Progressing toward goals    Frequency  Min 2X/week    PT Plan Current plan remains appropriate    Co-evaluation             End of Session Equipment Utilized During Treatment: Gait belt Activity Tolerance: Patient tolerated treatment well Patient left: in chair;with call bell/phone within reach;with chair alarm set     Time: [id number] PT Time Calculation (min) (ACUTE ONLY): 17 min  Charges:  $Gait Training: 8-22 mins                    G Codes:      Chesley Noon, PTA 06/03/2015, 9:35 AM

## 2015-06-03 NOTE — Clinical Social Work Note (Signed)
Clinical Social Work Assessment  Patient Details  Name: MARKETA MIDKIFF MRN: [id number] Date of Birth: 1930-08-05  Date of referral:  06/02/15               Reason for consult:  Facility Placement                Permission sought to share information with:  Family Supports Permission granted to share information::  Yes, Verbal Permission Granted  Name::     Gwyndolyn Saxon and Jenny Reichmann, sons   Housing/Transportation Living arrangements for the past 2 months:  Single Family Home Source of Information:  Patient, Adult Children Patient Interpreter Needed:  None Criminal Activity/Legal Involvement Pertinent to Current Situation/Hospitalization:   No Significant Relationships:  Adult Children Lives with:  Adult Children Do you feel safe going back to the place where you live?  Yes Need for family participation in patient care:  Yes (Comment)  Care giving concerns:  Pt would benefit from Med Alert as pt is home alone at times.  Social Worker assessment / plan:  CSW met with pt to address consult as pt was admitted from [facility]. PT is recommending HHPT, which pt refused. Pt is able to return to [facility], however insurance would only qualify for a few days as pt ambulated 250 ft. CSW inquired if pt would like to return. Pt shared that she will return home with her sons. They will be assisting in her care. Pt declined home home health as she will be staying on the second floor and will not be able to make it down the stairs to unlock the door. CSW inquired about how she will eat and she reported that her sons will be providing her meals. Pt has a bathroom upstairs, and her sons will provide support so that pt can get down stairs safely.   CSW updated RNCM. Per RNCM, pt does not have a Med Alert. CSW will get information on this. CSW will continue to follow.   Employment status:  Retired Nurse, adult PT Recommendations:  Home with [place] / Referral  to community resources:  Other (Comment Required) (Med Alert)  Patient/Family's Response to care:  Pt was appreciative of CSW support.   Patient/Family's Understanding of and Emotional Response to Diagnosis, Current Treatment, and Prognosis:  Pt would like to return home and her sons will be providing care.   Emotional Assessment Appearance:  Appears stated age Attitude/Demeanor/Rapport:  Other (Appropriate) Affect (typically observed):  Accepting, Adaptable, Pleasant Orientation:  Oriented to Self, Oriented to Place, Oriented to  Time, Oriented to Situation Alcohol / Substance use:  Never Used Psych involvement (Current and /or in the community):  No (Comment)  Discharge Needs  Concerns to be addressed:  Adjustment to Illness Readmission within the last 30 days:  Yes Current discharge risk:  Terminally ill Barriers to Discharge:  Continued Medical Work up   [employer], LCSW 06/03/2015, 11:13 AM

## 2015-06-15 ENCOUNTER — Encounter: Payer: Self-pay | Admitting: Family

## 2015-06-15 ENCOUNTER — Ambulatory Visit: Payer: Medicare Other | Attending: Family | Admitting: Family

## 2015-06-15 VITALS — BP 167/66 | HR 81 | Resp 20 | Ht 60.0 in | Wt 108.0 lb

## 2015-06-15 DIAGNOSIS — I130 Hypertensive heart and chronic kidney disease with heart failure and stage 1 through stage 4 chronic kidney disease, or unspecified chronic kidney disease: Secondary | ICD-10-CM | POA: Insufficient documentation

## 2015-06-15 DIAGNOSIS — Z8249 Family history of ischemic heart disease and other diseases of the circulatory system: Secondary | ICD-10-CM | POA: Diagnosis not present

## 2015-06-15 DIAGNOSIS — Z9012 Acquired absence of left breast and nipple: Secondary | ICD-10-CM | POA: Diagnosis not present

## 2015-06-15 DIAGNOSIS — Z85528 Personal history of other malignant neoplasm of kidney: Secondary | ICD-10-CM | POA: Insufficient documentation

## 2015-06-15 DIAGNOSIS — K219 Gastro-esophageal reflux disease without esophagitis: Secondary | ICD-10-CM | POA: Insufficient documentation

## 2015-06-15 DIAGNOSIS — Z87891 Personal history of nicotine dependence: Secondary | ICD-10-CM | POA: Insufficient documentation

## 2015-06-15 DIAGNOSIS — D649 Anemia, unspecified: Secondary | ICD-10-CM | POA: Diagnosis not present

## 2015-06-15 DIAGNOSIS — H9190 Unspecified hearing loss, unspecified ear: Secondary | ICD-10-CM | POA: Insufficient documentation

## 2015-06-15 DIAGNOSIS — I5032 Chronic diastolic (congestive) heart failure: Secondary | ICD-10-CM | POA: Diagnosis not present

## 2015-06-15 DIAGNOSIS — Z905 Acquired absence of kidney: Secondary | ICD-10-CM | POA: Insufficient documentation

## 2015-06-15 DIAGNOSIS — M1990 Unspecified osteoarthritis, unspecified site: Secondary | ICD-10-CM | POA: Insufficient documentation

## 2015-06-15 DIAGNOSIS — F419 Anxiety disorder, unspecified: Secondary | ICD-10-CM

## 2015-06-15 DIAGNOSIS — N182 Chronic kidney disease, stage 2 (mild): Secondary | ICD-10-CM | POA: Diagnosis not present

## 2015-06-15 DIAGNOSIS — Z791 Long term (current) use of non-steroidal anti-inflammatories (NSAID): Secondary | ICD-10-CM | POA: Insufficient documentation

## 2015-06-15 DIAGNOSIS — Z79899 Other long term (current) drug therapy: Secondary | ICD-10-CM | POA: Insufficient documentation

## 2015-06-15 DIAGNOSIS — Z7982 Long term (current) use of aspirin: Secondary | ICD-10-CM | POA: Insufficient documentation

## 2015-06-15 DIAGNOSIS — M8580 Other specified disorders of bone density and structure, unspecified site: Secondary | ICD-10-CM | POA: Insufficient documentation

## 2015-06-15 DIAGNOSIS — Z853 Personal history of malignant neoplasm of breast: Secondary | ICD-10-CM | POA: Diagnosis not present

## 2015-06-15 DIAGNOSIS — I10 Essential (primary) hypertension: Secondary | ICD-10-CM

## 2015-06-15 DIAGNOSIS — Z823 Family history of stroke: Secondary | ICD-10-CM | POA: Diagnosis not present

## 2015-06-15 DIAGNOSIS — R42 Dizziness and giddiness: Secondary | ICD-10-CM | POA: Diagnosis not present

## 2015-06-15 DIAGNOSIS — R05 Cough: Secondary | ICD-10-CM | POA: Insufficient documentation

## 2015-06-15 DIAGNOSIS — H409 Unspecified glaucoma: Secondary | ICD-10-CM | POA: Insufficient documentation

## 2015-06-15 DIAGNOSIS — R5383 Other fatigue: Secondary | ICD-10-CM | POA: Insufficient documentation

## 2015-06-15 DIAGNOSIS — Z9889 Other specified postprocedural states: Secondary | ICD-10-CM | POA: Diagnosis not present

## 2015-06-15 DIAGNOSIS — Z8711 Personal history of peptic ulcer disease: Secondary | ICD-10-CM | POA: Insufficient documentation

## 2015-06-15 NOTE — Patient Instructions (Addendum)
Begin weighing daily and call for an overnight weight gain of > 2 pounds or a weekly weight gain of >5 pounds. 

## 2015-06-15 NOTE — Progress Notes (Signed)
Subjective:    [name], female    DOB: [birth year], 82 y.o.   MRN: [id number]  Congestive Heart Failure Presents for initial visit. The disease course has been improving. Associated symptoms include fatigue. Pertinent negatives include no abdominal pain, chest pain, edema, orthopnea, palpitations or shortness of breath. The symptoms have been improving. Past treatments include salt and fluid restriction, beta blockers and ACE inhibitors. The treatment provided moderate relief. Compliance with prior treatments has been good. Her past medical history is significant for anemia and HTN. There is no history of chronic lung disease or CVA. She has multiple 1st degree relatives with heart disease.  Hypertension This is a chronic problem. The current episode started more than 1 year ago. The problem has been waxing and waning since onset. Associated symptoms include anxiety. Pertinent negatives include no chest pain, headaches, neck pain, palpitations, peripheral edema or shortness of breath. There are no associated agents to hypertension. Risk factors for coronary artery disease include family history and post-menopausal state. Past treatments include beta blockers, ACE inhibitors, diuretics and lifestyle changes. The current treatment provides moderate improvement. There are no compliance problems.  Hypertensive end-organ damage includes kidney disease and heart failure.  Anxiety Presents for initial visit. Onset was more than 5 years ago. The problem has been waxing and waning. Symptoms include excessive worry, hyperventilation, muscle tension and nervous/anxious behavior. Patient reports no chest pain, depressed mood, dizziness, palpitations or shortness of breath. Symptoms occur occasionally. The quality of sleep is good.   Risk factors include recent illness. Her past medical history is significant for anemia, anxiety/panic attacks and CHF. There is no history of chronic lung disease.  Past treatments include benzodiazephines. Compliance with prior treatments has been variable.   Past Medical History  Diagnosis Date  . Hypertension   . Arthritis   . CKD (chronic kidney disease) stage 2, GFR 60-89 ml/min     baseline creatinine 1.5  . Renal cell carcinoma ([place])   . Breast cancer ([place])   . Anemia   . Osteopenia   . GERD (gastroesophageal reflux disease)   . PUD (peptic ulcer disease)   . Glaucoma     Past Surgical History  Procedure Laterality Date  . Breast surgery      Left mastectomy  . Nephrectomy      Right nephrectomy  . Hemiarthroplasty shoulder fracture      Right shoulder  . Hip surgery      Left hip  . Colonoscopy      Family History  Problem Relation Age of Onset  . CAD Mother   . CVA Father   . CAD Sister     Social History  Substance Use Topics  . Smoking status: Former Research scientist (life sciences)  . Smokeless tobacco: Never Used  . Alcohol Use: No    Allergies  Allergen Reactions  . Statins Other (See Comments)    Reaction: Pt says "it was showing up in her liver."     Prior to Admission medications   Medication Sig Start Date End Date Taking? Authorizing Provider  acidophilus (RISAQUAD) CAPS capsule Take 1 capsule by mouth 2 (two) times daily. 07/23/14  Yes Gladstone Lighter, MD  amLODipine (NORVASC) 5 MG tablet Take 1 tablet by mouth daily. 04/13/15  Yes Historical Provider, MD  aspirin EC 81 MG tablet Take 81 mg by mouth daily.   Yes Historical Provider, MD  beta carotene w/minerals (OCUVITE) tablet Take 1 tablet by mouth every evening.  Yes Historical Provider, MD  budesonide (ENTOCORT EC) 3 MG 24 hr capsule Take 3 mg by mouth daily. 3 dail2 weeks 2daily after   Yes Historical Provider, MD  Calcium Carbonate-Vit D-Min (CALCIUM 600+D PLUS MINERALS PO) Take 1 tablet by mouth 2 (two) times daily.    Yes Historical Provider, MD  carvedilol (COREG) 3.125 MG tablet Take 1 tablet (3.125 mg total) by mouth 2 (two) times daily with a meal. Patient taking  differently: Take 6.25 mg by mouth 2 (two) times daily with a meal.  05/12/15  Yes Fritzi Mandes, MD  CRANBERRY PO Take 1 capsule by mouth every evening.   Yes Historical Provider, MD  dorzolamide (TRUSOPT) 2 % ophthalmic solution Place 1 drop into both eyes daily. Patient taking differently: Place 1 drop into both eyes 2 (two) times daily.  05/12/15  Yes Fritzi Mandes, MD  enalapril (VASOTEC) 5 MG tablet Take 1 tablet (5 mg total) by mouth 2 (two) times daily. Patient taking differently: Take 20 mg by mouth 2 (two) times daily.  07/23/14  Yes Gladstone Lighter, MD  hydroxychloroquine (PLAQUENIL) 200 MG tablet Take 200 mg by mouth daily.   Yes Historical Provider, MD  IRON-VITAMIN C PO Take 1 tablet by mouth every evening.   Yes Historical Provider, MD  latanoprost (XALATAN) 0.005 % ophthalmic solution Place 1 drop into both eyes at bedtime. 05/12/15  Yes Fritzi Mandes, MD  Loperamide HCl (IMODIUM PO) Take 1 tablet by mouth daily as needed (for diarrhea).    Yes Historical Provider, MD  pantoprazole (PROTONIX) 40 MG tablet Take 1 tablet by mouth daily. 02/17/15  Yes Historical Provider, MD  pravastatin (PRAVACHOL) 20 MG tablet Take 20 mg by mouth every evening.   Yes Historical Provider, MD      Review of Systems  Constitutional: Positive for fatigue. Negative for appetite change.  HENT: Positive for hearing loss. Negative for congestion, rhinorrhea and sore throat.   Eyes: Negative.   Respiratory: Positive for cough (loose cough). Negative for chest tightness, shortness of breath and wheezing.   Cardiovascular: Negative for chest pain, palpitations and leg swelling.  Gastrointestinal: Negative for abdominal pain and abdominal distention.  Endocrine: Negative.   Genitourinary: Negative.   Musculoskeletal: Negative for back pain and neck pain.  Skin: Negative.   Allergic/Immunologic: Negative.   Neurological: Positive for light-headedness (infrequent). Negative for dizziness and headaches.  Hematological:  Negative for adenopathy. Bruises/bleeds easily.  Psychiatric/Behavioral: Negative for sleep disturbance and dysphoric mood. The patient is nervous/anxious.        Objective:   Physical Exam  Constitutional: She is oriented to person, place, and time. She appears well-developed and well-nourished.  HENT:  Head: Normocephalic and atraumatic.  Eyes: Conjunctivae are normal. Pupils are equal, round, and reactive to light.  Neck: Normal range of motion. Neck supple.  Cardiovascular: Normal rate and regular rhythm.   Pulmonary/Chest: Effort normal. She has no wheezes. She has no rales.  Abdominal: Soft. She exhibits no distension. There is no tenderness.  Musculoskeletal: She exhibits no edema or tenderness.  Neurological: She is alert and oriented to person, place, and time.  Skin: Skin is warm and dry.  Psychiatric: Her behavior is normal. Her mood appears anxious.  Nursing note and vitals reviewed.   BP 167/66 mmHg  Pulse 81  Resp 20  Ht 5' (1.524 m)  Wt 108 lb (48.988 kg)  BMI 21.09 kg/m2  SpO2 99%       Assessment & Plan:  1: Chronic heart  failure with preserved ejection fraction- Patient presents with fatigue upon exertion (Class II). She says that she was a little tired upon walking into the office but feels like her strength is slowly improving. She denies any shortness of breath or swelling in her legs or abdomen. She does weigh herself but admits that she doesn't weigh herself daily. Discussed the importance of weighing herself every day and to call for an overnight weight gain of >2 pounds or a weekly weight gain of >5 pounds. She is not adding any additional salt to her food and tries to eat low sodium foods. Discussed the importance of following a 2000mg  sodium diet and written information was given to her about this. She doesn't have a cardiologist and she was encouraged to get established with one but says that she will wait until she speaks to her PCP about this. 2: HTN-  Blood pressure a little elevated today but she admits that she was very nervous about coming to the appointment today.  3: Anxiety- Her son says that she tends to get very anxious out of proportion to the event. He says that every time she has to go to the doctor's office, she gets so anxious the night before that she doesn't sleep and then is wore out after the visit because she's been so anxious about it. Does have medication that she can take if needed but she tries to not take it. Discussed deep breathing exercises and how to perform those so that she can slow her breathing down when she gets anxious.   Medication list was reviewed. Due to patient's anxiety, patient and son would like to not make a follow-up appointment at this time. They were very appreciative of the education but feel like they can handle this at home. The son does say that should she start to have any problems, they will call to make an appointment.

## 2016-04-06 ENCOUNTER — Encounter: Payer: Self-pay | Admitting: Intensive Care

## 2016-04-06 ENCOUNTER — Emergency Department
Admission: EM | Admit: 2016-04-06 | Discharge: 2016-04-06 | Disposition: A | Discharge status: Home / Self Care | Payer: Medicare Other | Attending: Emergency Medicine | Admitting: Emergency Medicine

## 2016-04-06 ENCOUNTER — Emergency Department: Payer: Medicare Other

## 2016-04-06 DIAGNOSIS — Y999 Unspecified external cause status: Secondary | ICD-10-CM | POA: Insufficient documentation

## 2016-04-06 DIAGNOSIS — W1839XA Other fall on same level, initial encounter: Secondary | ICD-10-CM | POA: Insufficient documentation

## 2016-04-06 DIAGNOSIS — Y92481 Parking lot as the place of occurrence of the external cause: Secondary | ICD-10-CM | POA: Insufficient documentation

## 2016-04-06 DIAGNOSIS — Y9389 Activity, other specified: Secondary | ICD-10-CM | POA: Insufficient documentation

## 2016-04-06 DIAGNOSIS — I130 Hypertensive heart and chronic kidney disease with heart failure and stage 1 through stage 4 chronic kidney disease, or unspecified chronic kidney disease: Secondary | ICD-10-CM | POA: Insufficient documentation

## 2016-04-06 DIAGNOSIS — I509 Heart failure, unspecified: Secondary | ICD-10-CM | POA: Diagnosis not present

## 2016-04-06 DIAGNOSIS — S0081XA Abrasion of other part of head, initial encounter: Secondary | ICD-10-CM | POA: Insufficient documentation

## 2016-04-06 DIAGNOSIS — Z853 Personal history of malignant neoplasm of breast: Secondary | ICD-10-CM | POA: Diagnosis not present

## 2016-04-06 DIAGNOSIS — S42292A Other displaced fracture of upper end of left humerus, initial encounter for closed fracture: Secondary | ICD-10-CM | POA: Insufficient documentation

## 2016-04-06 DIAGNOSIS — N182 Chronic kidney disease, stage 2 (mild): Secondary | ICD-10-CM | POA: Diagnosis not present

## 2016-04-06 DIAGNOSIS — Z23 Encounter for immunization: Secondary | ICD-10-CM | POA: Diagnosis not present

## 2016-04-06 DIAGNOSIS — T148XXA Other injury of unspecified body region, initial encounter: Secondary | ICD-10-CM

## 2016-04-06 DIAGNOSIS — S0990XA Unspecified injury of head, initial encounter: Secondary | ICD-10-CM | POA: Diagnosis present

## 2016-04-06 DIAGNOSIS — Z7982 Long term (current) use of aspirin: Secondary | ICD-10-CM | POA: Insufficient documentation

## 2016-04-06 DIAGNOSIS — Z79899 Other long term (current) drug therapy: Secondary | ICD-10-CM | POA: Diagnosis not present

## 2016-04-06 DIAGNOSIS — Z87891 Personal history of nicotine dependence: Secondary | ICD-10-CM | POA: Diagnosis not present

## 2016-04-06 HISTORY — DX: Hypo-osmolality and hyponatremia: E87.1

## 2016-04-06 MED ORDER — OXYCODONE-ACETAMINOPHEN 5-325 MG PO TABS: 1.0000 | ORAL_TABLET | Freq: Four times a day (QID) | ORAL | 0 refills | Status: DC | PRN

## 2016-04-06 MED ORDER — TETANUS-DIPHTH-ACELL PERTUSSIS 5-2.5-18.5 LF-MCG/0.5 IM SUSP
0.5000 mL | Freq: Once | INTRAMUSCULAR | Status: AC
Start: 2016-04-06 — End: 2016-04-06
  Administered 2016-04-06: 0.5 mL via INTRAMUSCULAR
  Filled 2016-04-06: qty 0.5
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
## 2016-04-06 NOTE — ED Notes (Signed)
Patient transported to X-ray 

## 2017-02-28 ENCOUNTER — Emergency Department: Payer: Medicare Other

## 2017-02-28 ENCOUNTER — Other Ambulatory Visit: Payer: Self-pay

## 2017-02-28 ENCOUNTER — Inpatient Hospital Stay
Admission: EM | Admit: 2017-02-28 | Discharge: 2017-03-04 | DRG: 291 | Disposition: A | Discharge status: Home Health | Payer: Medicare Other | Attending: Internal Medicine | Admitting: Internal Medicine

## 2017-02-28 DIAGNOSIS — Z7982 Long term (current) use of aspirin: Secondary | ICD-10-CM

## 2017-02-28 DIAGNOSIS — Z85528 Personal history of other malignant neoplasm of kidney: Secondary | ICD-10-CM | POA: Diagnosis not present

## 2017-02-28 DIAGNOSIS — J189 Pneumonia, unspecified organism: Secondary | ICD-10-CM | POA: Diagnosis present

## 2017-02-28 DIAGNOSIS — K219 Gastro-esophageal reflux disease without esophagitis: Secondary | ICD-10-CM | POA: Diagnosis present

## 2017-02-28 DIAGNOSIS — I5021 Acute systolic (congestive) heart failure: Secondary | ICD-10-CM | POA: Diagnosis present

## 2017-02-28 DIAGNOSIS — N182 Chronic kidney disease, stage 2 (mild): Secondary | ICD-10-CM | POA: Diagnosis present

## 2017-02-28 DIAGNOSIS — Z9012 Acquired absence of left breast and nipple: Secondary | ICD-10-CM | POA: Diagnosis not present

## 2017-02-28 DIAGNOSIS — Z905 Acquired absence of kidney: Secondary | ICD-10-CM

## 2017-02-28 DIAGNOSIS — I350 Nonrheumatic aortic (valve) stenosis: Secondary | ICD-10-CM | POA: Diagnosis present

## 2017-02-28 DIAGNOSIS — Z8711 Personal history of peptic ulcer disease: Secondary | ICD-10-CM

## 2017-02-28 DIAGNOSIS — Z87891 Personal history of nicotine dependence: Secondary | ICD-10-CM | POA: Diagnosis not present

## 2017-02-28 DIAGNOSIS — H9190 Unspecified hearing loss, unspecified ear: Secondary | ICD-10-CM | POA: Diagnosis present

## 2017-02-28 DIAGNOSIS — Z853 Personal history of malignant neoplasm of breast: Secondary | ICD-10-CM | POA: Diagnosis not present

## 2017-02-28 DIAGNOSIS — R946 Abnormal results of thyroid function studies: Secondary | ICD-10-CM | POA: Diagnosis present

## 2017-02-28 DIAGNOSIS — J810 Acute pulmonary edema: Secondary | ICD-10-CM

## 2017-02-28 DIAGNOSIS — D72829 Elevated white blood cell count, unspecified: Secondary | ICD-10-CM | POA: Diagnosis present

## 2017-02-28 DIAGNOSIS — I509 Heart failure, unspecified: Secondary | ICD-10-CM

## 2017-02-28 DIAGNOSIS — I130 Hypertensive heart and chronic kidney disease with heart failure and stage 1 through stage 4 chronic kidney disease, or unspecified chronic kidney disease: Secondary | ICD-10-CM | POA: Diagnosis present

## 2017-02-28 DIAGNOSIS — Z8249 Family history of ischemic heart disease and other diseases of the circulatory system: Secondary | ICD-10-CM

## 2017-02-28 DIAGNOSIS — Z66 Do not resuscitate: Secondary | ICD-10-CM | POA: Diagnosis present

## 2017-02-28 DIAGNOSIS — J9601 Acute respiratory failure with hypoxia: Secondary | ICD-10-CM | POA: Diagnosis present

## 2017-02-28 DIAGNOSIS — R0902 Hypoxemia: Secondary | ICD-10-CM

## 2017-02-28 DIAGNOSIS — J449 Chronic obstructive pulmonary disease, unspecified: Secondary | ICD-10-CM | POA: Diagnosis present

## 2017-02-28 DIAGNOSIS — R739 Hyperglycemia, unspecified: Secondary | ICD-10-CM | POA: Diagnosis present

## 2017-02-28 DIAGNOSIS — Z79899 Other long term (current) drug therapy: Secondary | ICD-10-CM

## 2017-02-28 DIAGNOSIS — R0602 Shortness of breath: Secondary | ICD-10-CM | POA: Diagnosis present

## 2017-02-28 DIAGNOSIS — I5043 Acute on chronic combined systolic (congestive) and diastolic (congestive) heart failure: Secondary | ICD-10-CM | POA: Diagnosis present

## 2017-02-28 DIAGNOSIS — M8580 Other specified disorders of bone density and structure, unspecified site: Secondary | ICD-10-CM | POA: Diagnosis present

## 2017-02-28 DIAGNOSIS — J440 Chronic obstructive pulmonary disease with acute lower respiratory infection: Secondary | ICD-10-CM | POA: Diagnosis present

## 2017-02-28 HISTORY — DX: Acquired absence of kidney: Z90.5

## 2017-02-28 LAB — CBC WITH DIFFERENTIAL/PLATELET
Basophils Absolute: 0.2 10*3/uL — ABNORMAL HIGH (ref 0–0.1)
Basophils Relative: 1 %
Eosinophils Absolute: 0.1 10*3/uL (ref 0–0.7)
Eosinophils Relative: 1 %
HCT: 38.2 % (ref 35.0–47.0)
HEMOGLOBIN: 12.5 g/dL (ref 12.0–16.0)
Lymphocytes Relative: 12 %
Lymphs Abs: 1.7 10*3/uL (ref 1.0–3.6)
MCH: 29.6 pg (ref 26.0–34.0)
MCHC: 32.9 g/dL (ref 32.0–36.0)
MCV: 90 fL (ref 80.0–100.0)
MONOS PCT: 7 %
Monocytes Absolute: 1.1 10*3/uL — ABNORMAL HIGH (ref 0.2–0.9)
NEUTROS PCT: 79 %
Neutro Abs: 11.7 10*3/uL — ABNORMAL HIGH (ref 1.4–6.5)
Platelets: 280 10*3/uL (ref 150–440)
RBC: 4.24 MIL/uL (ref 3.80–5.20)
RDW: 15.1 % — AB (ref 11.5–14.5)
WBC: 14.8 10*3/uL — ABNORMAL HIGH (ref 3.6–11.0)

## 2017-02-28 LAB — BLOOD GAS, ARTERIAL
Acid-base deficit: 1.7 mmol/L (ref 0.0–2.0)
Bicarbonate: 26.8 mmol/L (ref 20.0–28.0)
FIO2: 1
MECHANICAL RATE: 8
O2 Saturation: 99.9 %
PATIENT TEMPERATURE: 37
PH ART: 7.25 — AB (ref 7.350–7.450)
pCO2 arterial: 61 mmHg — ABNORMAL HIGH (ref 32.0–48.0)
pO2, Arterial: 322 mmHg — ABNORMAL HIGH (ref 83.0–108.0)

## 2017-02-28 LAB — COMPREHENSIVE METABOLIC PANEL
ALBUMIN: 3.8 g/dL (ref 3.5–5.0)
ALK PHOS: 82 U/L (ref 38–126)
ALT: 13 U/L — ABNORMAL LOW (ref 14–54)
ANION GAP: 10 (ref 5–15)
AST: 24 U/L (ref 15–41)
BUN: 23 mg/dL — ABNORMAL HIGH (ref 6–20)
CHLORIDE: 103 mmol/L (ref 101–111)
CO2: 25 mmol/L (ref 22–32)
Calcium: 9.4 mg/dL (ref 8.9–10.3)
Creatinine, Ser: 0.8 mg/dL (ref 0.44–1.00)
GFR calc Af Amer: >60 mL/min (ref >60)
GFR calc non Af Amer: >60 mL/min (ref >60)
GLUCOSE: 336 mg/dL — AB (ref 65–99)
POTASSIUM: 4 mmol/L (ref 3.5–5.1)
Sodium: 138 mmol/L (ref 135–145)
Total Bilirubin: 0.6 mg/dL (ref 0.3–1.2)
Total Protein: 6.9 g/dL (ref 6.5–8.1)

## 2017-02-28 LAB — HEMOGLOBIN A1C
Hgb A1c MFr Bld: 4.9 % (ref 4.8–5.6)
MEAN PLASMA GLUCOSE: 93.93 mg/dL

## 2017-02-28 LAB — BRAIN NATRIURETIC PEPTIDE: B NATRIURETIC PEPTIDE 5: 1073 pg/mL — AB (ref 0.0–100.0)

## 2017-02-28 LAB — GLUCOSE, CAPILLARY
Glucose-Capillary: 134 mg/dL — ABNORMAL HIGH (ref 65–99)
Glucose-Capillary: 151 mg/dL — ABNORMAL HIGH (ref 65–99)
Glucose-Capillary: 213 mg/dL — ABNORMAL HIGH (ref 65–99)
Glucose-Capillary: 285 mg/dL — ABNORMAL HIGH (ref 65–99)
Glucose-Capillary: 296 mg/dL — ABNORMAL HIGH (ref 65–99)

## 2017-02-28 LAB — MRSA PCR SCREENING: MRSA by PCR: NEGATIVE

## 2017-02-28 LAB — TSH: TSH: 4.601 u[IU]/mL — ABNORMAL HIGH (ref 0.350–4.500)

## 2017-02-28 LAB — TROPONIN I: Troponin I: 0.03 ng/mL (ref <0.03)

## 2017-02-28 LAB — LACTIC ACID, PLASMA: Lactic Acid, Venous: 1.1 mmol/L (ref 0.5–1.9)

## 2017-02-28 LAB — PROCALCITONIN: PROCALCITONIN: 0.41 ng/mL

## 2017-02-28 MED ORDER — OXYCODONE-ACETAMINOPHEN 5-325 MG PO TABS
1.0000 | ORAL_TABLET | Freq: Four times a day (QID) | ORAL | Status: DC | PRN
  Administered 2017-03-04: 1 via ORAL
  Filled 2017-02-28: qty 1

## 2017-02-28 MED ORDER — HYDRALAZINE HCL 20 MG/ML IJ SOLN
10.0000 mg | INTRAMUSCULAR | Status: DC | PRN
  Administered 2017-02-28: 20 mg via INTRAVENOUS
  Administered 2017-02-28: 10 mg via INTRAVENOUS
  Filled 2017-02-28 (×2): qty 1

## 2017-02-28 MED ORDER — PANTOPRAZOLE SODIUM 40 MG PO TBEC
40.0000 mg | DELAYED_RELEASE_TABLET | Freq: Every day | ORAL | Status: DC
  Administered 2017-02-28 – 2017-03-04 (×5): 40 mg via ORAL
  Filled 2017-02-28 (×5): qty 1

## 2017-02-28 MED ORDER — MORPHINE SULFATE (PF) 2 MG/ML IV SOLN
2.0000 mg | INTRAVENOUS | Status: DC | PRN
  Administered 2017-02-28: 2 mg via INTRAVENOUS
  Filled 2017-02-28: qty 1

## 2017-02-28 MED ORDER — ASPIRIN EC 81 MG PO TBEC
81.0000 mg | DELAYED_RELEASE_TABLET | Freq: Every day | ORAL | Status: DC
  Administered 2017-02-28 – 2017-03-04 (×5): 81 mg via ORAL
  Filled 2017-02-28 (×6): qty 1

## 2017-02-28 MED ORDER — ONDANSETRON HCL 4 MG/2ML IJ SOLN: 4.0000 mg | Freq: Four times a day (QID) | INTRAMUSCULAR | Status: DC | PRN

## 2017-02-28 MED ORDER — FUROSEMIDE 10 MG/ML IJ SOLN
40.0000 mg | Freq: Two times a day (BID) | INTRAMUSCULAR | Status: DC
  Administered 2017-02-28 – 2017-03-02 (×6): 40 mg via INTRAVENOUS
  Filled 2017-02-28 (×6): qty 4

## 2017-02-28 MED ORDER — BUDESONIDE 0.5 MG/2ML IN SUSP
0.5000 mg | Freq: Two times a day (BID) | RESPIRATORY_TRACT | Status: DC
  Administered 2017-02-28 – 2017-03-04 (×9): 0.5 mg via RESPIRATORY_TRACT
  Filled 2017-02-28 (×9): qty 2

## 2017-02-28 MED ORDER — PRAVASTATIN SODIUM 20 MG PO TABS
20.0000 mg | ORAL_TABLET | Freq: Every evening | ORAL | Status: DC
  Administered 2017-02-28 – 2017-03-03 (×4): 20 mg via ORAL
  Filled 2017-02-28 (×4): qty 1

## 2017-02-28 MED ORDER — NITROGLYCERIN 2 % TD OINT
0.5000 [in_us] | TOPICAL_OINTMENT | Freq: Once | TRANSDERMAL | Status: AC
  Administered 2017-02-28: 0.5 [in_us] via TOPICAL
  Filled 2017-02-28: qty 1

## 2017-02-28 MED ORDER — LATANOPROST 0.005 % OP SOLN
1.0000 [drp] | Freq: Every day | OPHTHALMIC | Status: DC
  Administered 2017-02-28 – 2017-03-03 (×4): 1 [drp] via OPHTHALMIC
  Filled 2017-02-28: qty 2.5

## 2017-02-28 MED ORDER — ENOXAPARIN SODIUM 40 MG/0.4ML ~~LOC~~ SOLN
40.0000 mg | SUBCUTANEOUS | Status: DC
  Administered 2017-02-28 – 2017-03-02 (×3): 40 mg via SUBCUTANEOUS
  Filled 2017-02-28 (×2): qty 0.4

## 2017-02-28 MED ORDER — ASPIRIN 300 MG RE SUPP
300.0000 mg | Freq: Once | RECTAL | Status: AC
  Administered 2017-02-28: 300 mg via RECTAL

## 2017-02-28 MED ORDER — ACETAMINOPHEN 650 MG RE SUPP: 650.0000 mg | Freq: Four times a day (QID) | RECTAL | Status: DC | PRN

## 2017-02-28 MED ORDER — CEFTRIAXONE SODIUM IN DEXTROSE 20 MG/ML IV SOLN
1.0000 g | Freq: Once | INTRAVENOUS | Status: AC
Start: 2017-02-28 — End: 2017-02-28
  Administered 2017-02-28: 1 g via INTRAVENOUS
  Filled 2017-02-28: qty 50

## 2017-02-28 MED ORDER — RISAQUAD PO CAPS
1.0000 | ORAL_CAPSULE | Freq: Two times a day (BID) | ORAL | Status: DC
  Administered 2017-02-28 – 2017-03-04 (×9): 1 via ORAL
  Filled 2017-02-28 (×10): qty 1

## 2017-02-28 MED ORDER — DOCUSATE SODIUM 100 MG PO CAPS
100.0000 mg | ORAL_CAPSULE | Freq: Two times a day (BID) | ORAL | Status: DC
  Administered 2017-02-28 – 2017-03-04 (×8): 100 mg via ORAL
  Filled 2017-02-28 (×9): qty 1

## 2017-02-28 MED ORDER — HYDROXYCHLOROQUINE SULFATE 200 MG PO TABS
200.0000 mg | ORAL_TABLET | Freq: Every day | ORAL | Status: DC
  Administered 2017-02-28 – 2017-03-04 (×4): 200 mg via ORAL
  Filled 2017-02-28 (×5): qty 1

## 2017-02-28 MED ORDER — IPRATROPIUM-ALBUTEROL 0.5-2.5 (3) MG/3ML IN SOLN
3.0000 mL | RESPIRATORY_TRACT | Status: DC
  Administered 2017-02-28 – 2017-03-01 (×7): 3 mL via RESPIRATORY_TRACT
  Filled 2017-02-28 (×6): qty 3

## 2017-02-28 MED ORDER — FUROSEMIDE 10 MG/ML IJ SOLN
20.0000 mg | Freq: Once | INTRAMUSCULAR | Status: AC
  Administered 2017-02-28: 20 mg via INTRAVENOUS
  Filled 2017-02-28: qty 4

## 2017-02-28 MED ORDER — AZITHROMYCIN 250 MG PO TABS
500.0000 mg | ORAL_TABLET | Freq: Every day | ORAL | Status: AC
  Administered 2017-03-01 – 2017-03-02 (×2): 500 mg via ORAL
  Filled 2017-02-28: qty 2
  Filled 2017-02-28: qty 1

## 2017-02-28 MED ORDER — DEXTROSE 5 % IV SOLN
500.0000 mg | Freq: Once | INTRAVENOUS | Status: AC
  Administered 2017-02-28: 500 mg via INTRAVENOUS
  Filled 2017-02-28: qty 500

## 2017-02-28 MED ORDER — DEXTROSE 5 % IV SOLN
1.0000 g | INTRAVENOUS | Status: DC
  Administered 2017-03-01 – 2017-03-04 (×4): 1 g via INTRAVENOUS
  Filled 2017-02-28 (×4): qty 10

## 2017-02-28 MED ORDER — ACETAMINOPHEN 325 MG PO TABS: 650.0000 mg | ORAL_TABLET | Freq: Four times a day (QID) | ORAL | Status: DC | PRN

## 2017-02-28 MED ORDER — OCUVITE-LUTEIN PO TABS
1.0000 | ORAL_TABLET | Freq: Every evening | ORAL | Status: DC
  Filled 2017-02-28: qty 1

## 2017-02-28 MED ORDER — BUDESONIDE 3 MG PO CPEP
3.0000 mg | ORAL_CAPSULE | Freq: Every day | ORAL | Status: DC
  Administered 2017-02-28 – 2017-03-04 (×5): 3 mg via ORAL
  Filled 2017-02-28 (×5): qty 1

## 2017-02-28 MED ORDER — OCUVITE-LUTEIN PO CAPS
1.0000 | ORAL_CAPSULE | Freq: Every evening | ORAL | Status: DC
  Administered 2017-02-28 – 2017-03-04 (×5): 1 via ORAL
  Filled 2017-02-28 (×5): qty 1

## 2017-02-28 MED ORDER — ASPIRIN 300 MG RE SUPP
RECTAL | Status: AC
  Administered 2017-02-28: 300 mg via RECTAL
  Filled 2017-02-28: qty 1

## 2017-02-28 MED ORDER — ENALAPRIL MALEATE 10 MG PO TABS
20.0000 mg | ORAL_TABLET | Freq: Two times a day (BID) | ORAL | Status: DC
  Administered 2017-02-28 – 2017-03-02 (×4): 20 mg via ORAL
  Filled 2017-02-28: qty 2
  Filled 2017-02-28: qty 1
  Filled 2017-02-28 (×4): qty 2

## 2017-02-28 MED ORDER — ASPIRIN 81 MG PO CHEW: 324.0000 mg | CHEWABLE_TABLET | Freq: Once | ORAL | Status: DC

## 2017-02-28 MED ORDER — AMLODIPINE BESYLATE 5 MG PO TABS
5.0000 mg | ORAL_TABLET | Freq: Every day | ORAL | Status: DC
  Administered 2017-02-28 – 2017-03-04 (×5): 5 mg via ORAL
  Filled 2017-02-28 (×5): qty 1

## 2017-02-28 MED ORDER — METHYLPREDNISOLONE SODIUM SUCC 40 MG IJ SOLR
20.0000 mg | Freq: Two times a day (BID) | INTRAMUSCULAR | Status: DC
  Administered 2017-02-28 – 2017-03-04 (×9): 20 mg via INTRAVENOUS
  Filled 2017-02-28 (×9): qty 1

## 2017-02-28 MED ORDER — INSULIN ASPART 100 UNIT/ML ~~LOC~~ SOLN
0.0000 [IU] | SUBCUTANEOUS | Status: DC
  Administered 2017-02-28: 5 [IU] via SUBCUTANEOUS
  Administered 2017-02-28: 1 [IU] via SUBCUTANEOUS
  Administered 2017-02-28: 5 [IU] via SUBCUTANEOUS
  Administered 2017-02-28: 3 [IU] via SUBCUTANEOUS
  Administered 2017-02-28: 2 [IU] via SUBCUTANEOUS
  Administered 2017-03-01: 5 [IU] via SUBCUTANEOUS
  Administered 2017-03-01 (×2): 2 [IU] via SUBCUTANEOUS
  Filled 2017-02-28 (×8): qty 1

## 2017-02-28 MED ORDER — CARVEDILOL 3.125 MG PO TABS
3.1250 mg | ORAL_TABLET | Freq: Two times a day (BID) | ORAL | Status: DC
  Administered 2017-02-28 – 2017-03-04 (×9): 3.125 mg via ORAL
  Filled 2017-02-28 (×9): qty 1

## 2017-02-28 MED ORDER — CALCIUM CARBONATE-VITAMIN D 500-200 MG-UNIT PO TABS
1.0000 | ORAL_TABLET | Freq: Two times a day (BID) | ORAL | Status: DC
  Administered 2017-02-28 – 2017-03-03 (×8): 1 via ORAL
  Filled 2017-02-28 (×11): qty 1

## 2017-02-28 MED ORDER — ONDANSETRON HCL 4 MG PO TABS: 4.0000 mg | ORAL_TABLET | Freq: Four times a day (QID) | ORAL | Status: DC | PRN

## 2017-02-28 MED ORDER — IPRATROPIUM-ALBUTEROL 0.5-2.5 (3) MG/3ML IN SOLN
RESPIRATORY_TRACT | Status: AC
  Filled 2017-02-28: qty 3

## 2017-02-28 MED ORDER — DORZOLAMIDE HCL 2 % OP SOLN
1.0000 [drp] | Freq: Two times a day (BID) | OPHTHALMIC | Status: DC
  Administered 2017-02-28 – 2017-03-04 (×9): 1 [drp] via OPHTHALMIC
  Filled 2017-02-28: qty 10

## 2017-02-28 NOTE — ED Notes (Signed)
Pt moved in bed, external catheter moved and pt needed peri care and linen change, this was completed by this RN, pt placed in diaper due to pt being admitted within the hr.

## 2017-02-28 NOTE — Progress Notes (Signed)
[name], female   DOB: October 23, 1930, 84 y.o.   MRN: [id number]  ACP Note  Patient states that she is been very sick lately.  Was admitted with acute respiratory failure and placed on BiPAP.  She has a history of diastolic congestive heart failure with severe aortic stenosis.  History of renal cell carcinoma and breast cancer.  Patient made herself a DNR.  She states that if she met the Lord on her birthday that would be fine with her.  Time spent on ACP discussion 25 minutes  Dr. Loletha Grayer

## 2017-02-28 NOTE — Progress Notes (Signed)
Good day. Lungs progressively clearer through out day. Transitioned from BiPAP to 3 liters nasal canula. Remains in NSR on the cardiac monitor. Eating well in small amounts. Remains alert and oriented. Plan- move out to the floor tomorrow.

## 2017-02-28 NOTE — Progress Notes (Signed)
   02/28/17 0945  Clinical Encounter Type  Visited With Patient  Visit Type Initial  Spiritual Encounters  Spiritual Needs Prayer;Emotional  Stress Factors  Patient Stress Factors Health changes;Family relationships   Chaplain introduced self and chaplain availability.  Patient shared thoughts regarding her spiritual beliefs and her trust in God through this current hospitalization.  Patient spoke of her two sons and hopes and cares for them.  Patient stated that she has been a widow for 25 years and that today is her birthday.  Patient requested prayer; chaplain facilitated conversation surrounding how to shape prayer; chaplain offered prayer.  Patient open to ongoing contact with a chaplain.

## 2017-02-28 NOTE — Progress Notes (Signed)
IPAP up to 16 & FIO2 to 40% after ABG. Pt tolerating well. O2 sat 98%.

## 2017-02-28 NOTE — ED Provider Notes (Signed)
[facility] Emergency Department Provider Note   ____________________________________________   First MD Initiated Contact with Patient 02/28/17 [phone]     (approximate)  I have reviewed the triage vital signs and the nursing notes.   HISTORY  Chief Complaint Shortness of Breath  Level 5 caveat: History limited by acute respiratory distress  HPI Margaret Matthews is a 84 y.o. female brought to the ED via EMS from home with a chief complaint of shortness of breath.  Patient reports shortness of breath that began 2 hours ago.  Denies history of COPD or CHF; not currently on diuretics.  Arrives to the ED on CPAP with saturations 88%, blood pressure 209/94.  Denies recent fever, chills, cough, chest pain, abdominal pain, nausea or vomiting.  Denies recent travel or trauma.   Past Medical History:  Diagnosis Date  . Anemia   . Arthritis   . Breast cancer ([place])   . CKD (chronic kidney disease) stage 2, GFR 60-89 ml/min    baseline creatinine 1.5  . GERD (gastroesophageal reflux disease)   . Glaucoma   . Hypertension   . Hyponatremia   . Osteopenia   . PUD (peptic ulcer disease)   . Renal cell carcinoma [facility])     Patient Active Problem List   Diagnosis Date Noted  . HTN (hypertension) 06/15/2015  . Anxiety 06/15/2015  . Hyponatremia with decreased serum osmolality 05/30/2015  . CHF (congestive heart failure) ([place]) 05/08/2015    Past Surgical History:  Procedure Laterality Date  . BREAST SURGERY     Left mastectomy  . COLONOSCOPY    . HEMIARTHROPLASTY SHOULDER FRACTURE     Right shoulder  . HIP SURGERY     Left hip  . NEPHRECTOMY     Right nephrectomy    Prior to Admission medications   Medication Sig Start Date End Date Taking? Authorizing Provider  acidophilus (RISAQUAD) CAPS capsule Take 1 capsule by mouth 2 (two) times daily. 07/23/14   Gladstone Lighter, MD  amLODipine (NORVASC) 5 MG tablet Take 1 tablet by mouth daily. 04/13/15    [provider]  aspirin EC 81 MG tablet Take 81 mg by mouth daily.    [provider]  beta carotene w/minerals (OCUVITE) tablet Take 1 tablet by mouth every evening.    [provider]  budesonide (ENTOCORT EC) 3 MG 24 hr capsule Take 3 mg by mouth daily. 3 dail2 weeks 2daily after    [provider]  Calcium Carbonate-Vit D-Min (CALCIUM 600+D PLUS MINERALS PO) Take 1 tablet by mouth 2 (two) times daily.     [provider]  carvedilol (COREG) 3.125 MG tablet Take 1 tablet (3.125 mg total) by mouth 2 (two) times daily with a meal. 05/12/15   Fritzi Mandes, MD  CRANBERRY PO Take 1 capsule by mouth every evening.    [provider]  dorzolamide (TRUSOPT) 2 % ophthalmic solution Place 1 drop into both eyes daily. Patient taking differently: Place 1 drop into both eyes 2 (two) times daily.  05/12/15   Fritzi Mandes, MD  enalapril (VASOTEC) 5 MG tablet Take 1 tablet (5 mg total) by mouth 2 (two) times daily. Patient taking differently: Take 20 mg by mouth 2 (two) times daily.  07/23/14   Gladstone Lighter, MD  hydroxychloroquine (PLAQUENIL) 200 MG tablet Take 200 mg by mouth daily.    [provider]  IRON-VITAMIN C PO Take 1 tablet by mouth every evening.    [provider]  latanoprost (XALATAN) 0.005 % ophthalmic solution Place 1 drop into both eyes at bedtime. 05/12/15   Fritzi Mandes, MD  Loperamide HCl (IMODIUM PO) Take 1 tablet by mouth daily as needed (for diarrhea).     [provider]  oxyCODONE-acetaminophen (ROXICET) 5-325 MG tablet Take 1 tablet by mouth every 6 (six) hours as needed. 04/06/16   [name], MD  pantoprazole (PROTONIX) 40 MG tablet Take 1 tablet by mouth daily. 02/17/15   [provider]  pravastatin (PRAVACHOL) 20 MG tablet Take 20 mg by mouth every evening.    [provider]    Allergies Statins  Family History  Problem Relation Age of Onset  . CAD Mother   . CVA  Father   . CAD Sister     Social History Social History   Tobacco Use  . Smoking status: Former Research scientist (life sciences)  . Smokeless tobacco: Never Used  Substance Use Topics  . Alcohol use: No    Alcohol/week: 0.0 oz  . Drug use: No    Review of Systems  Constitutional: No fever/chills. Eyes: No visual changes. ENT: No sore throat. Cardiovascular: Denies chest pain. Respiratory: Positive for shortness of breath. Gastrointestinal: No abdominal pain.  No nausea, no vomiting.  No diarrhea.  No constipation. Genitourinary: Negative for dysuria. Musculoskeletal: Negative for back pain. Skin: Negative for rash. Neurological: Negative for headaches, focal weakness or numbness.   ____________________________________________   PHYSICAL EXAM:  VITAL SIGNS: ED Triage Vitals  Enc Vitals Group     BP      Pulse      Resp      Temp      Temp src      SpO2      Weight      Height      Head Circumference      Peak Flow      Pain Score      Pain Loc      Pain Edu?      Excl. in [place]?     Constitutional: Alert and oriented.  Frail appearing and in moderate acute distress. Eyes: Conjunctivae are normal. PERRL. EOMI. Head: Atraumatic. Nose: No congestion/rhinnorhea. Mouth/Throat: Mucous membranes are moist.  Oropharynx non-erythematous. Neck: No stridor.  Supple neck. Cardiovascular: Tachycardic rate, regular rhythm. Grossly normal heart sounds.  Good peripheral circulation. Respiratory: Increased respiratory effort.  No retractions. Lungs with diffuse rales. Gastrointestinal: Soft and nontender. No distention. No abdominal bruits. No CVA tenderness. Musculoskeletal: No lower extremity tenderness nor edema.  No joint effusions. Neurologic:  Normal speech and language. No gross focal neurologic deficits are appreciated. MAEx4. Skin:  Skin is warm, dry and intact. No rash noted. Psychiatric: Mood and affect are normal. Speech and behavior are  normal.  ____________________________________________   LABS (all labs ordered are listed, but only abnormal results are displayed)  Labs Reviewed  CBC WITH DIFFERENTIAL/PLATELET - Abnormal; Notable for the following components:      Result Value   WBC 14.8 (*)    RDW 15.1 (*)    Neutro Abs 11.7 (*)    Monocytes Absolute 1.1 (*)    Basophils Absolute 0.2 (*)    All other components within normal limits  COMPREHENSIVE METABOLIC PANEL - Abnormal; Notable for the following components:   Glucose, Bld 336 (*)    BUN 23 (*)    ALT 13 (*)    All other components within normal limits  TROPONIN I - Abnormal; Notable for the following components:  Troponin I 0.03 (*)    All other components within normal limits  BRAIN NATRIURETIC PEPTIDE - Abnormal; Notable for the following components:   B Natriuretic Peptide 1,073.0 (*)    All other components within normal limits  BLOOD GAS, ARTERIAL - Abnormal; Notable for the following components:   pH, Arterial 7.25 (*)    pCO2 arterial 61 (*)    pO2, Arterial 322 (*)    All other components within normal limits  CULTURE, BLOOD (ROUTINE X 2)  CULTURE, BLOOD (ROUTINE X 2)  LACTIC ACID, PLASMA  LACTIC ACID, PLASMA   ____________________________________________  EKG  ED ECG REPORT I, SUNG,JADE J, the attending physician, personally viewed and interpreted this ECG.   Date: 02/28/2017  EKG Time: 0425  Rate: 102  Rhythm: sinus tachycardia  Axis: Normal  Intervals:none  ST&T Change: ST depression inferior lateral leads  ____________________________________________  RADIOLOGY  Dg Chest Port 1 View  Result Date: 02/28/2017 CLINICAL DATA:  Shortness of breath beginning 2 hours ago. EXAM: PORTABLE CHEST 1 VIEW COMPARISON:  05/30/2015 FINDINGS: Diffuse perihilar airspace and interstitial disease likely representing edema. Multifocal pneumonia could also have this appearance. Small bilateral pleural effusions. Cardiac enlargement. No  pneumothorax. Calcification of the aorta. Old fracture deformity of the left proximal humerus. IMPRESSION: Perihilar infiltrates and small pleural effusions. Likely representing edema but multifocal pneumonia could also have this appearance. Electronically Signed   By: Lucienne Capers M.D.   On: 02/28/2017 04:50    ____________________________________________   PROCEDURES  Procedure(s) performed: None  Procedures  Critical Care performed: Yes, see critical care note(s)   CRITICAL CARE Performed by: Paulette Blanch   Total critical care time: 45 minutes  Critical care time was exclusive of separately billable procedures and treating other patients.  Critical care was necessary to treat or prevent imminent or life-threatening deterioration.  Critical care was time spent personally by me on the following activities: development of treatment plan with patient and/or surrogate as well as nursing, discussions with consultants, evaluation of patient's response to treatment, examination of patient, obtaining history from patient or surrogate, ordering and performing treatments and interventions, ordering and review of laboratory studies, ordering and review of radiographic studies, pulse oximetry and re-evaluation of patient's condition.  ____________________________________________   INITIAL IMPRESSION / ASSESSMENT AND PLAN / ED COURSE  As part of my medical decision making, I reviewed the following data within the [place] notes reviewed and incorporated, Labs reviewed, EKG interpreted, Old chart reviewed, Radiograph reviewed, Discussed with admitting physician Dr. Marcille Blanco and Notes from prior ED visits.   84 year old female who presents with acute shortness of breath; diffuse crackles on exam. Differential includes, but is not limited to, viral syndrome, bronchitis including COPD exacerbation, pneumonia, reactive airway disease including asthma, CHF including  exacerbation with or without pulmonary/interstitial edema, pneumothorax, ACS, thoracic trauma, and pulmonary embolism.  The patient denies a history of CHF I see this is listed in her chart.  Looks like she takes Coreg daily.  Chest x-ray personally reviewed by me and consistent with pulmonary edema.  Patient looking more comfortable on BiPAP.  Will add Lasix, aspirin, nitroglycerin paste.  Anticipate hospitalization.  Clinical Course as of Feb 28 509  Thu Feb 28, 2017  0510 Noted radiology interpretation of patient's chest x-ray.  Clinically patient is more consistent with pulmonary edema.  Will obtain blood cultures and give 1 dose IV Rocephin and IV azithromycin for possible pneumonia.  [JS]    Clinical Course User  Index [JS] Paulette Blanch, MD     ____________________________________________   FINAL CLINICAL IMPRESSION(S) / ED DIAGNOSES  Final diagnoses:  Hypoxia  SOB (shortness of breath)  Acute pulmonary edema (HCC)  Acute on chronic congestive heart failure, unspecified heart failure type [facility])     ED Discharge Orders    None       Note:  This document was prepared using Dragon voice recognition software and may include unintentional dictation errors.    Paulette Blanch, MD 02/28/17 [phone]

## 2017-02-28 NOTE — Consult Note (Signed)
   CHIEF COMPLAINT:   Chief Complaint  Patient presents with  . Shortness of Breath    Subjective  84 year old female with hypertension, history of renal cell carcinoma and breast cancer, as well as chronic kidney disease who presents to the emergency department with shortness of breath.  -The patient states that she awoke with difficulty breathing.   -She admits that her chest feels heavy but denies pain.  She also denies nausea, vomiting or diaphoresis.    Chest x-ray showed pulmonary edema and the patient's work of breathing required noninvasive ventilation.  The patient was placed on BiPAP and given Lasix 20 mg IV.  Oxygen saturations were in the mid 80s.  Patient alert and awake, will wean off biPAP      Objective   Examination:  General exam: +resp distress  Respiratory system: Clear to auscultation. Respiratory effort normal. HEENT: [place]/AT, PERRLA, no thrush, no stridor. Cardiovascular system: S1 & S2 heard, RRR. No JVD, murmurs, rubs, gallops or clicks. No pedal edema. Gastrointestinal system: Abdomen is nondistended, soft and nontender. No organomegaly or masses felt. Normal bowel sounds heard. Central nervous system: Alert and oriented. No focal neurological deficits. Extremities: Symmetric 5 x 5 power. Skin: No rashes, lesions or ulcers Psychiatry: Judgement and insight appear normal. Mood & affect appropriate.   VITALS:  height is 5\' 2"  (1.575 m) and weight is 108 lb 14.5 oz (49.4 kg). Her oral temperature is 96.4 F (35.8 C) (abnormal). Her blood pressure is 183/93 (abnormal) and her pulse is 94. Her respiration is 38 (abnormal) and oxygen saturation is 96%.   I personally reviewed Labs under Results section.  Radiology Reports Dg Chest Port 1 View  Result Date: 02/28/2017 CLINICAL DATA:  Shortness of breath beginning 2 hours ago. EXAM: PORTABLE CHEST 1 VIEW COMPARISON:  05/30/2015 FINDINGS: Diffuse perihilar airspace and interstitial disease likely representing  edema. Multifocal pneumonia could also have this appearance. Small bilateral pleural effusions. Cardiac enlargement. No pneumothorax. Calcification of the aorta. Old fracture deformity of the left proximal humerus. IMPRESSION: Perihilar infiltrates and small pleural effusions. Likely representing edema but multifocal pneumonia could also have this appearance. Electronically Signed   By: Lucienne Capers M.D.   On: 02/28/2017 04:50       Assessment/Plan:   84 year old female admitted for CHF exacerbation. 1.  CHF: Acute; systolic.  Echocardiogram from 2 years ago shows preserved ejection fraction but severe aortic stenosis.  I have reordered an echocardiogram to evaluate cardiac output.  Continue to diurese with IV Lasix.  Continue Coreg. 2.  Respiratory failure: Acute; with hypoxia.  Continue BiPAP until work of breathing has improved.  Supplemental oxygen as needed. 3.  Hypertension: Uncontrolled; continue amlodipine, enalapril and hydralazine.  Nitroglycerin paste as needed.  Consider labetalol as needed 4.  GERD: Continue pantoprazole per home regimen 5.  DVT prophylaxis: Heparin 6.  GI prophylaxis: As above The patient is a full code.      Code Status: Full   Disposition Plan: palliative care consulted    Kariss Longmire Patricia Pesa, M.D.  [facility] Pulmonary & Critical Care Medicine  Medical Director [place] Director [facility] Cardio-Pulmonary Department

## 2017-02-28 NOTE — ED Notes (Signed)
External catheter placed on pt, pt urinated, 119ml noted in drainage device.

## 2017-02-28 NOTE — Progress Notes (Signed)
PT Cancellation Note  Patient Details Name: Margaret Matthews MRN: [id number] DOB: [birth year]   Cancelled Treatment:    Reason Eval/Treat Not Completed: Patient declined, no reason specified.  PT consult received.  Chart reviewed.  Per chart pt was on Bipap early this morning but now transitioned to nasal cannula.  Pt reports stomach pain after eating lunch (pt reporting pain improving though) and also having to urinate every 7-8 minutes d/t medication (pt reporting not wanting to get OOB d/t frequent urination).  Discussed options for out of bed mobility (in regards to frequent urination concerns) but pt continued to refuse stating it was just a "bad time" for therapy.  Nursing notified of pt's above statements/concerns.  Will re-attempt PT evaluation tomorrow.  Leitha Bleak, PT 02/28/17, 1:35 PM [phone]

## 2017-02-28 NOTE — ED Triage Notes (Signed)
Per EMS, pt from home with complaints of SHOB that began 2 hrs ago, denies hx of COPD or CHF. Per EMS, pt stated this happened 2 yrs ago and Dr told pt it was from mitral valve leak, pt not currently on diuretics. EMS states pt's saturation was 87% on RA on their arrival to pt. Pt placed on cpap and continues to be on cpap with saturation 90%.  EMS states BP 209/94. Crackles noted on auscultation. EDP in rm.

## 2017-02-28 NOTE — ED Notes (Signed)
Pt appears to be tolerating Bipap well at this time

## 2017-02-28 NOTE — Progress Notes (Signed)
Saturations are 100% on 40% bipap, decreased to 30%, will continue to monitor.

## 2017-02-28 NOTE — H&P (Signed)
Margaret Matthews is an 84 y.o. female.   Chief Complaint: Shortness of breath HPI: This is an 84 year old female with hypertension, history of renal cell carcinoma and breast cancer, as well as chronic kidney disease who presents to the emergency department with shortness of breath.  The patient states that she awoke with difficulty breathing.  She admits that her chest feels heavy but denies pain.  She also denies nausea, vomiting or diaphoresis.  Chest x-ray showed pulmonary edema and the patient's work of breathing required noninvasive ventilation.  The patient was placed on BiPAP and given Lasix 20 mg IV.  Oxygen saturations were in the mid 80s.  Patient's work of breathing marginally improved yet her blood pressure remained elevated which prompted the emergency department staff to call the hospitalist service for admission.  Past Medical History:  Diagnosis Date  . Anemia   . Arthritis   . Breast cancer ([place])   . CKD (chronic kidney disease) stage 2, GFR 60-89 ml/min    baseline creatinine 1.5  . GERD (gastroesophageal reflux disease)   . Glaucoma   . Hypertension   . Hyponatremia   . Osteopenia   . PUD (peptic ulcer disease)   . Renal cell carcinoma [facility])     Past Surgical History:  Procedure Laterality Date  . BREAST SURGERY     Left mastectomy  . COLONOSCOPY    . HEMIARTHROPLASTY SHOULDER FRACTURE     Right shoulder  . HIP SURGERY     Left hip  . NEPHRECTOMY     Right nephrectomy    Family History  Problem Relation Age of Onset  . CAD Mother   . CVA Father   . CAD Sister    Social History:  reports that she has quit smoking. she has never used smokeless tobacco. She reports that she does not drink alcohol or use drugs.  Allergies:  Allergies  Allergen Reactions  . Ezetimibe Other (See Comments)    ELEV LFTS  . Fenofibrate Micronized Other (See Comments)    ELEV LFTS  . Statins Other (See Comments)    Reaction: Pt says "it was showing up in her liver."   .  Sulfamethoxazole-Trimethoprim Diarrhea    Prior to Admission medications   Medication Sig Start Date End Date Taking? Authorizing Provider  amLODipine (NORVASC) 5 MG tablet Take 1 tablet by mouth daily. 04/13/15  Yes [provider]  aspirin EC 81 MG tablet Take 81 mg by mouth daily.   Yes [provider]  beta carotene w/minerals (OCUVITE) tablet Take 1 tablet by mouth every evening.   Yes [provider]  Calcium Carbonate-Vit D-Min (CALCIUM 600+D PLUS MINERALS PO) Take 1 tablet by mouth 2 (two) times daily.    Yes [provider]  carvedilol (COREG) 3.125 MG tablet Take 1 tablet (3.125 mg total) by mouth 2 (two) times daily with a meal. 05/12/15  Yes Fritzi Mandes, MD  CRANBERRY PO Take 1 capsule by mouth every evening.   Yes [provider]  enalapril (VASOTEC) 5 MG tablet Take 1 tablet (5 mg total) by mouth 2 (two) times daily. Patient taking differently: Take 20 mg by mouth 2 (two) times daily.  07/23/14  Yes Gladstone Lighter, MD  hydroxychloroquine (PLAQUENIL) 200 MG tablet Take 200 mg by mouth daily.   Yes [provider]  IRON-VITAMIN C PO Take 1 tablet by mouth every evening.   Yes [provider]  latanoprost (XALATAN) 0.005 % ophthalmic solution Place 1 drop  into both eyes at bedtime. 05/12/15  Yes Fritzi Mandes, MD  Loperamide HCl (IMODIUM PO) Take 1 tablet by mouth daily as needed (for diarrhea).    Yes [provider]  pantoprazole (PROTONIX) 40 MG tablet Take 1 tablet by mouth daily. 02/17/15  Yes [provider]  pravastatin (PRAVACHOL) 20 MG tablet Take 20 mg by mouth every evening.   Yes [provider]  acidophilus (RISAQUAD) CAPS capsule Take 1 capsule by mouth 2 (two) times daily. Patient not taking: Reported on 02/28/2017 07/23/14   Gladstone Lighter, MD  budesonide (ENTOCORT EC) 3 MG 24 hr capsule Take 3 mg by mouth daily. 3 dail2 weeks 2daily after    [provider]  dorzolamide  (TRUSOPT) 2 % ophthalmic solution Place 1 drop into both eyes daily. Patient not taking: Reported on 02/28/2017 05/12/15   Fritzi Mandes, MD  oxyCODONE-acetaminophen (ROXICET) 5-325 MG tablet Take 1 tablet by mouth every 6 (six) hours as needed. Patient not taking: Reported on 02/28/2017 04/06/16   [name], MD     Results for orders placed or performed during the hospital encounter of 02/28/17 (from the past 48 hour(s))  CBC with Differential     Status: Abnormal   Collection Time: 02/28/17  4:26 AM  Result Value Ref Range   WBC 14.8 (H) 3.6 - 11.0 K/uL   RBC 4.24 3.80 - 5.20 MIL/uL   Hemoglobin 12.5 12.0 - 16.0 g/dL   HCT 38.2 35.0 - 47.0 %   MCV 90.0 80.0 - 100.0 fL   MCH 29.6 26.0 - 34.0 pg   MCHC 32.9 32.0 - 36.0 g/dL   RDW 15.1 (H) 11.5 - 14.5 %   Platelets 280 150 - 440 K/uL   Neutrophils Relative % 79 %   Neutro Abs 11.7 (H) 1.4 - 6.5 K/uL   Lymphocytes Relative 12 %   Lymphs Abs 1.7 1.0 - 3.6 K/uL   Monocytes Relative 7 %   Monocytes Absolute 1.1 (H) 0.2 - 0.9 K/uL   Eosinophils Relative 1 %   Eosinophils Absolute 0.1 0 - 0.7 K/uL   Basophils Relative 1 %   Basophils Absolute 0.2 (H) 0 - 0.1 K/uL    Comment: Performed at [facility], [address] [place], [place] [address]  Comprehensive metabolic panel     Status: Abnormal   Collection Time: 02/28/17  4:26 AM  Result Value Ref Range   Sodium 138 135 - 145 mmol/L   Potassium 4.0 3.5 - 5.1 mmol/L   Chloride 103 101 - 111 mmol/L   CO2 25 22 - 32 mmol/L   Glucose, Bld 336 (H) 65 - 99 mg/dL   BUN 23 (H) 6 - 20 mg/dL   Creatinine, Ser 0.80 0.44 - 1.00 mg/dL   Calcium 9.4 8.9 - 10.3 mg/dL   Total Protein 6.9 6.5 - 8.1 g/dL   Albumin 3.8 3.5 - 5.0 g/dL   AST 24 15 - 41 U/L   ALT 13 (L) 14 - 54 U/L   Alkaline Phosphatase 82 38 - 126 U/L   Total Bilirubin 0.6 0.3 - 1.2 mg/dL   GFR calc non Af Amer >60 >60 mL/min   GFR calc Af Amer >60 >60 mL/min    Comment: (NOTE) The eGFR has been calculated  using the CKD EPI equation. This calculation has not been validated in all clinical situations. eGFR's persistently <60 mL/min signify possible Chronic Kidney Disease.    Anion gap 10 5 - 15  Comment: Performed at [facility], [address] [place], [place] [address]  Troponin I     Status: Abnormal   Collection Time: 02/28/17  4:26 AM  Result Value Ref Range   Troponin I 0.03 (HH) <0.03 ng/mL    Comment: CRITICAL RESULT CALLED TO, READ BACK BY AND VERIFIED WITH KASEY ROBERT ON 02/28/17 AT 0504 JAG Performed at [facility], [address] [place], [place] [address]   Brain natriuretic peptide     Status: Abnormal   Collection Time: 02/28/17  4:26 AM  Result Value Ref Range   B Natriuretic Peptide 1,073.0 (H) 0.0 - 100.0 pg/mL    Comment: Performed at [facility], [address] [place], [place] [address]  Blood gas, arterial     Status: Abnormal   Collection Time: 02/28/17  4:26 AM  Result Value Ref Range   FIO2 1.00    pH, Arterial 7.25 (L) 7.350 - 7.450   pCO2 arterial 61 (H) 32.0 - 48.0 mmHg   pO2, Arterial 322 (H) 83.0 - 108.0 mmHg   Bicarbonate 26.8 20.0 - 28.0 mmol/L   Acid-base deficit 1.7 0.0 - 2.0 mmol/L   O2 Saturation 99.9 %   Patient temperature 37.0    Collection site RIGHT RADIAL    Sample type ARTERIAL DRAW    Allens test (pass/fail) PASS PASS   Mechanical Rate 8     Comment: Performed at [facility], [address] [place], [place] [address]  Culture, blood (routine x 2)     Status: None (Preliminary result)   Collection Time: 02/28/17  5:10 AM  Result Value Ref Range   Specimen Description BLOOD RIGHT FA    Special Requests      BOTTLES DRAWN AEROBIC AND ANAEROBIC Blood Culture adequate volume   Culture      NO GROWTH <12 HOURS Performed at [facility], [address], [place], [place] [address]    Report Status PENDING   Culture, blood (routine x 2)     Status: None (Preliminary result)    Collection Time: 02/28/17  5:10 AM  Result Value Ref Range   Specimen Description BLOOD LEFT FA    Special Requests      BOTTLES DRAWN AEROBIC AND ANAEROBIC Blood Culture adequate volume   Culture      NO GROWTH <12 HOURS Performed at [facility], [address] [place], [place] [address]    Report Status PENDING   Lactic acid, plasma     Status: None   Collection Time: 02/28/17  5:10 AM  Result Value Ref Range   Lactic Acid, Venous 1.1 0.5 - 1.9 mmol/L    Comment: Performed at [facility], [address], [place], [place] [address]   Dg Chest Port 1 View  Result Date: 02/28/2017 CLINICAL DATA:  Shortness of breath beginning 2 hours ago. EXAM: PORTABLE CHEST 1 VIEW COMPARISON:  05/30/2015 FINDINGS: Diffuse perihilar airspace and interstitial disease likely representing edema. Multifocal pneumonia could also have this appearance. Small bilateral pleural effusions. Cardiac enlargement. No pneumothorax. Calcification of the aorta. Old fracture deformity of the left proximal humerus. IMPRESSION: Perihilar infiltrates and small pleural effusions. Likely representing edema but multifocal pneumonia could also have this appearance. Electronically Signed   By: Lucienne Capers M.D.   On: 02/28/2017 04:50    Review of Systems  Constitutional: Negative for chills and fever.  HENT: Negative for sore throat and tinnitus.   Eyes: Negative for blurred vision  and redness.  Respiratory: Positive for shortness of breath. Negative for cough.   Cardiovascular: Negative for chest pain, palpitations, orthopnea and PND.  Gastrointestinal: Negative for abdominal pain, diarrhea, nausea and vomiting.  Genitourinary: Negative for dysuria, frequency and urgency.  Musculoskeletal: Negative for joint pain and myalgias.  Skin: Negative for rash.       No lesions  Neurological: Negative for speech change, focal weakness and weakness.  Endo/Heme/Allergies: Does not bruise/bleed easily.        No temperature intolerance  Psychiatric/Behavioral: Negative for depression and suicidal ideas.    Blood pressure (!) 157/84, pulse 88, temperature 98.8 F (37.1 C), temperature source Rectal, resp. rate (!) 22, height 5' 5"  (1.651 m), weight 47.6 kg (105 lb), SpO2 99 %. Physical Exam  Nursing note and vitals reviewed. Constitutional: She is oriented to person, place, and time. She appears well-developed and well-nourished. No distress.  HENT:  Head: Normocephalic and atraumatic.  Mouth/Throat: Oropharynx is clear and moist.  Eyes: Conjunctivae and EOM are normal. Pupils are equal, round, and reactive to light. No scleral icterus.  Neck: Normal range of motion. Neck supple. No JVD present. No tracheal deviation present. No thyromegaly present.  Cardiovascular: Normal rate, regular rhythm and normal heart sounds. Exam reveals no gallop and no friction rub.  No murmur heard. Respiratory: Effort normal and breath sounds normal. No respiratory distress.  GI: Soft. Bowel sounds are normal. She exhibits no distension. There is no tenderness.  Genitourinary:  Genitourinary Comments: Deferred  Musculoskeletal: Normal range of motion. She exhibits no edema.  Lymphadenopathy:    She has no cervical adenopathy.  Neurological: She is alert and oriented to person, place, and time. No cranial nerve deficit. She exhibits normal muscle tone.  Skin: Skin is warm and dry. No rash noted. No erythema.  Psychiatric: She has a normal mood and affect. Her behavior is normal. Judgment and thought content normal.     Assessment/Plan This is a 84 year old female admitted for CHF exacerbation. 1.  CHF: Acute; systolic.  Echocardiogram from 2 years ago shows preserved ejection fraction but severe aortic stenosis.  I have reordered an echocardiogram to evaluate cardiac output.  Continue to diurese with IV Lasix.  Continue Coreg. 2.  Respiratory failure: Acute; with hypoxia.  Continue BiPAP until work of breathing  has improved.  Supplemental oxygen as needed. 3.  Hypertension: Uncontrolled; continue amlodipine, enalapril and hydralazine.  Nitroglycerin paste as needed.  Consider labetalol as needed 4.  GERD: Continue pantoprazole per home regimen 5.  DVT prophylaxis: Heparin 6.  GI prophylaxis: As above The patient is a full code.  Time spent on admission orders and critical care approximately 45 minutes discussed with ICU attending.  Harrie Foreman, MD 02/28/2017, 7:20 AM

## 2017-02-28 NOTE — Progress Notes (Signed)
Rt assisted with patient transport from ER to icu, while on V60 BiPap, with no complications.

## 2017-03-01 LAB — GLUCOSE, CAPILLARY
Glucose-Capillary: 164 mg/dL — ABNORMAL HIGH (ref 65–99)
Glucose-Capillary: 115 mg/dL — ABNORMAL HIGH (ref 65–99)
Glucose-Capillary: 268 mg/dL — ABNORMAL HIGH (ref 65–99)
Glucose-Capillary: 191 mg/dL — ABNORMAL HIGH (ref 65–99)
Glucose-Capillary: 153 mg/dL — ABNORMAL HIGH (ref 65–99)

## 2017-03-01 MED ORDER — IPRATROPIUM-ALBUTEROL 0.5-2.5 (3) MG/3ML IN SOLN
3.0000 mL | Freq: Four times a day (QID) | RESPIRATORY_TRACT | Status: DC
  Administered 2017-03-01 – 2017-03-02 (×4): 3 mL via RESPIRATORY_TRACT
  Filled 2017-03-01 (×4): qty 3

## 2017-03-01 MED ORDER — INSULIN ASPART 100 UNIT/ML ~~LOC~~ SOLN: 0.0000 [IU] | Freq: Every day | SUBCUTANEOUS | Status: DC

## 2017-03-01 MED ORDER — INSULIN ASPART 100 UNIT/ML ~~LOC~~ SOLN
0.0000 [IU] | Freq: Three times a day (TID) | SUBCUTANEOUS | Status: DC
  Administered 2017-03-02 – 2017-03-03 (×4): 3 [IU] via SUBCUTANEOUS
  Administered 2017-03-04: 2 [IU] via SUBCUTANEOUS
  Filled 2017-03-01 (×5): qty 1

## 2017-03-01 NOTE — Care Management Note (Signed)
Case Management Note  Patient Details  Name: Margaret Matthews MRN: [id number] Date of Birth: Nov 12, 1930  Subjective/Objective:                  RNCM met with patient that was sitting independently on bedside in ICU. She is acutely wearing supplemental O2.  She states that she has two sons that lives with her however she states she "still drives".  She has requested that her appointment with Dr. Netty Starring be cancelled for today [phone]. She ambulates at baseline with a cane. She has a history of fall and fracturing her shoulder resulting in home PT/OT which has since stopped. She states she is still doing her exercises.  She has declined home health services.  Action/Plan: RNCM has reached out to [facility] to reschedule appointment to 03/08/17 11:45AM.  Expected Discharge Date:  03/03/17               Expected Discharge Plan:     In-House Referral:     Discharge planning Services  CM Consult  Post Acute Care Choice:    Choice offered to:  Patient  DME Arranged:    DME Agency:     HH Arranged:    [place] Agency:     Status of Service:  In process, will continue to follow  If discussed at Long Length of Stay Meetings, dates discussed:    Additional Comments:  Marshell Garfinkel, RN 03/01/2017, 9:42 AM

## 2017-03-01 NOTE — Evaluation (Signed)
Physical Therapy Evaluation Patient Details Name: Margaret Matthews MRN: [id number] DOB: [birth year] Today's Date: 03/01/2017   History of Present Illness  Pt is an 84 y.o. female presenting to hospital 02/28/17 with SOB and placed on Bipap initially.  Pt admitted with acute systolic CHF, htn, and acute respiratory failure.  PMH includes breast CA s/p L mastectomy, CKD, htn, R shoulder hemiarthroplasty, L hip surgery.  Clinical Impression  Prior to hospital admission, pt was modified independent ambulating with SPC.  Pt lives with her 2 son's; has steps to enter home and also to get to 2nd floor bedroom.  Currently pt is modified independent with bed mobility; CGA with transfers; and CGA ambulating 180 feet with SPC.  Minimal SOB noted during session's activities and O2 sats 94% or greater on 2 L supplemental O2 via nasal cannula during session.  Pt would benefit from skilled PT to address noted impairments and functional limitations (see below for any additional details).  Upon hospital discharge, recommend pt discharge to home with support of family and HHPT.    Follow Up Recommendations Home health PT    Equipment Recommendations  Cane(pt already owns [facility])    Recommendations for Other Services       Precautions / Restrictions Precautions Precautions: Fall Restrictions Weight Bearing Restrictions: No      Mobility  Bed Mobility Overal bed mobility: Modified Independent             General bed mobility comments: Supine to/from sit with mild increased effort.  Transfers Overall transfer level: Needs assistance Equipment used: Straight cane Transfers: Sit to/from [facility] Sit to Stand: Min guard Stand pivot transfers: Min guard(stand step turn bed to/from [facility])       General transfer comment: fairly strong stands  Ambulation/Gait Ambulation/Gait assistance: Min guard;+2 safety/equipment(2nd assist for O2 tank and lines) Ambulation Distance (Feet): 180  Feet Assistive device: Straight cane   Gait velocity: decreased (pt reports walking slower d/t feeling weaker)   General Gait Details: partial step through pattern; steady with use of SPC  Stairs            Wheelchair Mobility    Modified Rankin (Stroke Patients Only)       Balance Overall balance assessment: Needs assistance Sitting-balance support: No upper extremity supported;Feet supported Sitting balance-Leahy Scale: Normal Sitting balance - Comments: steady sitting reaching outside BOS     Standing balance-Leahy Scale: Fair Standing balance comment: steady static standing (requires single UE support if reaching within BOS though)                             Pertinent Vitals/Pain Pain Assessment: No/denies pain  HR in 90's bpm during session's activities.    Home Living Family/patient expects to be discharged to:: Private residence Living Arrangements: Children(Pt's 2 son's) Available Help at Discharge: Family Type of Home: House Home Access: Stairs to enter Entrance Stairs-Rails: Left Entrance Stairs-Number of Steps: 2 Home Layout: Two level [place] - single point Additional Comments: Bedroom on second floor    Prior Function Level of Independence: Needs assistance   Gait / Transfers Assistance Needed: Modified independent with SPC  ADL's / Homemaking Assistance Needed: Son's assist with meals, medications, errands, and driving (if weather is bad).  Pt drives self if weather is good.  Comments: Pt with 1 fall >1 year ago with L UE injury.     Hand Dominance  Extremity/Trunk Assessment   Upper Extremity Assessment Upper Extremity Assessment: Generalized weakness    Lower Extremity Assessment Lower Extremity Assessment: Generalized weakness       Communication   Communication: HOH(Hears better from L ear)  Cognition Arousal/Alertness: Awake/alert Behavior During Therapy: WFL for tasks  assessed/performed Overall Cognitive Status: Within Functional Limits for tasks assessed                                        General Comments General comments (skin integrity, edema, etc.): Pt resting in bed upon PT arrival.  Nursing cleared pt for participation in physical therapy.  Pt agreeable to PT session.    Exercises     Assessment/Plan    PT Assessment Patient needs continued PT services  PT Problem List Decreased strength;Decreased balance;Decreased mobility       PT Treatment Interventions DME instruction;Gait training;Stair training;Functional mobility training;Therapeutic activities;Therapeutic exercise;Balance training;Patient/family education    PT Goals (Current goals can be found in the Care Plan section)  Acute Rehab PT Goals Patient Stated Goal: to go home PT Goal Formulation: With patient Time For Goal Achievement: 04/12/17 Potential to Achieve Goals: Good    Frequency Min 2X/week   Barriers to discharge        Co-evaluation               AM-PAC PT "6 Clicks" Daily Activity  Outcome Measure Difficulty turning over in bed (including adjusting bedclothes, sheets and blankets)?: A Little Difficulty moving from lying on back to sitting on the side of the bed? : A Little Difficulty sitting down on and standing up from a chair with arms (e.g., wheelchair, bedside commode, etc,.)?: A Little Help needed moving to and from a bed to chair (including a wheelchair)?: A Little Help needed walking in hospital room?: A Little Help needed climbing 3-5 steps with a railing? : A Little 6 Click Score: 18    End of Session Equipment Utilized During Treatment: Gait belt;Oxygen Activity Tolerance: Patient tolerated treatment well Patient left: in bed;with call bell/phone within reach;with bed alarm set Nurse Communication: Mobility status;Precautions PT Visit Diagnosis: Other abnormalities of gait and mobility (R26.89);Muscle weakness  (generalized) (M62.81)    Time: 5974-1638 PT Time Calculation (min) (ACUTE ONLY): 32 min   Charges:   PT Evaluation $PT Eval Low Complexity: 1 Low PT Treatments $Therapeutic Activity: 8-22 mins   PT G CodesLeitha Bleak, PT 03/01/17, 1:27 PM [phone]

## 2017-03-01 NOTE — Progress Notes (Signed)
Patient is an 84 year old female admitted with dx of CHF and acute respiratory failure with hypoxia possibly due to underlying pneumonia.  Patient on rocephin and azithromycin.  Patient has hx of HTN and GERD.   Echo pending.   Patient currently on IV Lasix, Coreg, Amlodipine, Enalapril, and Hydralazine for HF/HTN.  Patient is on protonix for GERD.    Patient quit smoking approximately 45 years ago.    CHF Education:?? Educational session with patient and youngest son completed. Patient from home where two of her sons live with her.  Patient sitting up in bed with TV on and talking with son.   ? Provided patient with "Living Better with Heart Failure" packet. Briefly reviewed definition of heart failure and signs and symptoms of an exacerbation.?Explained to patient that HF is a chronic illness which requires self-assessment / self-management along with help from the cardiologist/PCP/HF Clinic. ??? *Reviewed importance of and reason behind checking weight daily in the AM, after using the bathroom, but before getting dressed.?Patient has scales and weighs herself every morning.   Son confirmed this.  ? ? Reviewed the following information with patient:  *Discussed when to call the Dr= weight gain of >2-3lb overnight of 5lb in a week,  *Discussed yellow zone= call MD: weight gain of >2-3lb overnight of 5lb in a week, increased swelling, increased SOB when lying down, chest discomfort, dizziness, increased fatigue *Red Zone= call 911: struggle to breath, fainting or near fainting, significant chest pain  ? *Reviewed low sodium diet-provided handout of recommended and not recommended foods. ?Reviewed reading labels with patient. Discussed fluid intake with patient as well. Patient not currently on a fluid restriction, but advised no more than 8-8 ounces glass of fluids per day.  *Instructed patient to take medications as prescribed for heart failure. Explained briefly why pt is on the medications  (either make you feel better, live longer or keep you out of the hospital) and discussed monitoring and side effects.   *Smoking Cessation - Patient is a former smoker.  Patient reported quitting about 45 years ago.   ? *Discussed the benefits of exercise.  Patient ambulates at baseline with a cane. She has a history of fall and fracturing her shoulder resulting in home PT/OT which has since stopped. She states she is still doing her exercises. She has declined home health services per RNCM's note.  Patient encouraged to remain as active as possible.?Patient informed this RN that she still drives her car at times when the weather is o.k.    *[facility]- Patient is an established patient in the [facility].  Patient informed she has a follow-up appointment in the HF Clninc on 03/11/2017 at 12:20 p.m.  Explained the role of the [facility].?Patient encouraged to keep this appointment. ?  Once again, the 5 Steps to Living Better with Heart Failure were reviewed.   Patient and son thanked me for providing the above information. ?  Roanna Epley, RN, BSN, [facility] Cardiovascular and Pulmonary Nurse Navigator

## 2017-03-01 NOTE — Progress Notes (Signed)
[facility] Progess Note    SYNOPSIS   84 year old female with hypertension, history of renal cell carcinoma and breast cancer, as well as chronic kidney disease who presents to the emergency department with shortness of breath.   ASSESSMENT/PLAN   Respiratory failure. Significantly improved, secondary to pulmonary edema. Chest x-ray consistent with congestive heart failure. Has been weaned off of noninvasive ventilation. Blood pressure is under improved control. Presently on nasal cannula supplemental oxygen.   Leukocytosis. Patient has been empirically started on azithromycin, Rocephin  COPD. On albuterol, Atrovent, Solu-Medrol.  Hyperglycemia. On sliding scale coverage  Will monitor in intensive care unit this morning if status improves will consider transfer to the floor later on today  INTAKE / OUTPUT:  Intake/Output Summary (Last 24 hours) at 03/01/2017 0902 Last data filed at 03/01/2017 0850 Gross per 24 hour  Intake 200 ml  Output 250 ml  Net -50 ml    Name: Margaret Matthews MRN: [id number] DOB: 1930/04/22    ADMISSION DATE:  02/28/2017  SUBJECTIVE:   Patient states she is doing well, states that her shortness of breath has significantly improved. Presently on nasal cannula, has been weaned off of noninvasive ventilation.  VITAL SIGNS: Temp:  [98.2 F (36.8 C)-98.4 F (36.9 C)] 98.4 F (36.9 C) (01/25 0725) Pulse Rate:  [76-106] 106 (01/25 0740) Resp:  [12-30] 21 (01/24 1832) BP: (118-176)/(58-94) 129/64 (01/25 0740) SpO2:  [91 %-100 %] 98 % (01/24 2325) FiO2 (%):  [32 %] 32 % (01/24 2325) Weight:  [112 lb 10.5 oz (51.1 kg)] 112 lb 10.5 oz (51.1 kg) (01/25 0500)  PHYSICAL EXAMINATION: Physical Examination:   VS: BP 129/64   Pulse (!) 106   Temp 98.4 F (36.9 C) (Oral)   Resp (!) 21   Ht 5\' 2"  (1.575 m)   Wt 112 lb 10.5 oz (51.1 kg)   SpO2 98%   BMI 20.60 kg/m   General Appearance: No distress  Neuro:without focal findings,  mental status normal. HEENT: PERRLA, EOM intact. Pulmonary: Scant crackles appreciated in both lung bases CardiovascularNormal S1,S2. Systolic murmur appreciated aortic post Abdomen: Benign, Soft, non-tender. Skin:   warm, no rashes, no ecchymosis  Extremities: normal, no cyanosis, clubbing.    LABORATORY PANEL:   CBC Recent Labs  Lab 02/28/17 0426  WBC 14.8*  HGB 12.5  HCT 38.2  PLT 280    Chemistries  Recent Labs  Lab 02/28/17 0426  NA 138  K 4.0  CL 103  CO2 25  GLUCOSE 336*  BUN 23*  CREATININE 0.80  CALCIUM 9.4  AST 24  ALT 13*  ALKPHOS 82  BILITOT 0.6    Recent Labs  Lab 02/28/17 1142 02/28/17 1633 02/28/17 2018 02/28/17 2340 03/01/17 0338 03/01/17 0720  GLUCAP 151* 296* 213* 134* 164* 115*   Recent Labs  Lab 02/28/17 0426  PHART 7.25*  PCO2ART 61*  PO2ART 322*   Recent Labs  Lab 02/28/17 0426  AST 24  ALT 13*  ALKPHOS 82  BILITOT 0.6  ALBUMIN 3.8    Cardiac Enzymes Recent Labs  Lab 02/28/17 0426  TROPONINI 0.03*    RADIOLOGY:  Dg Chest Port 1 View  Result Date: 02/28/2017 CLINICAL DATA:  Shortness of breath beginning 2 hours ago. EXAM: PORTABLE CHEST 1 VIEW COMPARISON:  05/30/2015 FINDINGS: Diffuse perihilar airspace and interstitial disease likely representing edema. Multifocal pneumonia could also have this appearance. Small bilateral pleural effusions. Cardiac enlargement. No pneumothorax. Calcification of the aorta. Old fracture deformity of  the left proximal humerus. IMPRESSION: Perihilar infiltrates and small pleural effusions. Likely representing edema but multifocal pneumonia could also have this appearance. Electronically Signed   By: Lucienne Capers M.D.   On: 02/28/2017 04:50   Hermelinda Dellen, DO  1/25/2019[name], female   DOB: [birth year], 84 y.o.   MRN: [id number]

## 2017-03-01 NOTE — Plan of Care (Signed)
Patient transferred from ICU into room. A&Ox4, weaned to room air, no c/o pain, denies needs at this time. Telemetry verified, bed in lowest position, educated to call for assistance. Will continue to monitor.   Education: Knowledge of General Education information will improve 03/01/2017 1552 - Progressing by Shary Key, RN   Health Behavior/Discharge Planning: Ability to manage health-related needs will improve 03/01/2017 1552 - Progressing by Frederico Hamman D, RN   Clinical Measurements: Ability to maintain clinical measurements within normal limits will improve 03/01/2017 1552 - Progressing by Frederico Hamman D, RN Will remain free from infection 03/01/2017 1552 - Progressing by Frederico Hamman D, RN Diagnostic test results will improve 03/01/2017 1552 - Progressing by Frederico Hamman D, RN Respiratory complications will improve 03/01/2017 1552 - Progressing by Frederico Hamman D, RN Cardiovascular complication will be avoided 03/01/2017 1552 - Progressing by Shary Key, RN

## 2017-03-01 NOTE — Progress Notes (Signed)
OT Cancellation Note  Patient Details Name: Margaret Matthews MRN: [id number] DOB: 1930/10/11   Cancelled Treatment:    Reason Eval/Treat Not Completed: Patient at procedure or test/ unavailable(Pt. receiving breathing treatment. Will attempt OT initial eval at a later time/date.)  Harrel Carina, MS, OTR/L 03/01/2017, 2:11 PM

## 2017-03-01 NOTE — Progress Notes (Signed)
Pt resting comfortably this shift denies pain or discomfort or SOB. Pt remains on oxygen 3lnc sats WNL.further assessment via flowsheet

## 2017-03-01 NOTE — Progress Notes (Signed)
[facility] [place] NAME: Margaret Matthews    MR#:  [id number]  DATE OF BIRTH:  1930-10-31  SUBJECTIVE:  CHIEF COMPLAINT: Patient  reports her shortness of breath is better, very hard of hearing  REVIEW OF SYSTEMS:  CONSTITUTIONAL: No fever, fatigue or weakness.  EYES: No blurred or double vision.  EARS, NOSE, AND THROAT: No tinnitus or ear pain.  RESPIRATORY: No cough, shortness of breath is better,  No wheezing or hemoptysis.  CARDIOVASCULAR: No chest pain, orthopnea, edema.  GASTROINTESTINAL: No nausea, vomiting, diarrhea or abdominal pain.  GENITOURINARY: No dysuria, hematuria.  ENDOCRINE: No polyuria, nocturia,  HEMATOLOGY: No anemia, easy bruising or bleeding SKIN: No rash or lesion. MUSCULOSKELETAL: No joint pain or arthritis.   NEUROLOGIC: No tingling, numbness, weakness.  PSYCHIATRY: No anxiety or depression.   DRUG ALLERGIES:   Allergies  Allergen Reactions  . Ezetimibe Other (See Comments)    ELEV LFTS  . Fenofibrate Micronized Other (See Comments)    ELEV LFTS  . Statins Other (See Comments)    Reaction: Pt says "it was showing up in her liver."   . Sulfamethoxazole-Trimethoprim Diarrhea    VITALS:  Blood pressure 115/67, pulse 87, temperature 98.1 F (36.7 C), temperature source Oral, resp. rate 18, height 5\' 2"  (1.575 m), weight 51.1 kg (112 lb 10.5 oz), SpO2 97 %.  PHYSICAL EXAMINATION:  GENERAL:  84 y.o.-year-old patient lying in the bed with no acute distress.  EYES: Pupils equal, round, reactive to light and accommodation. No scleral icterus. Extraocular muscles intact.  HEENT: Head atraumatic, normocephalic. Oropharynx and nasopharynx clear.  NECK:  Supple, no jugular venous distention. No thyroid enlargement, no tenderness.  LUNGS: mod breath sounds bilaterally, no wheezing, rales,rhonchi or crepitation. No use of accessory muscles of respiration.  CARDIOVASCULAR: S1, S2 normal. No murmurs, rubs, or  gallops.  ABDOMEN: Soft, nontender, nondistended. Bowel sounds present. No organomegaly or mass.  EXTREMITIES: No pedal edema, cyanosis, or clubbing.  NEUROLOGIC: Cranial nerves II through XII are intact. Muscle strength 5/5 in all extremities. Sensation intact. Gait not checked.  PSYCHIATRIC: The patient is alert and oriented x 3.  SKIN: No obvious rash, lesion, or ulcer.    LABORATORY PANEL:   CBC Recent Labs  Lab 02/28/17 0426  WBC 14.8*  HGB 12.5  HCT 38.2  PLT 280   ------------------------------------------------------------------------------------------------------------------  Chemistries  Recent Labs  Lab 02/28/17 0426  NA 138  K 4.0  CL 103  CO2 25  GLUCOSE 336*  BUN 23*  CREATININE 0.80  CALCIUM 9.4  AST 24  ALT 13*  ALKPHOS 82  BILITOT 0.6   ------------------------------------------------------------------------------------------------------------------  Cardiac Enzymes Recent Labs  Lab 02/28/17 0426  TROPONINI 0.03*   ------------------------------------------------------------------------------------------------------------------  RADIOLOGY:  Dg Chest Port 1 View  Result Date: 02/28/2017 CLINICAL DATA:  Shortness of breath beginning 2 hours ago. EXAM: PORTABLE CHEST 1 VIEW COMPARISON:  05/30/2015 FINDINGS: Diffuse perihilar airspace and interstitial disease likely representing edema. Multifocal pneumonia could also have this appearance. Small bilateral pleural effusions. Cardiac enlargement. No pneumothorax. Calcification of the aorta. Old fracture deformity of the left proximal humerus. IMPRESSION: Perihilar infiltrates and small pleural effusions. Likely representing edema but multifocal pneumonia could also have this appearance. Electronically Signed   By: Lucienne Capers M.D.   On: 02/28/2017 04:50    EKG:   Orders placed or performed during the hospital encounter of 02/28/17  . EKG 12-Lead  . EKG 12-Lead    ASSESSMENT AND  PLAN:    This is a 84 year old female admitted for CHF exacerbation. 1.  CHF: Acute; systolic. Clinically better today  Echocardiogram from 2 years ago shows preserved ejection fraction but severe aortic stenosis.  echocardiogram to evaluate cardiac output.   Continue to diurese with IV Lasix.   Continue Coreg. Consult cardiology  2.  Respiratory failure: Acute; with hypoxia.  Possible underlying pneumonia  off BiPAP Patient is empirically started on Rocephin and azithromycin by the intensivist will continue the same Patient will be transferred to telemetry  Supplemental oxygen as needed.  3.  Hypertension:  continue amlodipine, enalapril and hydralazine.  Nitroglycerin paste as needed.  Consider labetalol as needed  4.  GERD: Continue pantoprazole per home regimen  5.  DVT prophylaxis: Heparin 6.  GI prophylaxis: As above       All the records are reviewed and case discussed with Care Management/Social Workerr. Management plans discussed with the patient, family and they are in agreement.  CODE STATUS: DNR   TOTAL TIME TAKING CARE OF THIS PATIENT:  35 minutes.   POSSIBLE D/C IN 2  DAYS, DEPENDING ON CLINICAL CONDITION.  Note: This dictation was prepared with Dragon dictation along with smaller phrase technology. Any transcriptional errors that result from this process are unintentional.   Nicholes Mango M.D on 03/01/2017 at 1:50 PM  Between 7am to 6pm - Pager - [phone] After 6pm go to [email or web address] - password [email or web address] [facility]  Office  [phone]  CC: Primary care physician; Dion Body, MD

## 2017-03-01 NOTE — Evaluation (Signed)
Occupational Therapy Evaluation Patient Details Name: Margaret Matthews MRN: [id number] DOB: May 23, 1930 Today's Date: 03/01/2017    History of Present Illness Pt. is an 84 y.o. female who was admitted to [facility] with Acute Respiratory Failure, Acute systolic CHF, and HTN. Pt. was initially on BiPAP. Pt. PMHx includes: Breast CA, s/p left meastectomy, CKD, HTN, and shoulder hemiarthroplasty, and left hip surgery.   Clinical Impression   Pt. Presents with weakness, decreased activity tolerances, and limited mobility which limit  Pt.'s ability to perform ADL functioning safely. Pt. Resides at home with her 2 sons. Pt. was independent with daily ADL care needs however, her sons would assist her occasionally when needed. Pt. Sons assisted pt. With driving, shopping, meal preparation, and medication management. Pt. Could benefit from OT services for ADL training, Energy conservation, and work simplification techniques, and pt. Education about home modification, and DME.    Follow Up Recommendations  Home health OT    Equipment Recommendations       Recommendations for Other Services       Precautions / Restrictions Precautions Precautions: Fall Restrictions Weight Bearing Restrictions: No      Mobility Bed Mobility Overal bed mobility: Independent             General bed mobility comments: Supine to/from sit with mild increased effort.  Transfers Overall transfer level: Needs assistance Equipment used: Straight cane Transfers: Sit to/from [employer] Transfers Sit to Stand: Min guard Stand pivot transfers: Min guard       General transfer comment: fairly strong stands    Balance Overall balance assessment: Needs assistance Sitting-balance support: No upper extremity supported;Feet supported Sitting balance-Leahy Scale: Normal Sitting balance - Comments: steady sitting reaching outside BOS     Standing balance-Leahy Scale: Fair Standing balance comment: steady  static standing (requires single UE support if reaching within BOS though)                           ADL either performed or assessed with clinical judgement   ADL Overall ADL's : Needs assistance/impaired Eating/Feeding: Set up;Independent   Grooming: Set up;Independent   Upper Body Bathing: Set up;Independent   Lower Body Bathing: Set up;Minimal assistance   Upper Body Dressing : Set up;Independent   Lower Body Dressing: Set up;Minimal assistance               Functional mobility during ADLs: Min guard       Vision         Perception     Praxis      Pertinent Vitals/Pain Pain Assessment: No/denies pain     Hand Dominance Right   Extremity/Trunk Assessment Upper Extremity Assessment Upper Extremity Assessment: Generalized weakness     Communication Communication Communication: HOH   Cognition Arousal/Alertness: Awake/alert Behavior During Therapy: WFL for tasks assessed/performed Overall Cognitive Status: Within Functional Limits for tasks assessed                                     General Comments  Pt resting in bed upon PT arrival.    Exercises     Shoulder Instructions      Home Living Family/patient expects to be discharged to:: Private residence Living Arrangements: Children(Pt. has 2 grown sons) Available Help at Discharge: Family Type of Home: House Home Access: Stairs to enter [employer] of Steps: 2 Entrance Stairs-Rails:  Left Home Layout: Two level Alternate Level Stairs-Number of Steps: 13 with B railings Alternate Level Stairs-Rails: Right Bathroom Shower/Tub: (Takes sponge baths)         Home Equipment: Cane - single point   Additional Comments: Bedroom on second floor      Prior Functioning/Environment Level of Independence: Needs assistance  Gait / Transfers Assistance Needed: Modified independent with SPC ADL's / Homemaking Assistance Needed: Pt. was able to complete morning  ADLs, however Sons assist occassionally if needed. Pt. sons assist with meals, medication management, errands. Pt. and sons share the driving and shopping.   Comments: [place] in April 2018, and sustained a left shoulder injury.         OT Problem List: Decreased strength;Decreased knowledge of use of DME or AE;Impaired balance (sitting and/or standing);Impaired UE functional use      OT Treatment/Interventions: Self-care/ADL training;Therapeutic exercise;Patient/family education;DME and/or AE instruction;Therapeutic activities;Energy conservation    OT Goals(Current goals can be found in the care plan section) Acute Rehab OT Goals Patient Stated Goal: To return home OT Goal Formulation: With patient Potential to Achieve Goals: Good  OT Frequency: Min 2X/week   Barriers to D/C:            Co-evaluation              AM-PAC PT "6 Clicks" Daily Activity     Outcome Measure Help from another person eating meals?: None Help from another person taking care of personal grooming?: None Help from another person toileting, which includes using toliet, bedpan, or urinal?: A Little Help from another person bathing (including washing, rinsing, drying)?: A Little Help from another person to put on and taking off regular upper body clothing?: None Help from another person to put on and taking off regular lower body clothing?: A Little 6 Click Score: 21   End of Session Equipment Utilized During Treatment: Gait belt  Activity Tolerance: Patient tolerated treatment well Patient left: in bed;with call bell/phone within reach;with bed alarm set;with family/visitor present  OT Visit Diagnosis: Unsteadiness on feet (R26.81);History of falling (Z91.81);Muscle weakness (generalized) (M62.81)                Time: 2563-8937 OT Time Calculation (min): 35 min Charges:  OT General Charges $OT Visit: 1 Visit OT Evaluation $OT Eval Moderate Complexity: 1 Mod G-Codes:     Harrel Carina, MS,  OTR/L   Harrel Carina, MS, OTR/L 03/01/2017, 4:09 PM

## 2017-03-02 ENCOUNTER — Other Ambulatory Visit: Payer: Self-pay

## 2017-03-02 LAB — BASIC METABOLIC PANEL
ANION GAP: 10 (ref 5–15)
BUN: 57 mg/dL — ABNORMAL HIGH (ref 6–20)
CALCIUM: 9 mg/dL (ref 8.9–10.3)
CO2: 27 mmol/L (ref 22–32)
Chloride: 99 mmol/L — ABNORMAL LOW (ref 101–111)
Creatinine, Ser: 1.52 mg/dL — ABNORMAL HIGH (ref 0.44–1.00)
GFR, EST AFRICAN AMERICAN: 35 mL/min — AB (ref >60)
GFR, EST NON AFRICAN AMERICAN: 30 mL/min — AB (ref >60)
Glucose, Bld: 186 mg/dL — ABNORMAL HIGH (ref 65–99)
Potassium: 4.2 mmol/L (ref 3.5–5.1)
SODIUM: 136 mmol/L (ref 135–145)

## 2017-03-02 LAB — CBC
HCT: 35.9 % (ref 35.0–47.0)
Hemoglobin: 11.9 g/dL — ABNORMAL LOW (ref 12.0–16.0)
MCH: 29.2 pg (ref 26.0–34.0)
MCHC: 33.2 g/dL (ref 32.0–36.0)
MCV: 88 fL (ref 80.0–100.0)
Platelets: 232 10*3/uL (ref 150–440)
RBC: 4.07 MIL/uL (ref 3.80–5.20)
RDW: 14.8 % — AB (ref 11.5–14.5)
WBC: 12.1 10*3/uL — AB (ref 3.6–11.0)

## 2017-03-02 LAB — GLUCOSE, CAPILLARY
Glucose-Capillary: 184 mg/dL — ABNORMAL HIGH (ref 65–99)
Glucose-Capillary: 171 mg/dL — ABNORMAL HIGH (ref 65–99)
Glucose-Capillary: 167 mg/dL — ABNORMAL HIGH (ref 65–99)
Glucose-Capillary: 139 mg/dL — ABNORMAL HIGH (ref 65–99)

## 2017-03-02 MED ORDER — ENOXAPARIN SODIUM 30 MG/0.3ML ~~LOC~~ SOLN
30.0000 mg | SUBCUTANEOUS | Status: DC
Start: 2017-03-03 — End: 2017-03-03
  Administered 2017-03-03: 30 mg via SUBCUTANEOUS
  Filled 2017-03-02: qty 0.3

## 2017-03-02 MED ORDER — SODIUM CHLORIDE 0.9% FLUSH
3.0000 mL | Freq: Two times a day (BID) | INTRAVENOUS | Status: DC
  Administered 2017-03-02 – 2017-03-04 (×5): 3 mL via INTRAVENOUS

## 2017-03-02 MED ORDER — IPRATROPIUM-ALBUTEROL 0.5-2.5 (3) MG/3ML IN SOLN
3.0000 mL | Freq: Two times a day (BID) | RESPIRATORY_TRACT | Status: DC
  Administered 2017-03-02 – 2017-03-04 (×4): 3 mL via RESPIRATORY_TRACT
  Filled 2017-03-02 (×4): qty 3

## 2017-03-02 NOTE — Progress Notes (Signed)
Anticoagulation monitoring(Lovenox):   84yo  female ordered Lovenox 30 mg Q24h  Filed Weights   02/28/17 0800 03/01/17 0500 03/02/17 0515  Weight: 108 lb 14.5 oz (49.4 kg) 112 lb 10.5 oz (51.1 kg) 105 lb 3.2 oz (47.7 kg)   Body mass index is 19.24 kg/m.    Lab Results  Component Value Date   CREATININE 1.52 (H) 03/02/2017   CREATININE 0.80 02/28/2017   CREATININE 0.80 06/03/2015   Estimated Creatinine Clearance: 20.4 mL/min (A) (by C-G formula based on SCr of 1.52 mg/dL (H)). Hemoglobin & Hematocrit     Component Value Date/Time   HGB 11.9 (L) 03/02/2017 0901   HGB 13.7 09/07/2012 2322   HCT 35.9 03/02/2017 0901   HCT 39.4 09/07/2012 2322     Per Protocol for Patient with estCrcl < 30 ml/min and BMI < 40, will transition to Lovenox 30 mg Q24h.

## 2017-03-02 NOTE — Consult Note (Signed)
Reason for Consult: Congestive heart failure shortness of breath hypoxemia Referring Physician: Dr. Netty Starring primary,Dr Gouru hospitalist  Margaret Matthews is an 84 y.o. female.  HPI: Patient is aortic stenosis hypertension previous renal carcinoma breast cancer chronic renal insufficiency murmur with significant difficulty breathing and relatively low O2 sat.  Patient was admitted with chest x-ray with heavy possible congestive heart failure x-ray..  Patient echocardiogram 2 years ago with severe aortic stenosis she was treated medically patient was placed on BiPAP given Lasix with significant O2 sat from 80.  Patient's also had elevated blood pressure during this is been reviewed please see previous feels somewhat better now since initial presentation.  Denies any known coronary disease PCI she has hyperlipidemia cardiac occasional she is also persistent chronic diarrhea  Past Medical History:  Diagnosis Date  . Anemia   . Arthritis   . Breast cancer ([place])   . CKD (chronic kidney disease) stage 2, GFR 60-89 ml/min    baseline creatinine 1.5  . GERD (gastroesophageal reflux disease)   . Glaucoma   . History of nephrectomy    patient reports only one kidney  . Hypertension   . Hyponatremia   . Osteopenia   . PUD (peptic ulcer disease)   . Renal cell carcinoma [facility])     Past Surgical History:  Procedure Laterality Date  . BREAST SURGERY     Left mastectomy  . COLONOSCOPY    . HEMIARTHROPLASTY SHOULDER FRACTURE     Right shoulder  . HIP SURGERY     Left hip  . NEPHRECTOMY     Right nephrectomy    Family History  Problem Relation Age of Onset  . CAD Mother   . CVA Father   . CAD Sister     Social History:  reports that she has quit smoking. she has never used smokeless tobacco. She reports that she does not drink alcohol or use drugs.  Allergies:  Allergies  Allergen Reactions  . Ezetimibe Other (See Comments)    ELEV LFTS  . Fenofibrate Micronized Other (See  Comments)    ELEV LFTS  . Statins Other (See Comments)    Reaction: Pt says "it was showing up in her liver."   . Sulfamethoxazole-Trimethoprim Diarrhea    Medications please see pre- admission list reviewed .  Agree hospital medication list reviewed  Results for orders placed or performed during the hospital encounter of 02/28/17 (from the past 48 hour(s))  Glucose, capillary     Status: Abnormal   Collection Time: 02/28/17  8:18 PM  Result Value Ref Range   Glucose-Capillary 213 (H) 65 - 99 mg/dL  Glucose, capillary     Status: Abnormal   Collection Time: 02/28/17 11:40 PM  Result Value Ref Range   Glucose-Capillary 134 (H) 65 - 99 mg/dL  Glucose, capillary     Status: Abnormal   Collection Time: 03/01/17  3:38 AM  Result Value Ref Range   Glucose-Capillary 164 (H) 65 - 99 mg/dL  Glucose, capillary     Status: Abnormal   Collection Time: 03/01/17  7:20 AM  Result Value Ref Range   Glucose-Capillary 115 (H) 65 - 99 mg/dL  Glucose, capillary     Status: Abnormal   Collection Time: 03/01/17 12:08 PM  Result Value Ref Range   Glucose-Capillary 268 (H) 65 - 99 mg/dL  Glucose, capillary     Status: Abnormal   Collection Time: 03/01/17  4:38 PM  Result Value Ref Range   Glucose-Capillary 191 (H)  65 - 99 mg/dL  Glucose, capillary     Status: Abnormal   Collection Time: 03/01/17 10:44 PM  Result Value Ref Range   Glucose-Capillary 153 (H) 65 - 99 mg/dL   Comment 1 Notify RN    Comment 2 Document in Chart   CBC     Status: Abnormal   Collection Time: 03/02/17  9:01 AM  Result Value Ref Range   WBC 12.1 (H) 3.6 - 11.0 K/uL   RBC 4.07 3.80 - 5.20 MIL/uL   Hemoglobin 11.9 (L) 12.0 - 16.0 g/dL   HCT 35.9 35.0 - 47.0 %   MCV 88.0 80.0 - 100.0 fL   MCH 29.2 26.0 - 34.0 pg   MCHC 33.2 32.0 - 36.0 g/dL   RDW 14.8 (H) 11.5 - 14.5 %   Platelets 232 150 - 440 K/uL    Comment: Performed at [facility], [address] [place], [place] [address]  Basic metabolic panel      Status: Abnormal   Collection Time: 03/02/17  9:01 AM  Result Value Ref Range   Sodium 136 135 - 145 mmol/L   Potassium 4.2 3.5 - 5.1 mmol/L   Chloride 99 (L) 101 - 111 mmol/L   CO2 27 22 - 32 mmol/L   Glucose, Bld 186 (H) 65 - 99 mg/dL   BUN 57 (H) 6 - 20 mg/dL   Creatinine, Ser 1.52 (H) 0.44 - 1.00 mg/dL   Calcium 9.0 8.9 - 10.3 mg/dL   GFR calc non Af Amer 30 (L) >60 mL/min   GFR calc Af Amer 35 (L) >60 mL/min    Comment: (NOTE) The eGFR has been calculated using the CKD EPI equation. This calculation has not been validated in all clinical situations. eGFR's persistently <60 mL/min signify possible Chronic Kidney Disease.    Anion gap 10 5 - 15    Comment: Performed at [facility], [address] [place], [place] [address]  Glucose, capillary     Status: Abnormal   Collection Time: 03/02/17  9:05 AM  Result Value Ref Range   Glucose-Capillary 184 (H) 65 - 99 mg/dL   Comment 1 Notify RN   Glucose, capillary     Status: Abnormal   Collection Time: 03/02/17 11:47 AM  Result Value Ref Range   Glucose-Capillary 171 (H) 65 - 99 mg/dL   Comment 1 Notify RN   Glucose, capillary     Status: Abnormal   Collection Time: 03/02/17  4:40 PM  Result Value Ref Range   Glucose-Capillary 167 (H) 65 - 99 mg/dL   Comment 1 Notify RN     No results found.  Review of Systems  Constitutional: Positive for malaise/fatigue.  HENT: Positive for congestion.   Eyes: Negative.   Respiratory: Positive for cough and shortness of breath.   Cardiovascular: Positive for orthopnea, leg swelling and PND.  Gastrointestinal: Negative.   Genitourinary: Negative.   Musculoskeletal: Positive for myalgias.  Skin: Negative.   Neurological: Positive for weakness.  Endo/Heme/Allergies: Negative.   Psychiatric/Behavioral: Positive for memory loss.   Blood pressure (!) 110/51, pulse 74, temperature 98 F (36.7 C), temperature source Oral, resp. rate 12, height 5' 2"  (1.575 m), weight 105 lb  3.2 oz (47.7 kg), SpO2 95 %. Physical Exam  Nursing note and vitals reviewed. Constitutional: She is oriented to person, place, and time. She appears well-developed and well-nourished.  HENT:  Head: Normocephalic and atraumatic.  Reduced hearing  Eyes: Conjunctivae and EOM are normal. Pupils are  equal, round, and reactive to light.  Neck: Normal range of motion. Neck supple. JVD present.  Cardiovascular: Normal rate and regular rhythm.  Murmur heard. Respiratory: She has rales.  GI: Soft. Bowel sounds are normal.  Musculoskeletal: Normal range of motion.  Neurological: She is alert and oriented to person, place, and time. She has normal reflexes.  Skin: Skin is warm and dry.  Psychiatric: She has a normal mood and affect.    Assessment/Plan: SOB CHF Congestion Aortic stenosis Murmur CRI HTN GERD Breast Cancer . PLAN Continue inhalers Agree with diuretics Bp control Supplimental 02 Consider TAVR for AS Recommend nephrology for CRI Continue pravachol for lipids Agree with ECHO for AS Recommend conservative medical therapy  Dwayne D Callwood 03/02/2017, 7:13 PM

## 2017-03-02 NOTE — Progress Notes (Signed)
[facility] at [place] NAME: Margaret Matthews    MR#:  [id number]  DATE OF BIRTH:  1930-04-13  SUBJECTIVE:  CHIEF COMPLAINT: Patient  reports her shortness of breath is much better, very hard of hearing  REVIEW OF SYSTEMS:  CONSTITUTIONAL: No fever, fatigue or weakness.  EYES: No blurred or double vision.  EARS, NOSE, AND THROAT: No tinnitus or ear pain.  RESPIRATORY: No cough, shortness of breath is better,  No wheezing or hemoptysis.  CARDIOVASCULAR: No chest pain, orthopnea, edema.  GASTROINTESTINAL: No nausea, vomiting, diarrhea or abdominal pain.  GENITOURINARY: No dysuria, hematuria.  ENDOCRINE: No polyuria, nocturia,  HEMATOLOGY: No anemia, easy bruising or bleeding SKIN: No rash or lesion. MUSCULOSKELETAL: No joint pain or arthritis.   NEUROLOGIC: No tingling, numbness, weakness.  PSYCHIATRY: No anxiety or depression.   DRUG ALLERGIES:   Allergies  Allergen Reactions  . Ezetimibe Other (See Comments)    ELEV LFTS  . Fenofibrate Micronized Other (See Comments)    ELEV LFTS  . Statins Other (See Comments)    Reaction: Pt says "it was showing up in her liver."   . Sulfamethoxazole-Trimethoprim Diarrhea    VITALS:  Blood pressure (!) 110/51, pulse 74, temperature 98 F (36.7 C), temperature source Oral, resp. rate 12, height 5\' 2"  (1.575 m), weight 47.7 kg (105 lb 3.2 oz), SpO2 95 %.  PHYSICAL EXAMINATION:  GENERAL:  84 y.o.-year-old patient lying in the bed with no acute distress.  EYES: Pupils equal, round, reactive to light and accommodation. No scleral icterus. Extraocular muscles intact.  HEENT: Head atraumatic, normocephalic. Oropharynx and nasopharynx clear.  NECK:  Supple, no jugular venous distention. No thyroid enlargement, no tenderness.  LUNGS: mod breath sounds bilaterally, no wheezing, rales,rhonchi or crepitation. No use of accessory muscles of respiration.  CARDIOVASCULAR: S1, S2 normal. No murmurs, rubs,  or gallops.  ABDOMEN: Soft, nontender, nondistended. Bowel sounds present. No organomegaly or mass.  EXTREMITIES: No pedal edema, cyanosis, or clubbing.  NEUROLOGIC: Cranial nerves II through XII are intact. Muscle strength 5/5 in all extremities. Sensation intact. Gait not checked.  PSYCHIATRIC: The patient is alert and oriented x 3.  SKIN: No obvious rash, lesion, or ulcer.    LABORATORY PANEL:   CBC Recent Labs  Lab 03/02/17 0901  WBC 12.1*  HGB 11.9*  HCT 35.9  PLT 232   ------------------------------------------------------------------------------------------------------------------  Chemistries  Recent Labs  Lab 02/28/17 0426 03/02/17 0901  NA 138 136  K 4.0 4.2  CL 103 99*  CO2 25 27  GLUCOSE 336* 186*  BUN 23* 57*  CREATININE 0.80 1.52*  CALCIUM 9.4 9.0  AST 24  --   ALT 13*  --   ALKPHOS 82  --   BILITOT 0.6  --    ------------------------------------------------------------------------------------------------------------------  Cardiac Enzymes Recent Labs  Lab 02/28/17 0426  TROPONINI 0.03*   ------------------------------------------------------------------------------------------------------------------  RADIOLOGY:  No results found.  EKG:   Orders placed or performed during the hospital encounter of 02/28/17  . EKG 12-Lead  . EKG 12-Lead    ASSESSMENT AND PLAN:   This is a 84 year old female admitted for CHF exacerbation. 1.  CHF: Acute; systolic. Clinically better   Echocardiogram from 2 years ago shows preserved ejection fraction but severe aortic stenosis.  echocardiogram to evaluate cardiac output.   Worsening of creatinine, holding IV Lasix Consult cardiology Continue aspirin, Coreg; continue statin.  Lasix on hold  2.  Respiratory failure: Acute; with hypoxia.  Possible underlying pneumonia  off BiPAP Patient is empirically started on Rocephin and azithromycin by the intensivist will continue the same Patient will be  transferred to telemetry  Supplemental oxygen as needed.  3.    Acute kidney injury  Baseline creatinine is normal creatinine went up to 1.5.  Holding ACE inhibitor, and Lasix Repeat a.m. labs  4.  Hypertension:  continue amlodipine, enalapril and hydralazine.  Nitroglycerin paste as needed.  Consider labetalol as needed  5.  GERD: Continue pantoprazole per home regimen  6.  Elevated TSH could be acute phase reactant Check free T4 and T3     DVT prophylaxis: Heparin  GI prophylaxis: As above       All the records are reviewed and case discussed with Care Management/Social Workerr. Management plans discussed with the patient, family and they are in agreement.  CODE STATUS: DNR   TOTAL TIME TAKING CARE OF THIS PATIENT:  35 minutes.   POSSIBLE D/C IN 2  DAYS, DEPENDING ON CLINICAL CONDITION.  Note: This dictation was prepared with Dragon dictation along with smaller phrase technology. Any transcriptional errors that result from this process are unintentional.   Nicholes Mango M.D on 03/02/2017 at 7:21 PM  Between 7am to 6pm - Pager - [phone] After 6pm go to [email or web address] - password [email or web address] [facility]  Office  [phone]  CC: Primary care physician; Dion Body, MD

## 2017-03-02 NOTE — Plan of Care (Signed)
  Progressing Education: Knowledge of General Education information will improve 03/02/2017 0111 - Progressing by Marylouise Stacks, RN Health Behavior/Discharge Planning: Ability to manage health-related needs will improve 03/02/2017 0111 - Progressing by Marylouise Stacks, RN Clinical Measurements: Ability to maintain clinical measurements within normal limits will improve 03/02/2017 0111 - Progressing by Marylouise Stacks, RN Will remain free from infection 03/02/2017 0111 - Progressing by Marylouise Stacks, RN Diagnostic test results will improve 03/02/2017 0111 - Progressing by Marylouise Stacks, RN Respiratory complications will improve 03/02/2017 0111 - Progressing by Marylouise Stacks, RN Cardiovascular complication will be avoided 03/02/2017 0111 - Progressing by Marylouise Stacks, RN Activity: Risk for activity intolerance will decrease 03/02/2017 0111 - Progressing by Marylouise Stacks, RN Nutrition: Adequate nutrition will be maintained 03/02/2017 0111 - Progressing by Marylouise Stacks, RN Coping: Level of anxiety will decrease 03/02/2017 0111 - Progressing by Marylouise Stacks, RN Elimination: Will not experience complications related to bowel motility 03/02/2017 0111 - Progressing by Marylouise Stacks, RN Will not experience complications related to urinary retention 03/02/2017 0111 - Progressing by Marylouise Stacks, RN Pain Managment: General experience of comfort will improve 03/02/2017 0111 - Progressing by Marylouise Stacks, RN Safety: Ability to remain free from injury will improve 03/02/2017 0111 - Progressing by Marylouise Stacks, RN Skin Integrity: Risk for impaired skin integrity will decrease 03/02/2017 0111 - Progressing by Marylouise Stacks, RN

## 2017-03-03 ENCOUNTER — Inpatient Hospital Stay (HOSPITAL_COMMUNITY)
Admit: 2017-03-03 | Discharge: 2017-03-03 | Disposition: A | Discharge status: Home / Self Care | Payer: Medicare Other | Attending: Internal Medicine | Admitting: Internal Medicine

## 2017-03-03 DIAGNOSIS — I509 Heart failure, unspecified: Secondary | ICD-10-CM

## 2017-03-03 LAB — ECHOCARDIOGRAM COMPLETE
AO mean calculated velocity dopler: 337 cm/s
AOPV: 0.19 m/s
AOVTI: 116 cm
AV Area VTI index: 0.32 cm2/m2
AV Area mean vel: 0.41 cm2
AV Peak grad: 77 mmHg
AV VEL mean LVOT/AV: 0.18
AV area mean vel ind: 0.28 cm2/m2
AV peak Index: 0.3
AVAREAVTI: 0.43 cm2
AVG: 51 mmHg
AVPKVEL: 438 cm/s
CHL CUP AV VALUE AREA INDEX: 0.32
CHL CUP AV VEL: 0.47
E decel time: 120 msec
E/e' ratio: 32.76
FS: 21 % — AB (ref 28–44)
HEIGHTINCHES: 62 in
IV/PV OW: 1.07
LA ID, A-P, ES: 46 mm
LA diam end sys: 46 mm
LA diam index: 3.17 cm/m2
LA vol: 92.9 mL
LAVOLA4C: 74.1 mL
LAVOLIN: 64 mL/m2
LV E/e' medial: 32.76
LV E/e'average: 32.76
LV TDI E'MEDIAL: 3.86
LV e' LATERAL: 4.64 cm/s
LVOT MV VTI INDEX: 1.08 cm2/m2
LVOT MV VTI: 1.57
LVOT SV: 55 mL
LVOT VTI: 24.2 cm
LVOT area: 2.27 cm2
LVOT diameter: 17 mm
LVOT peak VTI: 0.21 cm
LVOTPV: 83.4 cm/s
MV Dec: 120
MV M vel: 89.4
MV Peak grad: 9 mmHg
MV pk A vel: 125 m/s
MV pk E vel: 152 m/s
MVANNULUSVTI: 35.1 cm
MVAP: 6.29 cm2
MVSPHT: 35 ms
Mean grad: 4 mmHg
PW: 10.7 mm — AB (ref 0.6–1.1)
RV LATERAL S' VELOCITY: 13.3 cm/s
RV TAPSE: 28.7 mm
TDI e' lateral: 4.64
Valve area: 0.47 cm2
WEIGHTICAEL: 1704 [oz_av]

## 2017-03-03 LAB — BASIC METABOLIC PANEL
ANION GAP: 9 (ref 5–15)
BUN: 66 mg/dL — ABNORMAL HIGH (ref 6–20)
CO2: 29 mmol/L (ref 22–32)
Calcium: 8.9 mg/dL (ref 8.9–10.3)
Chloride: 100 mmol/L — ABNORMAL LOW (ref 101–111)
Creatinine, Ser: 1.56 mg/dL — ABNORMAL HIGH (ref 0.44–1.00)
GFR calc Af Amer: 34 mL/min — ABNORMAL LOW (ref >60)
GFR, EST NON AFRICAN AMERICAN: 29 mL/min — AB (ref >60)
GLUCOSE: 160 mg/dL — AB (ref 65–99)
POTASSIUM: 4.1 mmol/L (ref 3.5–5.1)
Sodium: 138 mmol/L (ref 135–145)

## 2017-03-03 LAB — GLUCOSE, CAPILLARY
Glucose-Capillary: 102 mg/dL — ABNORMAL HIGH (ref 65–99)
Glucose-Capillary: 193 mg/dL — ABNORMAL HIGH (ref 65–99)
Glucose-Capillary: 160 mg/dL — ABNORMAL HIGH (ref 65–99)

## 2017-03-03 LAB — T4, FREE: Free T4: 0.95 ng/dL (ref 0.61–1.12)

## 2017-03-03 MED ORDER — LOPERAMIDE HCL 2 MG PO CAPS
2.0000 mg | ORAL_CAPSULE | ORAL | Status: DC | PRN
  Administered 2017-03-03: 2 mg via ORAL
  Filled 2017-03-03: qty 1

## 2017-03-03 MED ORDER — ENOXAPARIN SODIUM 30 MG/0.3ML ~~LOC~~ SOLN: 30.0000 mg | SUBCUTANEOUS | Status: DC

## 2017-03-03 NOTE — Progress Notes (Signed)
[facility] at [place] NAME: Margaret Matthews    MR#:  [id number]  DATE OF BIRTH:  1930/10/01  SUBJECTIVE:  CHIEF COMPLAINT: Patient  reports her shortness of breath is much better, very hard of hearing.  No complaints today  REVIEW OF SYSTEMS:  CONSTITUTIONAL: No fever, fatigue or weakness.  EYES: No blurred or double vision.  EARS, NOSE, AND THROAT: No tinnitus or ear pain.  RESPIRATORY: No cough, shortness of breath is better,  No wheezing or hemoptysis.  CARDIOVASCULAR: No chest pain, orthopnea, edema.  GASTROINTESTINAL: No nausea, vomiting, diarrhea or abdominal pain.  GENITOURINARY: No dysuria, hematuria.  ENDOCRINE: No polyuria, nocturia,  HEMATOLOGY: No anemia, easy bruising or bleeding SKIN: No rash or lesion. MUSCULOSKELETAL: No joint pain or arthritis.   NEUROLOGIC: No tingling, numbness, weakness.  PSYCHIATRY: No anxiety or depression.   DRUG ALLERGIES:   Allergies  Allergen Reactions  . Ezetimibe Other (See Comments)    ELEV LFTS  . Fenofibrate Micronized Other (See Comments)    ELEV LFTS  . Statins Other (See Comments)    Reaction: Pt says "it was showing up in her liver."   . Sulfamethoxazole-Trimethoprim Diarrhea    VITALS:  Blood pressure 137/71, pulse 80, temperature 97.8 F (36.6 C), temperature source Oral, resp. rate 18, height 5\' 2"  (1.575 m), weight 48.3 kg (106 lb 8 oz), SpO2 98 %.  PHYSICAL EXAMINATION:  GENERAL:  84 y.o.-year-old patient lying in the bed with no acute distress.  EYES: Pupils equal, round, reactive to light and accommodation. No scleral icterus. Extraocular muscles intact.  HEENT: Head atraumatic, normocephalic. Oropharynx and nasopharynx clear.  NECK:  Supple, no jugular venous distention. No thyroid enlargement, no tenderness.  LUNGS: mod breath sounds bilaterally, no wheezing, rales,rhonchi or crepitation. No use of accessory muscles of respiration.  CARDIOVASCULAR: S1, S2 normal.  No murmurs, rubs, or gallops.  ABDOMEN: Soft, nontender, nondistended. Bowel sounds present. No organomegaly or mass.  EXTREMITIES: No pedal edema, cyanosis, or clubbing.  NEUROLOGIC: Cranial nerves II through XII are intact. Muscle strength 5/5 in all extremities. Sensation intact. Gait not checked.  PSYCHIATRIC: The patient is alert and oriented x 3.  SKIN: No obvious rash, lesion, or ulcer.    LABORATORY PANEL:   CBC Recent Labs  Lab 03/02/17 0901  WBC 12.1*  HGB 11.9*  HCT 35.9  PLT 232   ------------------------------------------------------------------------------------------------------------------  Chemistries  Recent Labs  Lab 02/28/17 0426  03/03/17 0509  NA 138   < > 138  K 4.0   < > 4.1  CL 103   < > 100*  CO2 25   < > 29  GLUCOSE 336*   < > 160*  BUN 23*   < > 66*  CREATININE 0.80   < > 1.56*  CALCIUM 9.4   < > 8.9  AST 24  --   --   ALT 13*  --   --   ALKPHOS 82  --   --   BILITOT 0.6  --   --    < > = values in this interval not displayed.   ------------------------------------------------------------------------------------------------------------------  Cardiac Enzymes Recent Labs  Lab 02/28/17 0426  TROPONINI 0.03*   ------------------------------------------------------------------------------------------------------------------  RADIOLOGY:  No results found.  EKG:   Orders placed or performed during the hospital encounter of 02/28/17  . EKG 12-Lead  . EKG 12-Lead    ASSESSMENT AND PLAN:   This is a 84 year old female admitted for CHF exacerbation.  1.  CHF: Acute; systolic. Clinically better   Echocardiogram from 2 years ago shows preserved ejection fraction but severe aortic stenosis.  echocardiogram to evaluate cardiac output.   Worsening of creatinine, holding IV Lasix Appreciate cardiology recommendations.  Dr. Clayborn Bigness is following Continue aspirin, Coreg; continue statin.  Lasix on hold  2.  Respiratory failure: Acute;  with hypoxia.  Possible underlying pneumonia  off BiPAP Patient is empirically started on Rocephin and azithromycin by the intensivist will continue the same Patient will be transferred to telemetry  Supplemental oxygen as needed.  3.    Acute kidney injury  Baseline creatinine is normal creatinine went up to 1.5.-->  1.56 holding ACE inhibitor, and Lasix Repeat a.m. Labs, will consider nephrology consult if no improvement  4.  Hypertension:  continue amlodipine, enalapril and hydralazine.  Nitroglycerin paste as needed.  Consider labetalol as needed  5.  GERD: Continue pantoprazole per home regimen  6.  Elevated TSH could be acute phase reactant Normal free T4 and pending T3     DVT prophylaxis: Heparin  GI prophylaxis: As above    PT is recommending home health PT   All the records are reviewed and case discussed with Care Management/Social Workerr. Management plans discussed with the patient, family and they are in agreement.  CODE STATUS: DNR   TOTAL TIME TAKING CARE OF THIS PATIENT:  35 minutes.   POSSIBLE D/C IN 2  DAYS, DEPENDING ON CLINICAL CONDITION.  Note: This dictation was prepared with Dragon dictation along with smaller phrase technology. Any transcriptional errors that result from this process are unintentional.   Nicholes Mango M.D on 03/03/2017 at 1:38 PM  Between 7am to 6pm - Pager - [phone] After 6pm go to [email or web address] - password [email or web address] [facility]  Office  [phone]  CC: Primary care physician; Dion Body, MD

## 2017-03-04 LAB — BASIC METABOLIC PANEL
Anion gap: 7 (ref 5–15)
BUN: 64 mg/dL — ABNORMAL HIGH (ref 6–20)
CALCIUM: 8.9 mg/dL (ref 8.9–10.3)
CO2: 29 mmol/L (ref 22–32)
Chloride: 104 mmol/L (ref 101–111)
Creatinine, Ser: 1.16 mg/dL — ABNORMAL HIGH (ref 0.44–1.00)
GFR, EST AFRICAN AMERICAN: 48 mL/min — AB (ref >60)
GFR, EST NON AFRICAN AMERICAN: 42 mL/min — AB (ref >60)
Glucose, Bld: 206 mg/dL — ABNORMAL HIGH (ref 65–99)
POTASSIUM: 4.1 mmol/L (ref 3.5–5.1)
SODIUM: 140 mmol/L (ref 135–145)

## 2017-03-04 LAB — GLUCOSE, CAPILLARY
Glucose-Capillary: 133 mg/dL — ABNORMAL HIGH (ref 65–99)
Glucose-Capillary: 231 mg/dL — ABNORMAL HIGH (ref 65–99)

## 2017-03-04 LAB — T3: T3 TOTAL: 42 ng/dL — AB (ref 71–180)

## 2017-03-04 MED ORDER — PREDNISONE 10 MG (21) PO TBPK: 10.0000 mg | ORAL_TABLET | Freq: Every day | ORAL | 0 refills | Status: DC

## 2017-03-04 MED ORDER — AMOXICILLIN-POT CLAVULANATE 875-125 MG PO TABS: 1.0000 | ORAL_TABLET | Freq: Two times a day (BID) | ORAL | Status: DC

## 2017-03-04 MED ORDER — FUROSEMIDE 20 MG PO TABS: 20.0000 mg | ORAL_TABLET | Freq: Two times a day (BID) | ORAL | 0 refills | Status: DC

## 2017-03-04 MED ORDER — ALBUTEROL SULFATE HFA 108 (90 BASE) MCG/ACT IN AERS: 2.0000 | INHALATION_SPRAY | Freq: Four times a day (QID) | RESPIRATORY_TRACT | 1 refills | Status: DC | PRN

## 2017-03-04 MED ORDER — AMOXICILLIN-POT CLAVULANATE 875-125 MG PO TABS: 1.0000 | ORAL_TABLET | Freq: Two times a day (BID) | ORAL | 0 refills | Status: DC

## 2017-03-04 NOTE — Progress Notes (Signed)
Occupational Therapy Treatment Patient Details Name: Margaret Matthews MRN: [id number] DOB: April 28, 1930 Today's Date: 03/04/2017    History of present illness Pt. is an 84 y.o. female who was admitted to [facility] with Acute Respiratory Failure, Acute systolic CHF, and HTN. Pt. was initially on BiPAP. Pt. PMHx includes: Breast CA, s/p left meastectomy, CKD, HTN, and shoulder hemiarthroplasty, and left hip surgery.   OT comments  Pt seen for OT tx this date. Pt pleasant, eager to work with therapist. Pt educated in energy conservation strategies including home/routines modifications, activity pacing, work simplification, pursed lip breathing, and falls prevention strategies to maximize safety and functional independence. Handout provided. Pt able to verbalize routines for ADL and IADL at home while OT offered various strategies to maximize safety and independence while performing. Pt verbalized understanding of all education/training provided. Continues to benefit from skilled OT services to address noted impairments and functional deficits in order to maximize return to PLOF. HHOT continues to be appropriate following discharge.    Follow Up Recommendations  Home health OT    Equipment Recommendations  Other (comment)(TBD at next venue of care)    Recommendations for Other Services      Precautions / Restrictions Precautions Precautions: Fall Restrictions Weight Bearing Restrictions: No       Mobility Bed Mobility                  Transfers                      Balance                                           ADL either performed or assessed with clinical judgement   ADL Overall ADL's : Needs assistance/impaired             Lower Body Bathing: Set up;Minimal assistance Lower Body Bathing Details (indicate cue type and reason): pt reports taking sponge baths on toilet at home, discussed strategies to improve comfort and satisfaction with  bathing at home, including possible benefit of BSC/shower chair in shower to improve safety     Lower Body Dressing: Set up;Supervision/safety Lower Body Dressing Details (indicate cue type and reason): pt able to complete doffing/donning of LB dressing from EOB with supervision                      Vision Patient Visual Report: No change from baseline     Perception     Praxis      Cognition Arousal/Alertness: Awake/alert Behavior During Therapy: WFL for tasks assessed/performed Overall Cognitive Status: Within Functional Limits for tasks assessed                                          Exercises Other Exercises Other Exercises: Pt educated in energy conservation strategies including home/routines modifications, activity pacing, work simplification, pursed lip breathing, and falls prevention strategies to maximize safety and functional independence. Handout provided.   Shoulder Instructions       General Comments      Pertinent Vitals/ Pain       Pain Assessment: No/denies pain  Home Living  Prior Functioning/Environment              Frequency  Min 2X/week        Progress Toward Goals  OT Goals(current goals can now be found in the care plan section)  Progress towards OT goals: Progressing toward goals  Acute Rehab OT Goals Patient Stated Goal: To return home OT Goal Formulation: With patient Potential to Achieve Goals: Good  Plan Discharge plan remains appropriate;Frequency remains appropriate    Co-evaluation                 AM-PAC PT "6 Clicks" Daily Activity     Outcome Measure   Help from another person eating meals?: None Help from another person taking care of personal grooming?: None Help from another person toileting, which includes using toliet, bedpan, or urinal?: A Little Help from another person bathing (including washing, rinsing, drying)?: A  Little Help from another person to put on and taking off regular upper body clothing?: None Help from another person to put on and taking off regular lower body clothing?: A Little 6 Click Score: 21    End of Session    OT Visit Diagnosis: Unsteadiness on feet (R26.81);History of falling (Z91.81);Muscle weakness (generalized) (M62.81)   Activity Tolerance Patient tolerated treatment well   Patient Left in bed;with call bell/phone within reach;with bed alarm set   Nurse Communication          Time: 9509-3267 OT Time Calculation (min): 24 min  Charges: OT General Charges $OT Visit: 1 Visit OT Treatments $Self Care/Home Management : 23-37 mins  Jeni Salles, MPH, MS, OTR/L ascom [phone] 03/04/17, 11:06 AM

## 2017-03-04 NOTE — Discharge Summary (Signed)
[facility] NAME: Margaret Matthews    MR#:  [id number]  DATE OF BIRTH:  [birth year]  DATE OF ADMISSION:  02/28/2017 ADMITTING PHYSICIAN: Harrie Foreman, MD  DATE OF DISCHARGE:  03/04/17 PRIMARY CARE PHYSICIAN: Dion Body, MD    ADMISSION DIAGNOSIS:  Acute pulmonary edema ([place]) [J81.0] SOB (shortness of breath) [R06.02] Hypoxia [R09.02] Acute on chronic congestive heart failure, unspecified heart failure type ([place]) [I50.9]  DISCHARGE DIAGNOSIS:  Active Problems:   Acute systolic CHF (congestive heart failure) (HCC) Pneumonia Severe aortic stenosis  SECONDARY DIAGNOSIS:   Past Medical History:  Diagnosis Date  . Anemia   . Arthritis   . Breast cancer ([place])   . CKD (chronic kidney disease) stage 2, GFR 60-89 ml/min    baseline creatinine 1.5  . GERD (gastroesophageal reflux disease)   . Glaucoma   . History of nephrectomy    patient reports only one kidney  . Hypertension   . Hyponatremia   . Osteopenia   . PUD (peptic ulcer disease)   . Renal cell carcinoma [facility])     HOSPITAL COURSE:  HPI: This is an 84 year old female with hypertension, history of renal cell carcinoma and breast cancer, as well as chronic kidney disease who presents to the emergency department with shortness of breath.  The patient states that she awoke with difficulty breathing.  She admits that her chest feels heavy but denies pain.  She also denies nausea, vomiting or diaphoresis.  Chest x-ray showed pulmonary edema and the patient's work of breathing required noninvasive ventilation.  The patient was placed on BiPAP and given Lasix 20 mg IV.  Oxygen saturations were in the mid 80s.  Patient's work of breathing marginally improved yet her blood pressure remained elevated which prompted the emergency department staff to call the hospitalist service for admission.   1. CHF: Acute; diastolic Clinically better  Echocardiogram from 2  years ago shows preserved ejection fraction but severe aortic stenosis.  Repeat echocardiogram has revealed 55-60% ejection fraction but severe aortic stenosis which is at critical stage Worsening of creatinine, held IV Lasix, but his creatinine is at 1.16.  Resume p.o. Lasix Appreciate cardiology recommendations.  Dr. Clayborn Bigness is following, conservative management recommended Continue aspirin, Coreg; continue statin.  Lasix   2. Respiratory failure: Acute; with hypoxia.  Possible underlying pneumonia  off BiPAP Patient is empirically started on Rocephin and azithromycin by the intensivist will continue the same, d/c with augmentin 3 days Patient will be transferred to telemetry Supplemental oxygen as needed.  3.   Acute kidney injury  Baseline creatinine is normal creatinine went up to 1.5.-->  1.56 held patient's ACE inhibitor and Lasix, creatinine improved significantly at 1.16.  Resume Lasix and ACE inhibitor   4.  Hypertension:  continue amlodipine, enalapril and hydralazine. Nitroglycerin paste as needed. Consider labetalol as needed  5. GERD: Continue pantoprazole per home regimen  6.  Elevated TSH could be acute phase reactant Normal free T4 and pending T3.  PCP to follow-up.     DISCHARGE CONDITIONS:   STABLE  CONSULTS OBTAINED:  Treatment Team:  Yolonda Kida, MD   PROCEDURES  Bipap  DRUG ALLERGIES:   Allergies  Allergen Reactions  . Ezetimibe Other (See Comments)    ELEV LFTS  . Fenofibrate Micronized Other (See Comments)    ELEV LFTS  . Statins Other (See Comments)    Reaction: Pt says "it was showing up in her liver."   .  Sulfamethoxazole-Trimethoprim Diarrhea    DISCHARGE MEDICATIONS:   Allergies as of 03/04/2017      Reactions   Ezetimibe Other (See Comments)   ELEV LFTS   Fenofibrate Micronized Other (See Comments)   ELEV LFTS   Statins Other (See Comments)   Reaction: Pt says "it was showing up in her liver."     Sulfamethoxazole-trimethoprim Diarrhea      Medication List    STOP taking these medications   acidophilus Caps capsule   oxyCODONE-acetaminophen 5-325 MG tablet Commonly known as:  ROXICET     TAKE these medications   albuterol 108 (90 Base) MCG/ACT inhaler Commonly known as:  PROVENTIL HFA;VENTOLIN HFA Inhale 2 puffs into the lungs every 6 (six) hours as needed for wheezing or shortness of breath.   amLODipine 5 MG tablet Commonly known as:  NORVASC Take 1 tablet by mouth daily.   amoxicillin-clavulanate 875-125 MG tablet Commonly known as:  AUGMENTIN Take 1 tablet by mouth every 12 (twelve) hours.   aspirin EC 81 MG tablet Take 81 mg by mouth daily.   beta carotene w/minerals tablet Take 1 tablet by mouth every evening.   budesonide 3 MG 24 hr capsule Commonly known as:  ENTOCORT EC Take 3 mg by mouth daily. 3 dail2 weeks 2daily after   CALCIUM 600+D PLUS MINERALS PO Take 1 tablet by mouth 2 (two) times daily.   carvedilol 3.125 MG tablet Commonly known as:  COREG Take 1 tablet (3.125 mg total) by mouth 2 (two) times daily with a meal.   CRANBERRY PO Take 1 capsule by mouth every evening.   dorzolamide 2 % ophthalmic solution Commonly known as:  TRUSOPT Place 1 drop into both eyes daily.   enalapril 5 MG tablet Commonly known as:  VASOTEC Take 1 tablet (5 mg total) by mouth 2 (two) times daily. What changed:  how much to take   furosemide 20 MG tablet Commonly known as:  LASIX Take 1 tablet (20 mg total) by mouth 2 (two) times daily.   hydroxychloroquine 200 MG tablet Commonly known as:  PLAQUENIL Take 200 mg by mouth daily.   IMODIUM PO Take 1 tablet by mouth daily as needed (for diarrhea).   IRON-VITAMIN C PO Take 1 tablet by mouth every evening.   latanoprost 0.005 % ophthalmic solution Commonly known as:  XALATAN Place 1 drop into both eyes at bedtime.   pantoprazole 40 MG tablet Commonly known as:  PROTONIX Take 1 tablet by mouth  daily.   pravastatin 20 MG tablet Commonly known as:  PRAVACHOL Take 20 mg by mouth every evening.   predniSONE 10 MG (21) Tbpk tablet Commonly known as:  STERAPRED UNI-PAK 21 TAB Take 1 tablet (10 mg total) by mouth daily. Take 6 tablets by mouth for 1 day followed by  5 tablets by mouth for 1 day followed by  4 tablets by mouth for 1 day followed by  3 tablets by mouth for 1 day followed by  2 tablets by mouth for 1 day followed by  1 tablet by mouth for a day and stop        DISCHARGE INSTRUCTIONS:   Heart Failure Clinic appointment on March 11 2017 at 12:20pm with Darylene Price, [place]. Please call [phone] to reschedule.  Follow-up with primary care physician and cardiology [place] in 1 week   DIET:  Cardiac diet  DISCHARGE CONDITION:  Stable  ACTIVITY:  Activity as tolerated  OXYGEN:  Home Oxygen: No.   Oxygen Delivery:  room air  DISCHARGE LOCATION:  home   If you experience worsening of your admission symptoms, develop shortness of breath, life threatening emergency, suicidal or homicidal thoughts you must seek medical attention immediately by calling 911 or calling your MD immediately  if symptoms less severe.  You Must read complete instructions/literature along with all the possible adverse reactions/side effects for all the Medicines you take and that have been prescribed to you. Take any new Medicines after you have completely understood and accpet all the possible adverse reactions/side effects.   Please note  You were cared for by a hospitalist during your hospital stay. If you have any questions about your discharge medications or the care you received while you were in the hospital after you are discharged, you can call the unit and asked to speak with the hospitalist on call if the hospitalist that took care of you is not available. Once you are discharged, your primary care physician will handle any further medical issues. Please note that NO  REFILLS for any discharge medications will be authorized once you are discharged, as it is imperative that you return to your primary care physician (or establish a relationship with a primary care physician if you do not have one) for your aftercare needs so that they can reassess your need for medications and monitor your lab values.     Today  Chief Complaint  Patient presents with  . Shortness of Breath   Patient is feeling much better.  Denies any shortness of breath.  Agreeable with home health PT  ROS:  CONSTITUTIONAL: Denies fevers, chills. Denies any fatigue, weakness.  EYES: Denies blurry vision, double vision, eye pain. EARS, NOSE, THROAT: Denies tinnitus, ear pain, hearing loss. RESPIRATORY: Denies cough, wheeze, shortness of breath.  CARDIOVASCULAR: Denies chest pain, palpitations, edema.  GASTROINTESTINAL: Denies nausea, vomiting, diarrhea, abdominal pain. Denies bright red blood per rectum. GENITOURINARY: Denies dysuria, hematuria. ENDOCRINE: Denies nocturia or thyroid problems. HEMATOLOGIC AND LYMPHATIC: Denies easy bruising or bleeding. SKIN: Denies rash or lesion. MUSCULOSKELETAL: Denies pain in neck, back, shoulder, knees, hips or arthritic symptoms.  NEUROLOGIC: Denies paralysis, paresthesias.  PSYCHIATRIC: Denies anxiety or depressive symptoms.   VITAL SIGNS:  Blood pressure (!) 179/81, pulse 79, temperature 98.2 F (36.8 C), resp. rate 16, height 5\' 2"  (1.575 m), weight 46 kg (101 lb 6.4 oz), SpO2 95 %.  I/O:    Intake/Output Summary (Last 24 hours) at 03/04/2017 1242 Last data filed at 03/04/2017 0955 Gross per 24 hour  Intake 360 ml  Output 802 ml  Net -442 ml    PHYSICAL EXAMINATION:  GENERAL:  84 y.o.-year-old patient lying in the bed with no acute distress.  EYES: Pupils equal, round, reactive to light and accommodation. No scleral icterus. Extraocular muscles intact.  HEENT: Head atraumatic, normocephalic. Oropharynx and nasopharynx clear.   NECK:  Supple, no jugular venous distention. No thyroid enlargement, no tenderness.  LUNGS: Normal breath sounds bilaterally, no wheezing, rales,rhonchi or crepitation. No use of accessory muscles of respiration.  CARDIOVASCULAR: S1, S2 normal. No murmurs, rubs, or gallops.  ABDOMEN: Soft, non-tender, non-distended. Bowel sounds present. No organomegaly or mass.  EXTREMITIES: No pedal edema, cyanosis, or clubbing.  NEUROLOGIC: Cranial nerves II through XII are intact. Muscle strength 5/5 in all extremities. Sensation intact. Gait not checked.  PSYCHIATRIC: The patient is alert and oriented x 3.  SKIN: No obvious rash, lesion, or ulcer.   DATA REVIEW:   CBC Recent Labs  Lab 03/02/17 0901  WBC  12.1*  HGB 11.9*  HCT 35.9  PLT 232    Chemistries  Recent Labs  Lab 02/28/17 0426  03/04/17 0338  NA 138   < > 140  K 4.0   < > 4.1  CL 103   < > 104  CO2 25   < > 29  GLUCOSE 336*   < > 206*  BUN 23*   < > 64*  CREATININE 0.80   < > 1.16*  CALCIUM 9.4   < > 8.9  AST 24  --   --   ALT 13*  --   --   ALKPHOS 82  --   --   BILITOT 0.6  --   --    < > = values in this interval not displayed.    Cardiac Enzymes Recent Labs  Lab 02/28/17 0426  TROPONINI 0.03*    Microbiology Results  Results for orders placed or performed during the hospital encounter of 02/28/17  Culture, blood (routine x 2)     Status: None (Preliminary result)   Collection Time: 02/28/17  5:10 AM  Result Value Ref Range Status   Specimen Description BLOOD RIGHT FA  Final   Special Requests   Final    BOTTLES DRAWN AEROBIC AND ANAEROBIC Blood Culture adequate volume   Culture   Final    NO GROWTH 4 DAYS Performed at [facility], [address], [place], [place] [address]    Report Status PENDING  Incomplete  Culture, blood (routine x 2)     Status: None (Preliminary result)   Collection Time: 02/28/17  5:10 AM  Result Value Ref Range Status   Specimen Description BLOOD LEFT FA  Final    Special Requests   Final    BOTTLES DRAWN AEROBIC AND ANAEROBIC Blood Culture adequate volume   Culture   Final    NO GROWTH 4 DAYS Performed at [facility], [address] [place], [place] [address]    Report Status PENDING  Incomplete  MRSA PCR Screening     Status: None   Collection Time: 02/28/17  9:46 AM  Result Value Ref Range Status   MRSA by PCR NEGATIVE NEGATIVE Final    Comment:        The GeneXpert MRSA Assay (FDA approved for NASAL specimens only), is one component of a comprehensive MRSA colonization surveillance program. It is not intended to diagnose MRSA infection nor to guide or monitor treatment for MRSA infections. Performed at [facility], [address], [place],  [address]     RADIOLOGY:  No results found.  EKG:   Orders placed or performed during the hospital encounter of 02/28/17  . EKG 12-Lead  . EKG 12-Lead      Management plans discussed with the patient, family and they are in agreement.  CODE STATUS:     Code Status Orders  (From admission, onward)        Start     Ordered   02/28/17 1151  Do not attempt resuscitation (DNR)  Continuous    Question Answer Comment  In the event of cardiac or respiratory ARREST Do not call a "code blue"   In the event of cardiac or respiratory ARREST Do not perform Intubation, CPR, defibrillation or ACLS   In the event of cardiac or respiratory ARREST Use medication by any route, position, wound care, and other measures to relive pain and suffering. May use oxygen, suction and manual treatment of airway obstruction  as needed for comfort.      02/28/17 1151    Code Status History    Date Active Date Inactive Code Status Order ID Comments User Context   02/28/2017 08:01 02/28/2017 11:51 Full Code [id number]  Harrie Foreman, MD Inpatient   05/30/2015 11:35 06/03/2015 14:20 Full Code [id number]  Epifanio Lesches, MD ED   05/08/2015 07:00 05/12/2015 18:55 Full Code [id number]   Mikael Spray, NP ED   07/20/2014 22:30 07/23/2014 17:03 Full Code [id number]  Gladstone Lighter, MD ED      TOTAL TIME TAKING CARE OF THIS PATIENT: 45  minutes.   Note: This dictation was prepared with Dragon dictation along with smaller phrase technology. Any transcriptional errors that result from this process are unintentional.   @[facility] @  on 03/04/2017 at 12:42 PM  Between 7am to 6pm - Pager - [phone]  After 6pm go to [email or web address] - password [email or web address] [facility]  Office  [phone]  CC: Primary care physician; Dion Body, MD

## 2017-03-04 NOTE — Care Management Note (Signed)
Case Management Note  Patient Details  Name: Margaret Matthews MRN: [id number] Date of Birth: 1930-12-11  Subjective/Objective:  Admitted to [facility] with the diagnosis of CHF. Son Gwyndolyn Saxon lives in the home [phone]). Sees Dr. Netty Starring as primary care physician. [facility] past. [facility] 05/2015. No home oxygen. Uses a cane as needed. Takes care of all basic activities of daily living herself, drives.  Son will transport                  Action/Plan: Physical therapy evaluation completed. Recommended home with home health and therapy. Would like [facility] again.    Expected Discharge Date:  03/04/17               Expected Discharge Plan:     In-House Referral:    yes Discharge planning Services  CM Consult  Post Acute Care Choice:    Choice offered to:  Patient  DME Arranged:    DME Agency:     HH Arranged:   yes HH Agency:   [place]  Status of Service:  In process, will continue to follow  If discussed at Long Length of Stay Meetings, dates discussed:    Additional Comments:  [name], RN MSN CCM Care Management [phone] 03/04/2017, 9:56 AM

## 2017-03-04 NOTE — Care Management Important Message (Signed)
Important Message  Patient Details  Name: Margaret Matthews MRN: [id number] Date of Birth: [birth year]   Medicare Important Message Given:  Yes    [name], RN 03/04/2017, 9:38 AM

## 2017-03-04 NOTE — Discharge Instructions (Signed)
Heart Failure Clinic appointment on March 11 2017 at 12:20pm with Darylene Price, [place]. Please call [phone] to reschedule.  Follow-up with primary care physician and cardiology [place] in 1 week

## 2017-03-05 LAB — CULTURE, BLOOD (ROUTINE X 2)
CULTURE: NO GROWTH
Culture: NO GROWTH
Special Requests: ADEQUATE
Special Requests: ADEQUATE

## 2017-03-08 NOTE — Progress Notes (Signed)
[name], female    DOB: January 04, 1931, 84 y.o.   MRN: [id number]  HPI  Ms Barsamian is an 84 y/o female with a history of severe aortic stenosis, HTN, hyperlipidemia, colitis, GERD, breast cancer, anemia, hyponatremia and chronic heart failure.   Echo report from 03/03/17 reviewed and showed an EF of 55-60% along with critical AS, mild MR, mild/moderate TR and severely elevated PA pressure of 73 mm Hg.   Admitted 02/28/17 due to acute HF exacerbation. Initially needed bipap and IV diuretics. Transitioned to oral diuretics. Cardiology consult obtained. Discharged after 4 days.   She presents today for a follow-up visit although hasn't been seen in clinic since May 2017. She presents with a chief complaint of moderate fatigue with little exertion. She says this has been present for many months but is improving. She has associated shortness of breath and light-headedness along with this. She denies any chest pain, cough, edema, palpitations, abdominal distention, difficulty sleeping or weight gain.  Past Medical History:  Diagnosis Date  . Anemia   . Arthritis   . Breast cancer ([place])   . CHF (congestive heart failure) ([place])   . CKD (chronic kidney disease) stage 2, GFR 60-89 ml/min    baseline creatinine 1.5  . GERD (gastroesophageal reflux disease)   . Glaucoma   . History of nephrectomy    patient reports only one kidney  . Hypertension   . Hyponatremia   . Osteopenia   . PUD (peptic ulcer disease)   . Renal cell carcinoma [facility])    Past Surgical History:  Procedure Laterality Date  . BREAST SURGERY     Left mastectomy  . COLONOSCOPY    . HEMIARTHROPLASTY SHOULDER FRACTURE     Right shoulder  . HIP SURGERY     Left hip  . NEPHRECTOMY     Right nephrectomy   Family History  Problem Relation Age of Onset  . CAD Mother   . CVA Father   . CAD Sister    Social History   Tobacco Use  . Smoking status: Former Research scientist (life sciences)  . Smokeless tobacco: Never Used  Substance  Use Topics  . Alcohol use: No    Alcohol/week: 0.0 oz   Allergies  Allergen Reactions  . Ezetimibe Other (See Comments)    ELEV LFTS  . Fenofibrate Micronized Other (See Comments)    ELEV LFTS  . Statins Other (See Comments)    Reaction: Pt says "it was showing up in her liver."   . Sulfamethoxazole-Trimethoprim Diarrhea   Prior to Admission medications   Medication Sig Start Date End Date Taking? Authorizing Provider  acetaminophen (TYLENOL) 500 MG tablet Take 500 mg by mouth every 8 (eight) hours as needed for fever.   Yes [provider]  acidophilus (RISAQUAD) CAPS capsule Take 1 capsule by mouth 2 (two) times daily.   Yes [provider]  amLODipine (NORVASC) 5 MG tablet Take 1 tablet by mouth daily. 04/13/15  Yes [provider]  aspirin EC 81 MG tablet Take 81 mg by mouth daily.   Yes [provider]  beta carotene w/minerals (OCUVITE) tablet Take 1 tablet by mouth every evening.   Yes [provider]  Calcium Carbonate-Vit D-Min (CALCIUM 600+D PLUS MINERALS PO) Take 1 tablet by mouth 2 (two) times daily.    Yes [provider]  carvedilol (COREG) 3.125 MG tablet Take 1 tablet (3.125 mg total) by mouth 2 (two) times daily with a meal. 05/12/15  Yes Fritzi Mandes, MD  CRANBERRY PO Take 1 capsule by mouth every evening.   Yes [provider]  dorzolamide (TRUSOPT) 2 % ophthalmic solution Place 1 drop into both eyes daily. 05/12/15  Yes Fritzi Mandes, MD  enalapril (VASOTEC) 10 MG tablet Take 10 mg by mouth daily.   Yes [provider]  hydroxychloroquine (PLAQUENIL) 200 MG tablet Take 200 mg by mouth every other day.    Yes [provider]  latanoprost (XALATAN) 0.005 % ophthalmic solution Place 1 drop into both eyes at bedtime. 05/12/15  Yes Fritzi Mandes, MD  pantoprazole (PROTONIX) 40 MG tablet Take 1 tablet by mouth daily. 02/17/15  Yes [provider]  pravastatin (PRAVACHOL) 20 MG tablet Take 20 mg by  mouth every evening.   Yes [provider]  albuterol (PROVENTIL HFA;VENTOLIN HFA) 108 (90 Base) MCG/ACT inhaler Inhale 2 puffs into the lungs every 6 (six) hours as needed for wheezing or shortness of breath. Patient not taking: Reported on 03/12/2017 03/04/17   Nicholes Mango, MD  amoxicillin-clavulanate (AUGMENTIN) 875-125 MG tablet Take 1 tablet by mouth every 12 (twelve) hours. 03/04/17   Nicholes Mango, MD  furosemide (LASIX) 20 MG tablet Take 1 tablet (20 mg total) by mouth 2 (two) times daily. Patient not taking: Reported on 03/12/2017 03/04/17 03/04/18  Nicholes Mango, MD  IRON-VITAMIN C PO Take 1 tablet by mouth every evening.    [provider]  Loperamide HCl (IMODIUM PO) Take 1 tablet by mouth daily as needed (for diarrhea).     [provider]     Review of Systems  Constitutional: Positive for fatigue. Negative for appetite change.  HENT: Positive for hearing loss. Negative for congestion, postnasal drip and sore throat.   Eyes: Negative.   Respiratory: Positive for shortness of breath. Negative for chest tightness.   Cardiovascular: Negative for chest pain, palpitations and leg swelling.  Gastrointestinal: Negative for abdominal distention and abdominal pain.  Endocrine: Negative.   Genitourinary: Negative.   Musculoskeletal: Negative for back pain and neck pain.  Skin: Negative.   Allergic/Immunologic: Negative.   Neurological: Positive for light-headedness (at times). Negative for dizziness.  Hematological: Negative for adenopathy. Does not bruise/bleed easily.  Psychiatric/Behavioral: Negative for dysphoric mood and sleep disturbance (sleeping in recliner due to comfort). The patient is not nervous/anxious.    Vitals:   03/12/17 1000  BP: (!) 141/59  Pulse: 62  Resp: 18  SpO2: 100%  Weight: 102 lb 2 oz (46.3 kg)  Height: 5\' 1"  (1.549 m)   Wt Readings from Last 3 Encounters:  03/12/17 102 lb 2 oz (46.3 kg)  03/04/17 101 lb 6.4 oz (46 kg)  04/06/16  101 lb (45.8 kg)   Lab Results  Component Value Date   CREATININE 1.16 (H) 03/04/2017   CREATININE 1.56 (H) 03/03/2017   CREATININE 1.52 (H) 03/02/2017    Physical Exam  Constitutional: She is oriented to person, place, and time. She appears well-developed and well-nourished.  HENT:  Head: Normocephalic and atraumatic.  Right Ear: Decreased hearing is noted.  Left Ear: Decreased hearing is noted.  Neck: Normal range of motion. Neck supple. No JVD present.  Cardiovascular: Normal rate and regular rhythm.  Pulmonary/Chest: Effort normal. She has no wheezes. She has no rales.  Abdominal: Soft. She exhibits no distension. There is no tenderness.  Musculoskeletal: She exhibits no edema or tenderness.  Neurological: She is alert and oriented to person, place, and time.  Skin: Skin is warm and dry.  Psychiatric: She has a normal mood and affect. Her behavior is normal. Thought content normal.  Nursing note and vitals reviewed.   Assessment & Plan:  1: Chronic heart failure with preserved ejection fraction- - NYHA class III - euvolemic - not weighing daily as she needs to replace the batteries in her scales. Discussed the importance of weighing daily and to call for an overnight weight gain of >2 pounds or a weekly weight gain of >5 pounds - not adding salt and has been trying to read food labels. Discussed the importance of closely following a 2000mg  sodium diet - reminded to drink between 40-60 ounces of fluid daily - patient reports receiving her flu vaccine for this season - is quite active at home and has to walk up 13 steps to her bedroom and can do so with minimal shortness of breath although she does get tired. Once she rests for a few minutes, her breathing improves. Does walk using a cane - BNP on 02/28/17 was 1073.0  2: HTN- - BP looks good today - saw PCP ([place]) 03/08/17 and returns in 3 months - BMP from 03/04/17 reviewed and showed sodium 140, potassium 4.1 and GFR  42  3: Aortic stenosis- - saw cardiology Clayborn Bigness) 03/11/17 - cardiology is planning on making appointment at [facility] for possible valve repair (per patient's report)  Patient did not bring her medications nor a list. Each medication was verbally reviewed with the patient and she was encouraged to bring the bottles to every visit to confirm accuracy of list.  Return in 6 months or sooner for any questions/problems before then.

## 2017-03-11 ENCOUNTER — Ambulatory Visit: Payer: Medicare Other | Admitting: Family

## 2017-03-12 ENCOUNTER — Ambulatory Visit: Payer: Medicare Other | Attending: Family | Admitting: Family

## 2017-03-12 ENCOUNTER — Encounter: Payer: Self-pay | Admitting: Family

## 2017-03-12 VITALS — BP 141/59 | HR 62 | Resp 18 | Ht 61.0 in | Wt 102.1 lb

## 2017-03-12 DIAGNOSIS — K529 Noninfective gastroenteritis and colitis, unspecified: Secondary | ICD-10-CM | POA: Diagnosis not present

## 2017-03-12 DIAGNOSIS — M8580 Other specified disorders of bone density and structure, unspecified site: Secondary | ICD-10-CM | POA: Insufficient documentation

## 2017-03-12 DIAGNOSIS — Z823 Family history of stroke: Secondary | ICD-10-CM | POA: Diagnosis not present

## 2017-03-12 DIAGNOSIS — Z853 Personal history of malignant neoplasm of breast: Secondary | ICD-10-CM | POA: Diagnosis not present

## 2017-03-12 DIAGNOSIS — I350 Nonrheumatic aortic (valve) stenosis: Secondary | ICD-10-CM | POA: Diagnosis not present

## 2017-03-12 DIAGNOSIS — D649 Anemia, unspecified: Secondary | ICD-10-CM | POA: Insufficient documentation

## 2017-03-12 DIAGNOSIS — Z8711 Personal history of peptic ulcer disease: Secondary | ICD-10-CM | POA: Insufficient documentation

## 2017-03-12 DIAGNOSIS — E785 Hyperlipidemia, unspecified: Secondary | ICD-10-CM | POA: Diagnosis not present

## 2017-03-12 DIAGNOSIS — Z905 Acquired absence of kidney: Secondary | ICD-10-CM | POA: Diagnosis not present

## 2017-03-12 DIAGNOSIS — H409 Unspecified glaucoma: Secondary | ICD-10-CM | POA: Insufficient documentation

## 2017-03-12 DIAGNOSIS — R42 Dizziness and giddiness: Secondary | ICD-10-CM | POA: Diagnosis not present

## 2017-03-12 DIAGNOSIS — Z8249 Family history of ischemic heart disease and other diseases of the circulatory system: Secondary | ICD-10-CM | POA: Insufficient documentation

## 2017-03-12 DIAGNOSIS — K219 Gastro-esophageal reflux disease without esophagitis: Secondary | ICD-10-CM | POA: Insufficient documentation

## 2017-03-12 DIAGNOSIS — E871 Hypo-osmolality and hyponatremia: Secondary | ICD-10-CM | POA: Diagnosis not present

## 2017-03-12 DIAGNOSIS — I10 Essential (primary) hypertension: Secondary | ICD-10-CM

## 2017-03-12 DIAGNOSIS — Z23 Encounter for immunization: Secondary | ICD-10-CM | POA: Insufficient documentation

## 2017-03-12 DIAGNOSIS — Z79899 Other long term (current) drug therapy: Secondary | ICD-10-CM | POA: Insufficient documentation

## 2017-03-12 DIAGNOSIS — I5032 Chronic diastolic (congestive) heart failure: Secondary | ICD-10-CM | POA: Insufficient documentation

## 2017-03-12 DIAGNOSIS — Z7982 Long term (current) use of aspirin: Secondary | ICD-10-CM | POA: Insufficient documentation

## 2017-03-12 DIAGNOSIS — Z87891 Personal history of nicotine dependence: Secondary | ICD-10-CM | POA: Insufficient documentation

## 2017-03-12 DIAGNOSIS — R0602 Shortness of breath: Secondary | ICD-10-CM | POA: Insufficient documentation

## 2017-03-12 DIAGNOSIS — I110 Hypertensive heart disease with heart failure: Secondary | ICD-10-CM | POA: Diagnosis not present

## 2017-03-12 DIAGNOSIS — Z85528 Personal history of other malignant neoplasm of kidney: Secondary | ICD-10-CM | POA: Insufficient documentation

## 2017-03-12 NOTE — Patient Instructions (Signed)
Continue weighing daily and call for an overnight weight gain of > 2 pounds or a weekly weight gain of >5 pounds. 

## 2017-07-01 ENCOUNTER — Emergency Department: Payer: Medicare Other

## 2017-07-01 ENCOUNTER — Emergency Department
Admission: EM | Admit: 2017-07-01 | Discharge: 2017-07-01 | Disposition: A | Discharge status: Home / Self Care | Payer: Medicare Other | Attending: Emergency Medicine | Admitting: Emergency Medicine

## 2017-07-01 ENCOUNTER — Encounter: Payer: Self-pay | Admitting: Emergency Medicine

## 2017-07-01 DIAGNOSIS — R1084 Generalized abdominal pain: Secondary | ICD-10-CM | POA: Insufficient documentation

## 2017-07-01 DIAGNOSIS — Z7982 Long term (current) use of aspirin: Secondary | ICD-10-CM | POA: Diagnosis not present

## 2017-07-01 DIAGNOSIS — I130 Hypertensive heart and chronic kidney disease with heart failure and stage 1 through stage 4 chronic kidney disease, or unspecified chronic kidney disease: Secondary | ICD-10-CM | POA: Insufficient documentation

## 2017-07-01 DIAGNOSIS — I509 Heart failure, unspecified: Secondary | ICD-10-CM | POA: Insufficient documentation

## 2017-07-01 DIAGNOSIS — Z87891 Personal history of nicotine dependence: Secondary | ICD-10-CM | POA: Diagnosis not present

## 2017-07-01 DIAGNOSIS — Z79899 Other long term (current) drug therapy: Secondary | ICD-10-CM | POA: Insufficient documentation

## 2017-07-01 DIAGNOSIS — Z853 Personal history of malignant neoplasm of breast: Secondary | ICD-10-CM | POA: Diagnosis not present

## 2017-07-01 DIAGNOSIS — R197 Diarrhea, unspecified: Secondary | ICD-10-CM | POA: Insufficient documentation

## 2017-07-01 DIAGNOSIS — N182 Chronic kidney disease, stage 2 (mild): Secondary | ICD-10-CM | POA: Insufficient documentation

## 2017-07-01 LAB — GASTROINTESTINAL PANEL BY PCR, STOOL (REPLACES STOOL CULTURE)

## 2017-07-01 LAB — COMPREHENSIVE METABOLIC PANEL
ALBUMIN: 2.6 g/dL — AB (ref 3.5–5.0)
ALK PHOS: 97 U/L (ref 38–126)
ALT: 39 U/L (ref 14–54)
AST: 55 U/L — AB (ref 15–41)
Anion gap: 4 — ABNORMAL LOW (ref 5–15)
BILIRUBIN TOTAL: 0.7 mg/dL (ref 0.3–1.2)
BUN: 30 mg/dL — AB (ref 6–20)
CALCIUM: 8.6 mg/dL — AB (ref 8.9–10.3)
CO2: 27 mmol/L (ref 22–32)
CREATININE: 0.98 mg/dL (ref 0.44–1.00)
Chloride: 107 mmol/L (ref 101–111)
GFR calc Af Amer: 59 mL/min — ABNORMAL LOW (ref >60)
GFR calc non Af Amer: 51 mL/min — ABNORMAL LOW (ref >60)
GLUCOSE: 98 mg/dL (ref 65–99)
Potassium: 4.5 mmol/L (ref 3.5–5.1)
Sodium: 138 mmol/L (ref 135–145)
TOTAL PROTEIN: 6.2 g/dL — AB (ref 6.5–8.1)

## 2017-07-01 LAB — URINALYSIS, COMPLETE (UACMP) WITH MICROSCOPIC
Bilirubin Urine: NEGATIVE
Glucose, UA: NEGATIVE mg/dL
Hgb urine dipstick: NEGATIVE
Ketones, ur: NEGATIVE mg/dL
Leukocytes, UA: NEGATIVE
NITRITE: NEGATIVE
PH: 5 (ref 5.0–8.0)
Protein, ur: 30 mg/dL — AB
Specific Gravity, Urine: 1.041 — ABNORMAL HIGH (ref 1.005–1.030)

## 2017-07-01 LAB — CBC
HEMATOCRIT: 34.6 % — AB (ref 35.0–47.0)
Hemoglobin: 11.8 g/dL — ABNORMAL LOW (ref 12.0–16.0)
MCH: 31.5 pg (ref 26.0–34.0)
MCHC: 34.2 g/dL (ref 32.0–36.0)
MCV: 92 fL (ref 80.0–100.0)
PLATELETS: 175 10*3/uL (ref 150–440)
RBC: 3.76 MIL/uL — ABNORMAL LOW (ref 3.80–5.20)
RDW: 16.6 % — AB (ref 11.5–14.5)
WBC: 6.2 10*3/uL (ref 3.6–11.0)

## 2017-07-01 LAB — C DIFFICILE QUICK SCREEN W PCR REFLEX
C DIFFICILE (CDIFF) INTERP: NOT DETECTED
C Diff antigen: NEGATIVE
C Diff toxin: NEGATIVE

## 2017-07-01 LAB — LIPASE, BLOOD: Lipase: 53 U/L — ABNORMAL HIGH (ref 11–51)

## 2017-07-01 LAB — LACTIC ACID, PLASMA: Lactic Acid, Venous: 1.3 mmol/L (ref 0.5–1.9)

## 2017-07-01 MED ORDER — IOPAMIDOL (ISOVUE-300) INJECTION 61%
75.0000 mL | Freq: Once | INTRAVENOUS | Status: AC | PRN
  Administered 2017-07-01: 75 mL via INTRAVENOUS

## 2017-07-01 MED ORDER — SODIUM CHLORIDE 0.9 % IV BOLUS
1000.0000 mL | Freq: Once | INTRAVENOUS | Status: AC
  Administered 2017-07-01: 1000 mL via INTRAVENOUS

## 2017-07-01 NOTE — ED Triage Notes (Addendum)
Pt comes into the ED via POV c/o diarrhea that started after her PCP placed her on antibiotics for pneumonia.  Patient states she has diarrhea often and will get cramping in her abdomen when it occurs.  Patient in NAd at this time with even and unlabored respirations.  Patient is currently a renal cancer patient and has a port. Patient unable to go to chemo due to not feeling well with the diarrhea.  Patient has been taking imodium at home with no relief.

## 2017-07-01 NOTE — ED Provider Notes (Signed)
[facility] Emergency Department Provider Note  ____________________________________________   None    (approximate)  I have reviewed the triage vital signs and the nursing notes.   HISTORY  Chief Complaint Diarrhea   HPI Margaret Matthews is a 84 y.o. female who comes to the emergency department with roughly 2 weeks of daily diarrhea.  She has mild to moderate cramping diffuse abdominal pain worse when defecating and improved when not.  She has had roughly 5-10 loose watery stools a day for the past 2 weeks.  Apparently she was recently admitted to the hospital for pneumonia and yesterday completed a course of oral antibiotics.  She has had no subsequent fever or chills at home.  No cough or shortness of breath.  She does have a long-standing history of chronic collagenous colitis and about 1 month ago stopped taking her budesonide.  Her symptoms are now moderate severity intermittent daily.  Nothing in particular seems to make it better or worse.  Past Medical History:  Diagnosis Date  . Anemia   . Arthritis   . Breast cancer ([place])   . CHF (congestive heart failure) ([place])   . CKD (chronic kidney disease) stage 2, GFR 60-89 ml/min    baseline creatinine 1.5  . GERD (gastroesophageal reflux disease)   . Glaucoma   . History of nephrectomy    patient reports only one kidney  . Hypertension   . Hyponatremia   . Osteopenia   . PUD (peptic ulcer disease)   . Renal cell carcinoma [facility])     Patient Active Problem List   Diagnosis Date Noted  . Aortic stenosis 03/12/2017  . Acute systolic CHF (congestive heart failure) ([place]) 02/28/2017  . HTN (hypertension) 06/15/2015  . Anxiety 06/15/2015  . Hyponatremia with decreased serum osmolality 05/30/2015  . CHF (congestive heart failure) ([place]) 05/08/2015    Past Surgical History:  Procedure Laterality Date  . BREAST SURGERY     Left mastectomy  . COLONOSCOPY    . HEMIARTHROPLASTY SHOULDER FRACTURE     Right shoulder  . HIP SURGERY     Left hip  . NEPHRECTOMY     Right nephrectomy    Prior to Admission medications   Medication Sig Start Date End Date Taking? Authorizing Provider  acetaminophen (TYLENOL) 500 MG tablet Take 500 mg by mouth every 8 (eight) hours as needed for fever.    [provider]  acidophilus (RISAQUAD) CAPS capsule Take 1 capsule by mouth 2 (two) times daily.    [provider]  albuterol (PROVENTIL HFA;VENTOLIN HFA) 108 (90 Base) MCG/ACT inhaler Inhale 2 puffs into the lungs every 6 (six) hours as needed for wheezing or shortness of breath. Patient not taking: Reported on 03/12/2017 03/04/17   Nicholes Mango, MD  amLODipine (NORVASC) 5 MG tablet Take 1 tablet by mouth daily. 04/13/15   [provider]  amoxicillin-clavulanate (AUGMENTIN) 875-125 MG tablet Take 1 tablet by mouth every 12 (twelve) hours. 03/04/17   Nicholes Mango, MD  aspirin EC 81 MG tablet Take 81 mg by mouth daily.    [provider]  beta carotene w/minerals (OCUVITE) tablet Take 1 tablet by mouth every evening.    [provider]  Calcium Carbonate-Vit D-Min (CALCIUM 600+D PLUS MINERALS PO) Take 1 tablet by mouth 2 (two) times daily.     [provider]  carvedilol (COREG) 3.125 MG tablet Take 1 tablet (3.125 mg total) by mouth 2 (two) times daily with a meal. 05/12/15  Fritzi Mandes, MD  CRANBERRY PO Take 1 capsule by mouth every evening.    [provider]  dorzolamide (TRUSOPT) 2 % ophthalmic solution Place 1 drop into both eyes daily. 05/12/15   Fritzi Mandes, MD  enalapril (VASOTEC) 10 MG tablet Take 10 mg by mouth daily.    [provider]  furosemide (LASIX) 20 MG tablet Take 1 tablet (20 mg total) by mouth 2 (two) times daily. Patient not taking: Reported on 03/12/2017 03/04/17 03/04/18  Nicholes Mango, MD  hydroxychloroquine (PLAQUENIL) 200 MG tablet Take 200 mg by mouth every other day.     [provider]  IRON-VITAMIN C PO Take 1  tablet by mouth every evening.    [provider]  latanoprost (XALATAN) 0.005 % ophthalmic solution Place 1 drop into both eyes at bedtime. 05/12/15   Fritzi Mandes, MD  Loperamide HCl (IMODIUM PO) Take 1 tablet by mouth daily as needed (for diarrhea).     [provider]  pantoprazole (PROTONIX) 40 MG tablet Take 1 tablet by mouth daily. 02/17/15   [provider]  pravastatin (PRAVACHOL) 20 MG tablet Take 20 mg by mouth every evening.    [provider]    Allergies Ezetimibe; Fenofibrate micronized; Statins; and Sulfamethoxazole-trimethoprim  Family History  Problem Relation Age of Onset  . CAD Mother   . CVA Father   . CAD Sister     Social History Social History   Tobacco Use  . Smoking status: Former Research scientist (life sciences)  . Smokeless tobacco: Never Used  Substance Use Topics  . Alcohol use: No    Alcohol/week: 0.0 oz  . Drug use: No    Review of Systems Constitutional: No fever/chills Eyes: No visual changes. ENT: No sore throat. Cardiovascular: Denies chest pain. Respiratory: Denies shortness of breath. Gastrointestinal: Positive for abdominal pain.  No nausea, no vomiting.  Positive for diarrhea.  No constipation. Genitourinary: Negative for dysuria. Musculoskeletal: Negative for back pain. Skin: Negative for rash. Neurological: Negative for headaches, focal weakness or numbness.   ____________________________________________   PHYSICAL EXAM:  VITAL SIGNS: ED Triage Vitals  Enc Vitals Group     BP 07/01/17 1442 140/62     Pulse Rate 07/01/17 1442 63     Resp 07/01/17 1442 16     Temp 07/01/17 1442 97.6 F (36.4 C)     Temp Source 07/01/17 1442 Axillary     SpO2 07/01/17 1442 97 %     Weight 07/01/17 1441 102 lb (46.3 kg)     Height 07/01/17 1441 5\' 3"  (1.6 m)     Head Circumference --      Peak Flow --      Pain Score 07/01/17 1440 0     Pain Loc --      Pain Edu? --      Excl. in [place]? --     Constitutional: Alert and  oriented x4 chronically ill-appearing although with no acute distress Eyes: PERRL EOMI. Head: Atraumatic. Nose: No congestion/rhinnorhea. Mouth/Throat: No trismus Neck: No stridor.   Cardiovascular: Normal rate, regular rhythm. Grossly normal heart sounds.  Good peripheral circulation. Respiratory: Normal respiratory effort.  No retractions. Lungs CTAB and moving good air Gastrointestinal: Soft mild diffuse tenderness no rebound or guarding no peritonitis Musculoskeletal: No lower extremity edema   Neurologic:  Normal speech and language. No gross focal neurologic deficits are appreciated. Skin:  Skin is warm, dry and intact. No rash noted. Psychiatric: Mood and affect are normal. Speech and behavior are  normal.    ____________________________________________   DIFFERENTIAL includes but not limited to  C. difficile colitis, chronic colitis, infectious colitis, small bowel obstruction, large bowel obstruction, volvulus ____________________________________________   LABS (all labs ordered are listed, but only abnormal results are displayed)  Labs Reviewed  LIPASE, BLOOD - Abnormal; Notable for the following components:      Result Value   Lipase 53 (*)    All other components within normal limits  COMPREHENSIVE METABOLIC PANEL - Abnormal; Notable for the following components:   BUN 30 (*)    Calcium 8.6 (*)    Total Protein 6.2 (*)    Albumin 2.6 (*)    AST 55 (*)    GFR calc non Af Amer 51 (*)    GFR calc Af Amer 59 (*)    Anion gap 4 (*)    All other components within normal limits  CBC - Abnormal; Notable for the following components:   RBC 3.76 (*)    Hemoglobin 11.8 (*)    HCT 34.6 (*)    RDW 16.6 (*)    All other components within normal limits  URINALYSIS, COMPLETE (UACMP) WITH MICROSCOPIC - Abnormal; Notable for the following components:   Color, Urine YELLOW (*)    APPearance CLEAR (*)    Specific Gravity, Urine 1.041 (*)    Protein, ur 30 (*)    Bacteria,  UA RARE (*)    All other components within normal limits  C DIFFICILE QUICK SCREEN W PCR REFLEX  GASTROINTESTINAL PANEL BY PCR, STOOL (REPLACES STOOL CULTURE)  LACTIC ACID, PLASMA    Lab work reviewed by me roughly at the patient's baseline.  Low albumin suggestive of chronically poor nutrition __________________________________________  EKG  ED ECG REPORT I, Darel Hong, the attending physician, personally viewed and interpreted this ECG.  Date: 07/01/2017 EKG Time:  Rate: 62 Rhythm: normal sinus rhythm QRS Axis: normal Intervals: normal ST/T Wave abnormalities: Left ventricular hypertrophy with normal repolarization abnormalities Narrative Interpretation: no evidence of acute ischemia  ____________________________________________  RADIOLOGY  CT abdomen pelvis reviewed by me consistent with diarrheal state ____________________________________________   PROCEDURES  Procedure(s) performed: no  Procedures  Critical Care performed: no  Observation: no ____________________________________________   INITIAL IMPRESSION / ASSESSMENT AND PLAN / ED COURSE  Pertinent labs & imaging results that were available during my care of the patient were reviewed by me and considered in my medical decision making (see chart for details).  The patient arrives relatively well-appearing although her history of frequent loose watery stools in the setting of recent antibiotic use raises concern for C. difficile.  Fluids and CT scan are pending.  ----------------------------------------- 8:48 PM on 07/01/2017 -----------------------------------------  The patient's CT shows diarrheal state.  We have attempted to get a stool sample for about 4 hours and she is been unable to provide one.  She is afebrile with a benign abdomen and her vital signs normalized.  I do not suspect C. difficile at this time.  I had a lengthy discussion with the son and apparently the patient has  inadvertently stopped taking her budesonide.  On chart review she was never supposed to stop.  I have encouraged him to restart and follow-up with GI this coming week for recheck.  Strict return precautions have been given and the patient and son verbalized understanding agreement the plan.      ____________________________________________   FINAL CLINICAL IMPRESSION(S) / ED DIAGNOSES  Final diagnoses:  Diarrhea, unspecified type  NEW MEDICATIONS STARTED DURING THIS VISIT:  New Prescriptions   No medications on file     Note:  This document was prepared using Dragon voice recognition software and may include unintentional dictation errors.     Darel Hong, MD 07/01/17 2049

## 2017-07-01 NOTE — Discharge Instructions (Signed)
Please resume your Budesonide and follow up with your GI within 1 week for a recheck.  Return to the ED for any concerns.  It was a pleasure to take care of you today, and thank you for coming to our emergency department.  If you have any questions or concerns before leaving please ask the nurse to grab me and I'm more than happy to go through your aftercare instructions again.  If you were prescribed any opioid pain medication today such as Norco, Vicodin, Percocet, morphine, hydrocodone, or oxycodone please make sure you do not drive when you are taking this medication as it can alter your ability to drive safely.  If you have any concerns once you are home that you are not improving or are in fact getting worse before you can make it to your follow-up appointment, please do not hesitate to call 911 and come back for further evaluation.  Darel Hong, MD  Results for orders placed or performed during the hospital encounter of 07/01/17  Lipase, blood  Result Value Ref Range   Lipase 53 (H) 11 - 51 U/L  Comprehensive metabolic panel  Result Value Ref Range   Sodium 138 135 - 145 mmol/L   Potassium 4.5 3.5 - 5.1 mmol/L   Chloride 107 101 - 111 mmol/L   CO2 27 22 - 32 mmol/L   Glucose, Bld 98 65 - 99 mg/dL   BUN 30 (H) 6 - 20 mg/dL   Creatinine, Ser 0.98 0.44 - 1.00 mg/dL   Calcium 8.6 (L) 8.9 - 10.3 mg/dL   Total Protein 6.2 (L) 6.5 - 8.1 g/dL   Albumin 2.6 (L) 3.5 - 5.0 g/dL   AST 55 (H) 15 - 41 U/L   ALT 39 14 - 54 U/L   Alkaline Phosphatase 97 38 - 126 U/L   Total Bilirubin 0.7 0.3 - 1.2 mg/dL   GFR calc non Af Amer 51 (L) >60 mL/min   GFR calc Af Amer 59 (L) >60 mL/min   Anion gap 4 (L) 5 - 15  CBC  Result Value Ref Range   WBC 6.2 3.6 - 11.0 K/uL   RBC 3.76 (L) 3.80 - 5.20 MIL/uL   Hemoglobin 11.8 (L) 12.0 - 16.0 g/dL   HCT 34.6 (L) 35.0 - 47.0 %   MCV 92.0 80.0 - 100.0 fL   MCH 31.5 26.0 - 34.0 pg   MCHC 34.2 32.0 - 36.0 g/dL   RDW 16.6 (H) 11.5 - 14.5 %   Platelets  175 150 - 440 K/uL  Urinalysis, Complete w Microscopic  Result Value Ref Range   Color, Urine YELLOW (A) YELLOW   APPearance CLEAR (A) CLEAR   Specific Gravity, Urine 1.041 (H) 1.005 - 1.030   pH 5.0 5.0 - 8.0   Glucose, UA NEGATIVE NEGATIVE mg/dL   Hgb urine dipstick NEGATIVE NEGATIVE   Bilirubin Urine NEGATIVE NEGATIVE   Ketones, ur NEGATIVE NEGATIVE mg/dL   Protein, ur 30 (A) NEGATIVE mg/dL   Nitrite NEGATIVE NEGATIVE   Leukocytes, UA NEGATIVE NEGATIVE   RBC / HPF 0-5 0 - 5 RBC/hpf   WBC, UA 0-5 0 - 5 WBC/hpf   Bacteria, UA RARE (A) NONE SEEN   Squamous Epithelial / LPF 0-5 0 - 5   Mucus PRESENT    Hyaline Casts, UA PRESENT   Lactic acid, plasma  Result Value Ref Range   Lactic Acid, Venous 1.3 0.5 - 1.9 mmol/L   Ct Abdomen Pelvis W Contrast  Result Date:  07/01/2017 CLINICAL DATA:  Diarrhea after starting antibiotics for pneumonia. Recent right nephrectomy for renal cell carcinoma. EXAM: CT ABDOMEN AND PELVIS WITH CONTRAST TECHNIQUE: Multidetector CT imaging of the abdomen and pelvis was performed using the standard protocol following bolus administration of intravenous contrast. CONTRAST:  39mL ISOVUE-300 IOPAMIDOL (ISOVUE-300) INJECTION 61% COMPARISON:  12/27/2004. FINDINGS: Lower chest: Atheromatous calcifications, including the coronary arteries and aorta. Enlarged heart, primarily due to biatrial enlargement. Small bilateral pleural effusions. Mild bilateral dependent atelectasis. Hepatobiliary: Dilated gallbladder containing multiple tiny stones with 1 larger stone measuring 8 mm. Mild amount of pericholecystic fluid. Mild intrahepatic biliary ductal dilatation. No visible common duct stone. Pancreas: Unremarkable. No pancreatic ductal dilatation or surrounding inflammatory changes. Spleen: Normal in size without focal abnormality. Adrenals/Urinary Tract: Interval large bilateral heterogeneously enhancing adrenal masses. The mass on the left measures 4.5 x 4.2 cm on image number 31  series 2 in the mass on the right measures 4.7 x 2.9 cm on image number 22 series 2. Surgically absent right kidney. Normal appearing left kidney and urinary bladder. No left ureteral abnormality visualized. Stomach/Bowel: Normal caliber fluid-filled colon. No evidence of appendicitis. Unremarkable stomach and small bowel. Vascular/Lymphatic: Atheromatous arterial calcifications without aneurysm. No enlarged lymph nodes. Reproductive: Uterus is small and poorly visualized. No adnexal masses. Other: Small amount of free peritoneal fluid. Musculoskeletal: Mild lumbar and lower thoracic spine degenerative changes. Approximately 10% T12 superior endplate compression deformity with no acute fracture lines seen and minimal bony retropulsion or spur formation. Circumscribed, mixed low density and calcific density lesion in the left femoral greater trochanter, measuring 4.6 x 3.3 cm on coronal image number 43. Mild to moderate levoconvex thoracolumbar scoliosis. Status post left mastectomy. IMPRESSION: 1. Fluid-filled normal caliber colon, compatible with the diarrhea reported by history. 2. Interval large bilateral heterogeneously enhancing adrenal masses compatible with adrenal metastases. 3. Small amount of free peritoneal fluid. 4. Cholelithiasis with dilation of the gallbladder and mild pericholecystic fluid. These could represent chronic changes. However, acute cholecystitis cannot be excluded. 5. 4.6 cm nonaggressive proximal left femoral lesion. Electronically Signed   By: Claudie Revering M.D.   On: 07/01/2017 16:58

## 2017-07-01 NOTE — ED Notes (Signed)
[facility] EMP informed RN pt was near syncope in bathroom. Called immediately for room.

## 2017-07-01 NOTE — ED Triage Notes (Signed)
FIRST NURSE NOTE-CA pt here for diarrhea after starting abx for PNA. NAD

## 2017-08-20 ENCOUNTER — Emergency Department: Payer: Medicare Other

## 2017-08-20 ENCOUNTER — Inpatient Hospital Stay
Admission: EM | Admit: 2017-08-20 | Discharge: 2017-08-22 | DRG: 557 | Disposition: A | Discharge status: Skilled Nursing Facility | Payer: Medicare Other | Attending: Internal Medicine | Admitting: Internal Medicine

## 2017-08-20 ENCOUNTER — Other Ambulatory Visit: Payer: Self-pay

## 2017-08-20 DIAGNOSIS — Z87891 Personal history of nicotine dependence: Secondary | ICD-10-CM

## 2017-08-20 DIAGNOSIS — Z9012 Acquired absence of left breast and nipple: Secondary | ICD-10-CM | POA: Diagnosis not present

## 2017-08-20 DIAGNOSIS — R197 Diarrhea, unspecified: Secondary | ICD-10-CM

## 2017-08-20 DIAGNOSIS — R627 Adult failure to thrive: Secondary | ICD-10-CM

## 2017-08-20 DIAGNOSIS — I5022 Chronic systolic (congestive) heart failure: Secondary | ICD-10-CM | POA: Diagnosis present

## 2017-08-20 DIAGNOSIS — Z9221 Personal history of antineoplastic chemotherapy: Secondary | ICD-10-CM | POA: Diagnosis not present

## 2017-08-20 DIAGNOSIS — K219 Gastro-esophageal reflux disease without esophagitis: Secondary | ICD-10-CM | POA: Diagnosis present

## 2017-08-20 DIAGNOSIS — Z79899 Other long term (current) drug therapy: Secondary | ICD-10-CM

## 2017-08-20 DIAGNOSIS — Z681 Body mass index (BMI) 19 or less, adult: Secondary | ICD-10-CM

## 2017-08-20 DIAGNOSIS — Z905 Acquired absence of kidney: Secondary | ICD-10-CM | POA: Diagnosis not present

## 2017-08-20 DIAGNOSIS — N182 Chronic kidney disease, stage 2 (mild): Secondary | ICD-10-CM | POA: Diagnosis present

## 2017-08-20 DIAGNOSIS — Z882 Allergy status to sulfonamides status: Secondary | ICD-10-CM | POA: Diagnosis not present

## 2017-08-20 DIAGNOSIS — K529 Noninfective gastroenteritis and colitis, unspecified: Secondary | ICD-10-CM | POA: Diagnosis present

## 2017-08-20 DIAGNOSIS — Z888 Allergy status to other drugs, medicaments and biological substances status: Secondary | ICD-10-CM

## 2017-08-20 DIAGNOSIS — N179 Acute kidney failure, unspecified: Secondary | ICD-10-CM | POA: Diagnosis present

## 2017-08-20 DIAGNOSIS — E860 Dehydration: Secondary | ICD-10-CM | POA: Diagnosis present

## 2017-08-20 DIAGNOSIS — C649 Malignant neoplasm of unspecified kidney, except renal pelvis: Secondary | ICD-10-CM | POA: Diagnosis present

## 2017-08-20 DIAGNOSIS — E43 Unspecified severe protein-calorie malnutrition: Secondary | ICD-10-CM

## 2017-08-20 DIAGNOSIS — M6282 Rhabdomyolysis: Secondary | ICD-10-CM | POA: Diagnosis present

## 2017-08-20 DIAGNOSIS — H409 Unspecified glaucoma: Secondary | ICD-10-CM | POA: Diagnosis present

## 2017-08-20 DIAGNOSIS — I130 Hypertensive heart and chronic kidney disease with heart failure and stage 1 through stage 4 chronic kidney disease, or unspecified chronic kidney disease: Secondary | ICD-10-CM | POA: Diagnosis present

## 2017-08-20 DIAGNOSIS — Z7982 Long term (current) use of aspirin: Secondary | ICD-10-CM

## 2017-08-20 DIAGNOSIS — M8580 Other specified disorders of bone density and structure, unspecified site: Secondary | ICD-10-CM | POA: Diagnosis present

## 2017-08-20 LAB — URINALYSIS, COMPLETE (UACMP) WITH MICROSCOPIC
Bacteria, UA: NONE SEEN
Bilirubin Urine: NEGATIVE
Glucose, UA: NEGATIVE mg/dL
Ketones, ur: NEGATIVE mg/dL
LEUKOCYTES UA: NEGATIVE
NITRITE: NEGATIVE
Protein, ur: 100 mg/dL — AB
SPECIFIC GRAVITY, URINE: 1.019 (ref 1.005–1.030)
SQUAMOUS EPITHELIAL / LPF: NONE SEEN (ref 0–5)
pH: 5 (ref 5.0–8.0)

## 2017-08-20 LAB — COMPREHENSIVE METABOLIC PANEL
ALK PHOS: 86 U/L (ref 38–126)
ALT: 105 U/L — ABNORMAL HIGH (ref 0–44)
ANION GAP: 11 (ref 5–15)
AST: 280 U/L — ABNORMAL HIGH (ref 15–41)
Albumin: 2.2 g/dL — ABNORMAL LOW (ref 3.5–5.0)
BUN: 25 mg/dL — ABNORMAL HIGH (ref 8–23)
CALCIUM: 8.1 mg/dL — AB (ref 8.9–10.3)
CO2: 27 mmol/L (ref 22–32)
Chloride: 100 mmol/L (ref 98–111)
Creatinine, Ser: 1.54 mg/dL — ABNORMAL HIGH (ref 0.44–1.00)
GFR, EST AFRICAN AMERICAN: 34 mL/min — AB (ref >60)
GFR, EST NON AFRICAN AMERICAN: 30 mL/min — AB (ref >60)
GLUCOSE: 80 mg/dL (ref 70–99)
Potassium: 3.2 mmol/L — ABNORMAL LOW (ref 3.5–5.1)
Sodium: 138 mmol/L (ref 135–145)
Total Bilirubin: 0.9 mg/dL (ref 0.3–1.2)
Total Protein: 5.3 g/dL — ABNORMAL LOW (ref 6.5–8.1)

## 2017-08-20 LAB — CBC WITH DIFFERENTIAL/PLATELET
Basophils Absolute: 0 10*3/uL (ref 0–0.1)
Basophils Relative: 1 %
Eosinophils Absolute: 0 10*3/uL (ref 0–0.7)
Eosinophils Relative: 0 %
HEMATOCRIT: 32.1 % — AB (ref 35.0–47.0)
HEMOGLOBIN: 11.3 g/dL — AB (ref 12.0–16.0)
LYMPHS ABS: 0.6 10*3/uL — AB (ref 1.0–3.6)
Lymphocytes Relative: 10 %
MCH: 34.1 pg — AB (ref 26.0–34.0)
MCHC: 35.2 g/dL (ref 32.0–36.0)
MCV: 96.9 fL (ref 80.0–100.0)
MONO ABS: 0.4 10*3/uL (ref 0.2–0.9)
MONOS PCT: 6 %
NEUTROS ABS: 5.6 10*3/uL (ref 1.4–6.5)
NEUTROS PCT: 83 %
Platelets: 165 10*3/uL (ref 150–440)
RBC: 3.31 MIL/uL — ABNORMAL LOW (ref 3.80–5.20)
RDW: 21.5 % — AB (ref 11.5–14.5)
WBC: 6.6 10*3/uL (ref 3.6–11.0)

## 2017-08-20 LAB — CK: CK TOTAL: 4983 U/L — AB (ref 38–234)

## 2017-08-20 LAB — TROPONIN I: Troponin I: 0.24 ng/mL (ref <0.03)

## 2017-08-20 LAB — MAGNESIUM: MAGNESIUM: 1.6 mg/dL — AB (ref 1.7–2.4)

## 2017-08-20 MED ORDER — HYDROXYCHLOROQUINE SULFATE 200 MG PO TABS
200.0000 mg | ORAL_TABLET | ORAL | Status: DC
  Administered 2017-08-21: 13:00:00 200 mg via ORAL
  Filled 2017-08-20: qty 1

## 2017-08-20 MED ORDER — ALBUTEROL SULFATE (2.5 MG/3ML) 0.083% IN NEBU: 2.5000 mg | INHALATION_SOLUTION | RESPIRATORY_TRACT | Status: DC | PRN

## 2017-08-20 MED ORDER — ACETAMINOPHEN 650 MG RE SUPP: 650.0000 mg | Freq: Four times a day (QID) | RECTAL | Status: DC | PRN

## 2017-08-20 MED ORDER — LATANOPROST 0.005 % OP SOLN
1.0000 [drp] | Freq: Every day | OPHTHALMIC | Status: DC
  Administered 2017-08-21 – 2017-08-22 (×2): 1 [drp] via OPHTHALMIC
  Filled 2017-08-20: qty 2.5

## 2017-08-20 MED ORDER — POTASSIUM CHLORIDE IN NACL 20-0.9 MEQ/L-% IV SOLN
INTRAVENOUS | Status: DC
  Administered 2017-08-20 – 2017-08-22 (×4): via INTRAVENOUS
  Filled 2017-08-20 (×7): qty 1000

## 2017-08-20 MED ORDER — ACETAMINOPHEN 325 MG PO TABS
650.0000 mg | ORAL_TABLET | Freq: Four times a day (QID) | ORAL | Status: DC | PRN
  Administered 2017-08-21: 650 mg via ORAL
  Filled 2017-08-20: qty 2

## 2017-08-20 MED ORDER — ONDANSETRON HCL 4 MG PO TABS: 4.0000 mg | ORAL_TABLET | Freq: Four times a day (QID) | ORAL | Status: DC | PRN

## 2017-08-20 MED ORDER — SODIUM CHLORIDE 0.9 % IV BOLUS
1000.0000 mL | Freq: Once | INTRAVENOUS | Status: AC
  Administered 2017-08-20: 1000 mL via INTRAVENOUS

## 2017-08-20 MED ORDER — HYDROXYCHLOROQUINE SULFATE 200 MG PO TABS: 200.0000 mg | ORAL_TABLET | ORAL | Status: DC

## 2017-08-20 MED ORDER — ASPIRIN EC 81 MG PO TBEC
81.0000 mg | DELAYED_RELEASE_TABLET | Freq: Every day | ORAL | Status: DC
  Administered 2017-08-21 – 2017-08-22 (×2): 81 mg via ORAL
  Filled 2017-08-20 (×2): qty 1

## 2017-08-20 MED ORDER — LOPERAMIDE HCL 2 MG PO CAPS
2.0000 mg | ORAL_CAPSULE | Freq: Four times a day (QID) | ORAL | Status: DC | PRN
  Administered 2017-08-22: 16:00:00 2 mg via ORAL
  Filled 2017-08-20: qty 1

## 2017-08-20 MED ORDER — POLYETHYLENE GLYCOL 3350 17 G PO PACK: 17.0000 g | PACK | Freq: Every day | ORAL | Status: DC | PRN

## 2017-08-20 MED ORDER — PANTOPRAZOLE SODIUM 40 MG PO TBEC
40.0000 mg | DELAYED_RELEASE_TABLET | Freq: Every day | ORAL | Status: DC
  Administered 2017-08-21 – 2017-08-22 (×2): 40 mg via ORAL
  Filled 2017-08-20 (×2): qty 1

## 2017-08-20 MED ORDER — RISAQUAD PO CAPS
1.0000 | ORAL_CAPSULE | Freq: Two times a day (BID) | ORAL | Status: DC
Start: 2017-08-20 — End: 2017-08-23
  Administered 2017-08-20 – 2017-08-22 (×5): 1 via ORAL
  Filled 2017-08-20 (×5): qty 1

## 2017-08-20 MED ORDER — ONDANSETRON HCL 4 MG/2ML IJ SOLN: 4.0000 mg | Freq: Four times a day (QID) | INTRAMUSCULAR | Status: DC | PRN

## 2017-08-20 MED ORDER — MAGNESIUM SULFATE 2 GM/50ML IV SOLN
2.0000 g | Freq: Once | INTRAVENOUS | Status: AC
  Administered 2017-08-20: 23:00:00 2 g via INTRAVENOUS
  Filled 2017-08-20: qty 50

## 2017-08-20 MED ORDER — ENOXAPARIN SODIUM 40 MG/0.4ML ~~LOC~~ SOLN
30.0000 mg | SUBCUTANEOUS | Status: DC
  Administered 2017-08-20 – 2017-08-22 (×3): 30 mg via SUBCUTANEOUS
  Filled 2017-08-20 (×3): qty 0.4

## 2017-08-20 MED ORDER — POTASSIUM CHLORIDE CRYS ER 20 MEQ PO TBCR
40.0000 meq | EXTENDED_RELEASE_TABLET | ORAL | Status: AC
  Administered 2017-08-20 – 2017-08-21 (×2): 40 meq via ORAL
  Filled 2017-08-20 (×2): qty 2

## 2017-08-20 MED ORDER — TRAMADOL HCL 50 MG PO TABS
50.0000 mg | ORAL_TABLET | Freq: Four times a day (QID) | ORAL | Status: DC | PRN
  Administered 2017-08-22: 16:00:00 50 mg via ORAL
  Filled 2017-08-20: qty 1

## 2017-08-20 NOTE — ED Notes (Signed)
Report called to [place] rn floor nurse.

## 2017-08-20 NOTE — ED Triage Notes (Addendum)
Pt comes via [facility] from home with c/o of weakness, diarrhea and bed sores. Pt sleeps in recliner. Pt also has kidney cancer and takes chemo pills. Pt has hx of colitis. VSS per EMS. Pt c/o bed sores on her back. Pt is alert.  MD at bedside. Pt is HOH

## 2017-08-20 NOTE — ED Provider Notes (Signed)
[facility] Emergency Department Provider Note ____________________________________________   First MD Initiated Contact with Patient 08/20/17 1700     (approximate)  I have reviewed the triage vital signs and the nursing notes.   HISTORY  Chief Complaint Diarrhea and Weakness   HPI ROSENDA GEFFRARD is a 84 y.o. female with history of renal cell carcinoma on oral chemotherapy was presented to the emergency department today with diarrhea, generalized weakness and sacral pain.  Patient has a chronic diarrhea but she says that it is worse.  Does not report any blood in her stool.  Also says that she has generalized weakness and feels like she is going to fall whenever she walks lately.  Also with decreased appetite.  Patient with known sacral wound per EMS which is more painful than usual today.  Past Medical History:  Diagnosis Date  . Anemia   . Arthritis   . Breast cancer ([place])   . CHF (congestive heart failure) ([place])   . CKD (chronic kidney disease) stage 2, GFR 60-89 ml/min    baseline creatinine 1.5  . GERD (gastroesophageal reflux disease)   . Glaucoma   . History of nephrectomy    patient reports only one kidney  . Hypertension   . Hyponatremia   . Osteopenia   . PUD (peptic ulcer disease)   . Renal cell carcinoma [facility])     Patient Active Problem List   Diagnosis Date Noted  . Aortic stenosis 03/12/2017  . Acute systolic CHF (congestive heart failure) ([place]) 02/28/2017  . HTN (hypertension) 06/15/2015  . Anxiety 06/15/2015  . Hyponatremia with decreased serum osmolality 05/30/2015  . CHF (congestive heart failure) ([place]) 05/08/2015    Past Surgical History:  Procedure Laterality Date  . BREAST SURGERY     Left mastectomy  . COLONOSCOPY    . HEMIARTHROPLASTY SHOULDER FRACTURE     Right shoulder  . HIP SURGERY     Left hip  . NEPHRECTOMY     Right nephrectomy    Prior to Admission medications   Medication Sig Start Date End Date  Taking? Authorizing Provider  acetaminophen (TYLENOL) 500 MG tablet Take 500 mg by mouth every 8 (eight) hours as needed for fever.    [provider]  acidophilus (RISAQUAD) CAPS capsule Take 1 capsule by mouth 2 (two) times daily.    [provider]  albuterol (PROVENTIL HFA;VENTOLIN HFA) 108 (90 Base) MCG/ACT inhaler Inhale 2 puffs into the lungs every 6 (six) hours as needed for wheezing or shortness of breath. Patient not taking: Reported on 03/12/2017 03/04/17   Nicholes Mango, MD  amLODipine (NORVASC) 5 MG tablet Take 1 tablet by mouth daily. 04/13/15   [provider]  amoxicillin-clavulanate (AUGMENTIN) 875-125 MG tablet Take 1 tablet by mouth every 12 (twelve) hours. 03/04/17   Nicholes Mango, MD  aspirin EC 81 MG tablet Take 81 mg by mouth daily.    [provider]  beta carotene w/minerals (OCUVITE) tablet Take 1 tablet by mouth every evening.    [provider]  Calcium Carbonate-Vit D-Min (CALCIUM 600+D PLUS MINERALS PO) Take 1 tablet by mouth 2 (two) times daily.     [provider]  carvedilol (COREG) 3.125 MG tablet Take 1 tablet (3.125 mg total) by mouth 2 (two) times daily with a meal. 05/12/15   Fritzi Mandes, MD  CRANBERRY PO Take 1 capsule by mouth every evening.    [provider]  dorzolamide (TRUSOPT) 2 % ophthalmic solution  Place 1 drop into both eyes daily. 05/12/15   Fritzi Mandes, MD  enalapril (VASOTEC) 10 MG tablet Take 10 mg by mouth daily.    [provider]  furosemide (LASIX) 20 MG tablet Take 1 tablet (20 mg total) by mouth 2 (two) times daily. Patient not taking: Reported on 03/12/2017 03/04/17 03/04/18  Nicholes Mango, MD  hydroxychloroquine (PLAQUENIL) 200 MG tablet Take 200 mg by mouth every other day.     [provider]  IRON-VITAMIN C PO Take 1 tablet by mouth every evening.    [provider]  latanoprost (XALATAN) 0.005 % ophthalmic solution Place 1 drop into both eyes at bedtime. 05/12/15    Fritzi Mandes, MD  Loperamide HCl (IMODIUM PO) Take 1 tablet by mouth daily as needed (for diarrhea).     [provider]  pantoprazole (PROTONIX) 40 MG tablet Take 1 tablet by mouth daily. 02/17/15   [provider]  pravastatin (PRAVACHOL) 20 MG tablet Take 20 mg by mouth every evening.    [provider]    Allergies Ezetimibe; Fenofibrate micronized; Statins; and Sulfamethoxazole-trimethoprim  Family History  Problem Relation Age of Onset  . CAD Mother   . CVA Father   . CAD Sister     Social History Social History   Tobacco Use  . Smoking status: Former Research scientist (life sciences)  . Smokeless tobacco: Never Used  Substance Use Topics  . Alcohol use: No    Alcohol/week: 0.0 oz  . Drug use: No    Review of Systems  Constitutional: No fever/chills Eyes: No visual changes. ENT: No sore throat. Cardiovascular: Denies chest pain. Respiratory: Denies shortness of breath. Gastrointestinal: No abdominal pain.  No nausea, no vomiting.   No constipation. Genitourinary: Negative for dysuria. Musculoskeletal: Negative for back pain. Skin: Negative for rash. Neurological: Negative for headaches, focal weakness or numbness.   ____________________________________________   PHYSICAL EXAM:  VITAL SIGNS: ED Triage Vitals  Enc Vitals Group     BP 08/20/17 1702 127/79     Pulse Rate 08/20/17 1702 76     Resp --      Temp --      Temp src --      SpO2 08/20/17 1702 93 %     Weight 08/20/17 1703 102 lb (46.3 kg)     Height 08/20/17 1703 5\' 1"  (1.549 m)     Head Circumference --      Peak Flow --      Pain Score 08/20/17 1710 8     Pain Loc --      Pain Edu? --      Excl. in [place]? --     Constitutional: Alert and oriented.  Appears pale.  No distress.  Cachectic. Eyes: Conjunctivae are normal.  Head: Atraumatic. Nose: No congestion/rhinnorhea. Mouth/Throat: Mucous membranes are moist.  Neck: No stridor.   Cardiovascular: Normal rate, regular rhythm. Grossly  normal heart sounds.  Respiratory: Normal respiratory effort.  No retractions. Lungs CTAB. Gastrointestinal: Soft and nontender. No distention. No CVA tenderness. Musculoskeletal: No lower extremity tenderness nor edema.  No joint effusions.  Sacral erythema as well as erythema around the rectum without any obvious skin breakdown or ulceration.  The sacral erythema is proximally 1 x 4 cm and is tender to palpation but without any fluctuance or drainage.  Neurologic:  Normal speech and language. No gross focal neurologic deficits are appreciated. Skin:  Skin is warm, dry and intact. No rash noted. Psychiatric: Mood and affect are normal.  Speech and behavior are normal.  ____________________________________________   LABS (all labs ordered are listed, but only abnormal results are displayed)  Labs Reviewed - No data to display ____________________________________________  EKG  ED ECG REPORT I, Doran Stabler, the attending physician, personally viewed and interpreted this ECG.   Date: 08/20/2017  EKG Time: 1714  Rate: 75  Rhythm: normal sinus rhythm  Axis: Normal  Intervals:none  ST&T Change: T wave inversions in 2 3 and aVF.  T wave inversions in V4 through 6.  Minimal depression in V4 through 6. No significant change from previous EKG. ____________________________________________  RADIOLOGY   ____________________________________________   PROCEDURES  Procedure(s) performed:   Procedures  Critical Care performed:   ____________________________________________   INITIAL IMPRESSION / ASSESSMENT AND PLAN / ED COURSE  Pertinent labs & imaging results that were available during my care of the patient were reviewed by me and considered in my medical decision making (see chart for details).  DDX: C. difficile, infectious diarrhea, less light abnormality, kidney failure, AMI, urinary tract infection, paraneoplastic syndrome As part of my medical decision making, I  reviewed the following data within the [place] Notes from prior ED visits  ----------------------------------------- 8:39 PM on 08/20/2017 -----------------------------------------  Patient appears to have decreased renal function as well as elevated troponin.  Likely related to failure to thrive.  Still pending stool sample from diarrhea.  Patient be admitted to the hospital.  Signed out to Dr. Darvin Neighbours.  Also discussed with the family, son who is at the bedside.  ____________________________________________   FINAL CLINICAL IMPRESSION(S) / ED DIAGNOSES  Failure to thrive.  Dehydration.  Elevated troponin.  NEW MEDICATIONS STARTED DURING THIS VISIT:  New Prescriptions   No medications on file     Note:  This document was prepared using Dragon voice recognition software and may include unintentional dictation errors.     [name], MD 08/20/17 2040

## 2017-08-20 NOTE — ED Notes (Signed)
[phone] pt's son Margaret Matthews.Marland Kitchen

## 2017-08-20 NOTE — ED Notes (Signed)
Pt has redness and irration noted to bottom.MD assessed

## 2017-08-20 NOTE — ED Notes (Signed)
afib on monitor.  Family with pt.  Pt waiting on admission.  Pt alert.

## 2017-08-20 NOTE — H&P (Signed)
[place] [facility] [place] NAME: Margaret Matthews    MR#:  [id number]  DATE OF BIRTH:  1931-01-10  DATE OF ADMISSION:  08/20/2017  PRIMARY CARE PHYSICIAN: Dion Body, MD   REQUESTING/REFERRING PHYSICIAN: Dr. Lucita Lora  CHIEF COMPLAINT:   Chief Complaint  Patient presents with  . Diarrhea  . Weakness    HISTORY OF PRESENT ILLNESS:  Margaret Matthews  is a 84 y.o. female with a known history of hypertension, chronic diarrhea, renal cell carcinoma presents to the emergency room complaining of worsening weakness and loss of appetite.  Patient here in the emergency room was found to have troponin 0 0.24 and elevated CK of 4500.  Patient has not had any change in medications recently. Patient is on cabozantinib for renal cell carcinoma.  PAST MEDICAL HISTORY:   Past Medical History:  Diagnosis Date  . Anemia   . Arthritis   . Breast cancer ([place])   . CHF (congestive heart failure) ([place])   . CKD (chronic kidney disease) stage 2, GFR 60-89 ml/min    baseline creatinine 1.5  . GERD (gastroesophageal reflux disease)   . Glaucoma   . History of nephrectomy    patient reports only one kidney  . Hypertension   . Hyponatremia   . Osteopenia   . PUD (peptic ulcer disease)   . Renal cell carcinoma ([place])     PAST SURGICAL HISTORY:   Past Surgical History:  Procedure Laterality Date  . BREAST SURGERY     Left mastectomy  . COLONOSCOPY    . HEMIARTHROPLASTY SHOULDER FRACTURE     Right shoulder  . HIP SURGERY     Left hip  . NEPHRECTOMY     Right nephrectomy    SOCIAL HISTORY:   Social History   Tobacco Use  . Smoking status: Former Research scientist (life sciences)  . Smokeless tobacco: Never Used  Substance Use Topics  . Alcohol use: No    Alcohol/week: 0.0 oz    FAMILY HISTORY:   Family History  Problem Relation Age of Onset  . CAD Mother   . CVA Father   . CAD Sister     DRUG ALLERGIES:   Allergies  Allergen Reactions  . Ezetimibe Other (See  Comments)    ELEV LFTS  . Fenofibrate Micronized Other (See Comments)    ELEV LFTS  . Statins Other (See Comments)    Reaction: Pt says "it was showing up in her liver."   . Sulfamethoxazole-Trimethoprim Diarrhea    REVIEW OF SYSTEMS:   Review of Systems  Constitutional: Positive for malaise/fatigue. Negative for chills and fever.  HENT: Negative for sore throat.   Eyes: Negative for blurred vision, double vision and pain.  Respiratory: Negative for cough, hemoptysis, shortness of breath and wheezing.   Cardiovascular: Negative for chest pain, palpitations, orthopnea and leg swelling.  Gastrointestinal: Positive for diarrhea. Negative for abdominal pain, constipation, heartburn, nausea and vomiting.  Genitourinary: Negative for dysuria and hematuria.  Musculoskeletal: Positive for myalgias. Negative for back pain and joint pain.  Skin: Negative for rash.  Neurological: Positive for dizziness and weakness. Negative for sensory change, speech change, focal weakness and headaches.  Endo/Heme/Allergies: Does not bruise/bleed easily.  Psychiatric/Behavioral: Negative for depression. The patient is not nervous/anxious.     MEDICATIONS AT HOME:   Prior to Admission medications   Medication Sig Start Date End Date Taking? Authorizing Provider  acidophilus (RISAQUAD) CAPS capsule Take 1 capsule by mouth 2 (two) times daily.  Yes [provider]  amLODipine (NORVASC) 10 MG tablet Take 1 tablet by mouth daily.  04/13/15  Yes [provider]  aspirin EC 81 MG tablet Take 81 mg by mouth daily.   Yes [provider]  Calcium Carbonate-Vit D-Min (CALCIUM 600+D PLUS MINERALS PO) Take 1 tablet by mouth 2 (two) times daily.    Yes [provider]  CRANBERRY PO Take 1 capsule by mouth every evening.   Yes [provider]  enalapril (VASOTEC) 10 MG tablet Take 10 mg by mouth 2 (two) times daily.    Yes [provider]  hydroxychloroquine  (PLAQUENIL) 200 MG tablet Take 200 mg by mouth every other day.    Yes [provider]  IRON-VITAMIN C PO Take 1 tablet by mouth every evening.   Yes [provider]  pantoprazole (PROTONIX) 40 MG tablet Take 1 tablet by mouth daily. 02/17/15  Yes [provider]  rosuvastatin (CRESTOR) 40 MG tablet Take 40 mg by mouth daily. 06/23/17  Yes [provider]  acetaminophen (TYLENOL) 500 MG tablet Take 500 mg by mouth every 8 (eight) hours as needed for fever.    [provider]  albuterol (PROVENTIL HFA;VENTOLIN HFA) 108 (90 Base) MCG/ACT inhaler Inhale 2 puffs into the lungs every 6 (six) hours as needed for wheezing or shortness of breath. Patient not taking: Reported on 03/12/2017 03/04/17   Nicholes Mango, MD  amoxicillin-clavulanate (AUGMENTIN) 875-125 MG tablet Take 1 tablet by mouth every 12 (twelve) hours. Patient not taking: Reported on 08/20/2017 03/04/17   Nicholes Mango, MD  carvedilol (COREG) 3.125 MG tablet Take 1 tablet (3.125 mg total) by mouth 2 (two) times daily with a meal. Patient not taking: Reported on 08/20/2017 05/12/15   Fritzi Mandes, MD  dorzolamide (TRUSOPT) 2 % ophthalmic solution Place 1 drop into both eyes daily. Patient not taking: Reported on 08/20/2017 05/12/15   Fritzi Mandes, MD  furosemide (LASIX) 20 MG tablet Take 1 tablet (20 mg total) by mouth 2 (two) times daily. Patient not taking: Reported on 03/12/2017 03/04/17 03/04/18  Nicholes Mango, MD  latanoprost (XALATAN) 0.005 % ophthalmic solution Place 1 drop into both eyes at bedtime. 05/12/15   Fritzi Mandes, MD     VITAL SIGNS:  Blood pressure (!) 120/58, pulse 73, resp. rate 20, height 5\' 1"  (1.549 m), weight 46.3 kg (102 lb), SpO2 93 %.  PHYSICAL EXAMINATION:  Physical Exam  GENERAL:  84 y.o.-year-old patient lying in the bed with no acute distress.  EYES: Pupils equal, round, reactive to light and accommodation. No scleral icterus. Extraocular muscles intact.  HEENT: Head atraumatic,  normocephalic. Oropharynx and nasopharynx clear. No oropharyngeal erythema, moist oral mucosa  NECK:  Supple, no jugular venous distention. No thyroid enlargement, no tenderness.  LUNGS: Normal breath sounds bilaterally, no wheezing, rales, rhonchi. No use of accessory muscles of respiration.  CARDIOVASCULAR: S1, S2 normal. No murmurs, rubs, or gallops.  ABDOMEN: Soft, nontender, nondistended. Bowel sounds present. No organomegaly or mass.  EXTREMITIES: No pedal edema, cyanosis, or clubbing. + 2 pedal & radial pulses b/l.  Diffuse muscle wasting NEUROLOGIC: Cranial nerves II through XII are intact. No focal Motor or sensory deficits appreciated b/l PSYCHIATRIC: The patient is alert and awake SKIN: No obvious rash, lesion, or ulcer.   LABORATORY PANEL:   CBC Recent Labs  Lab 08/20/17 1704  WBC 6.6  HGB 11.3*  HCT 32.1*  PLT 165   ------------------------------------------------------------------------------------------------------------------  Chemistries  Recent Labs  Lab 08/20/17 1704  NA 138  K 3.2*  CL 100  CO2 27  GLUCOSE 80  BUN 25*  CREATININE 1.54*  CALCIUM 8.1*  AST 280*  ALT 105*  ALKPHOS 86  BILITOT 0.9   ------------------------------------------------------------------------------------------------------------------  Cardiac Enzymes Recent Labs  Lab 08/20/17 1704  TROPONINI 0.24*   ------------------------------------------------------------------------------------------------------------------  RADIOLOGY:  Dg Chest 1 View  Result Date: 08/20/2017 CLINICAL DATA:  Cough EXAM: CHEST  1 VIEW COMPARISON:  02/28/2017 FINDINGS: Mild cardiac enlargement. Negative for heart failure. Mild bibasilar atelectasis/infiltrate and small bilateral effusions. Significant improvement in aeration since the prior study. IMPRESSION: Negative for heart failure. Mild bibasilar atelectasis and small effusions. Electronically Signed   By: Franchot Gallo M.D.   On: 08/20/2017  18:13     IMPRESSION AND PLAN:   *Acute rhabdomyolysis with CK of 4500.  Likely due to dehydration.  Could be from chemotherapy.  Will treat with IV hydration.  Hold statin.  Repeat CK level in the morning.  Troponin 0 0.24 is likely due to rhabdomyolysis.  No chest pain or shortness of breath.  EKG shows no thing acute. Will repeat troponin.  Monitor on telemetry for 24 hours.  *Acute kidney injury secondary to dehydration from decreased oral intake and rhabdomyolysis.  Start IV fluids.  Monitor input and output.  Repeat labs in the morning.  *Mild elevation of AST and ALT most likely from rhabdomyolysis.  Will repeat labs in the morning  *Renal cell carcinoma.  Patient is on cabozantinib .  Follows with [facility] oncology  *Hypertension.  Hold medications as blood pressure is in the low normal range.  *DVT prophylaxis with Lovenox  All the records are reviewed and case discussed with ED provider. Management plans discussed with the patient, family and they are in agreement.  CODE STATUS: Full code  TOTAL TIME TAKING CARE OF THIS PATIENT: 40 minutes.   Neita Carp M.D on 08/20/2017 at 8:34 PM  Between 7am to 6pm - Pager - [phone]  After 6pm go to [email or web address] - password [email or web address] [facility]  Office  [phone]  CC: Primary care physician; Dion Body, MD  Note: This dictation was prepared with Dragon dictation along with smaller phrase technology. Any transcriptional errors that result from this process are unintentional.

## 2017-08-20 NOTE — ED Notes (Signed)
Date and time results received: 08/20/17 1800 (use smartphrase ".now" to insert current time)  Test: troponin Critical Value: 0.24  Name of Provider Notified: [place]  Orders Received? Or Actions Taken?: Orders Received - See Orders for details

## 2017-08-21 LAB — COMPREHENSIVE METABOLIC PANEL
ALT: 96 U/L — AB (ref 0–44)
AST: 253 U/L — ABNORMAL HIGH (ref 15–41)
Albumin: 1.9 g/dL — ABNORMAL LOW (ref 3.5–5.0)
Alkaline Phosphatase: 75 U/L (ref 38–126)
Anion gap: 9 (ref 5–15)
BUN: 23 mg/dL (ref 8–23)
CALCIUM: 7.7 mg/dL — AB (ref 8.9–10.3)
CHLORIDE: 108 mmol/L (ref 98–111)
CO2: 23 mmol/L (ref 22–32)
CREATININE: 1.38 mg/dL — AB (ref 0.44–1.00)
GFR calc Af Amer: 39 mL/min — ABNORMAL LOW (ref >60)
GFR, EST NON AFRICAN AMERICAN: 34 mL/min — AB (ref >60)
Glucose, Bld: 40 mg/dL — CL (ref 70–99)
Potassium: 4 mmol/L (ref 3.5–5.1)
Sodium: 140 mmol/L (ref 135–145)
Total Bilirubin: 1 mg/dL (ref 0.3–1.2)
Total Protein: 4.6 g/dL — ABNORMAL LOW (ref 6.5–8.1)

## 2017-08-21 LAB — GASTROINTESTINAL PANEL BY PCR, STOOL (REPLACES STOOL CULTURE)
ASTROVIRUS: NOT DETECTED
Adenovirus F40/41: NOT DETECTED
Campylobacter species: NOT DETECTED
Cryptosporidium: NOT DETECTED
Cyclospora cayetanensis: NOT DETECTED
ENTAMOEBA HISTOLYTICA: NOT DETECTED
ENTEROAGGREGATIVE E COLI (EAEC): NOT DETECTED
Enteropathogenic E coli (EPEC): NOT DETECTED
Enterotoxigenic E coli (ETEC): NOT DETECTED
Giardia lamblia: NOT DETECTED
NOROVIRUS GI/GII: NOT DETECTED
Plesimonas shigelloides: NOT DETECTED
Rotavirus A: NOT DETECTED
SALMONELLA SPECIES: NOT DETECTED
SHIGELLA/ENTEROINVASIVE E COLI (EIEC): NOT DETECTED
Sapovirus (I, II, IV, and V): NOT DETECTED
Shiga like toxin producing E coli (STEC): NOT DETECTED
VIBRIO CHOLERAE: NOT DETECTED
Vibrio species: NOT DETECTED
Yersinia enterocolitica: NOT DETECTED

## 2017-08-21 LAB — CK: CK TOTAL: 4639 U/L — AB (ref 38–234)

## 2017-08-21 LAB — CBC
HEMATOCRIT: 28 % — AB (ref 35.0–47.0)
Hemoglobin: 9.9 g/dL — ABNORMAL LOW (ref 12.0–16.0)
MCH: 34.7 pg — AB (ref 26.0–34.0)
MCHC: 35.6 g/dL (ref 32.0–36.0)
MCV: 97.5 fL (ref 80.0–100.0)
Platelets: 117 10*3/uL — ABNORMAL LOW (ref 150–440)
RBC: 2.87 MIL/uL — ABNORMAL LOW (ref 3.80–5.20)
RDW: 21.9 % — AB (ref 11.5–14.5)
WBC: 6.2 10*3/uL (ref 3.6–11.0)

## 2017-08-21 LAB — GLUCOSE, CAPILLARY
GLUCOSE-CAPILLARY: 160 mg/dL — AB (ref 70–99)
GLUCOSE-CAPILLARY: 47 mg/dL — AB (ref 70–99)
GLUCOSE-CAPILLARY: 164 mg/dL — AB (ref 70–99)
GLUCOSE-CAPILLARY: 161 mg/dL — AB (ref 70–99)

## 2017-08-21 MED ORDER — ENSURE ENLIVE PO LIQD: 237.0000 mL | Freq: Two times a day (BID) | ORAL | Status: DC

## 2017-08-21 MED ORDER — DEXTROSE 50 % IV SOLN
INTRAVENOUS | Status: AC
  Administered 2017-08-21: 07:00:00 50 mL via INTRAVENOUS
  Filled 2017-08-21: qty 50

## 2017-08-21 MED ORDER — DEXTROSE 50 % IV SOLN
1.0000 | Freq: Once | INTRAVENOUS | Status: AC
  Administered 2017-08-21: 50 mL via INTRAVENOUS

## 2017-08-21 MED ORDER — CABOZANTINIB S-MALATE 40 MG PO TABS
40.0000 mg | ORAL_TABLET | Freq: Every day | ORAL | Status: DC
  Administered 2017-08-22: 16:00:00 40 mg via ORAL
  Filled 2017-08-21: qty 1

## 2017-08-21 NOTE — Clinical Social Work Note (Signed)
Clinical Social Work Assessment  Patient Details  Name: Margaret Matthews MRN: [id number] Date of Birth: 29-Jul-1930  Date of referral:  08/21/17               Reason for consult:  Facility Placement                Permission sought to share information with:  Case Manager, Customer service manager, Family Supports Permission granted to share information::  Yes, Verbal Permission Granted  Name::      SNF  Agency::   [facility]   Relationship::     Contact Information:     Housing/Transportation Living arrangements for the past 2 months:  Single Family Home Source of Information:  Adult Children Patient Interpreter Needed:  None Criminal Activity/Legal Involvement Pertinent to Current Situation/Hospitalization:  No - Comment as needed Significant Relationships:  Adult Children Lives with:  Adult Children Do you feel safe going back to the place where you live?  Yes Need for family participation in patient care:  Yes (Comment)  Care giving concerns:  Patient lives with adult sons in [place] Worker assessment / plan:  CSW consulted for SNF placement. CSW met with patient and son Margaret Matthews at bedside. CSW introduced self and explained role. CSW explained that therapy is recommending SNF for rehab. Son and patient are both in agreement with SNF. Son states that he and his brother have been caring for patient and they feel that she would benefit from rehab. Patient is also agreeable to going to SNF. Son states that [facility] would be a preference due to proximity of their home. CSW explained bed search process and that it would depend on bed availability and insurance. Son and patient expressed understanding of this and agreed to bed search. CSW initiated bed search and will give bed offers once received. CSW will follow for discharge planning.   Employment status:  Retired Nurse, adult PT Recommendations:  [place] / Referral to community resources:  [place]  Patient/Family's Response to care:  Patient thanked CSW for helping with discharge   Patient/Family's Understanding of and Emotional Response to Diagnosis, Current Treatment, and Prognosis:  Son states he is very thankful for SNF recommendation and for CSW meeting with them.   Emotional Assessment Appearance:  Appears stated age Attitude/Demeanor/Rapport:    Affect (typically observed):  Pleasant, Quiet Orientation:  Oriented to Self, Oriented to Place Alcohol / Substance use:  Not Applicable Psych involvement (Current and /or in the community):  No (Comment)  Discharge Needs  Concerns to be addressed:  Discharge Planning Concerns Readmission within the last 30 days:  No Current discharge risk:  None Barriers to Discharge:  Continued Medical Work up   [employer], [place] 08/21/2017, 12:47 PM

## 2017-08-21 NOTE — Evaluation (Signed)
Physical Therapy Evaluation Patient Details Name: Margaret Matthews MRN: [id number] DOB: 07-26-1930 Today's Date: 08/21/2017   History of Present Illness  Pt is a 84 y/o F who presented with weakness, loss of appetite, and diarrhea. Pt with renal cell carcinoma and undergoing chemo. Pt with elevated troponin thought to be due to rhabdomyolysis.  Per chart review, pt with raw skin on buttocks.  Pt's PMH includes breast cancer, CHF, glaucoma, CKD, osteopenia, R shoulder hemiarthroplasty, L hip surgery.      Clinical Impression  Pt admitted with above diagnosis. Pt currently with functional limitations due to the deficits listed below (see PT Problem List). Margaret Matthews was very pleasant and agreeable to therapy.  She is very quick to fatigue with all aspects of mobility.  She current requires mod assist for bed mobility and min assist for sit<>stand and stand pivot transfers.  Pt fatigues quickly after each sit>stand and thus was unable to ambulate.  Given pt's current mobility status, recommending SNF at d/c.  Son works during the day and the pt does not have any additional assistance available when he is gone. Pt will benefit from skilled PT to increase their independence and safety with mobility to allow discharge to the venue listed below.      Follow Up Recommendations SNF    Equipment Recommendations  None recommended by PT    Recommendations for Other Services       Precautions / Restrictions Precautions Precautions: Fall Restrictions Weight Bearing Restrictions: No      Mobility  Bed Mobility Overal bed mobility: Needs Assistance Bed Mobility: Supine to Sit;Sit to Supine     Supine to sit: Mod assist;HOB elevated Sit to supine: Min assist   General bed mobility comments: Mod assist to elevate trunk for supine>sit.  Pt requires assist to bring BLE back into bed.   Transfers Overall transfer level: Needs assistance Equipment used: Rolling walker (2 wheeled) Transfers: Sit  to/from [facility] Sit to Stand: Min assist Stand pivot transfers: Min assist       General transfer comment: Cues for proper hand placement.  Pt fatigues quickly after each sit>stand.  Pt performed sit>stand from bed x2 and from [facility] x1.   Ambulation/Gait             General Gait Details: Unable to safely assess at this time.  Pt politely declines to attempt ambulation due to fatigue from sit<>stand and stand pivot transfer.   Stairs            Wheelchair Mobility    Modified Rankin (Stroke Patients Only)       Balance Overall balance assessment: Needs assistance Sitting-balance support: No upper extremity supported;Feet supported Sitting balance-Leahy Scale: Fair Sitting balance - Comments: Pt able to sit EOB without UE support but likely would lose her balance with perturbation   Standing balance support: Single extremity supported;During functional activity Standing balance-Leahy Scale: Poor Standing balance comment: Pt relies on at least 1UE support for static and dynamic activities                             Pertinent Vitals/Pain Pain Assessment: Faces Faces Pain Scale: Hurts whole lot Pain Location: buttocks Pain Descriptors / Indicators: Discomfort;Grimacing;Guarding;Moaning Pain Intervention(s): Limited activity within patient's tolerance;Monitored during session    Home Living Family/patient expects to be discharged to:: Private residence Living Arrangements: Children Available Help at Discharge: Family;Available PRN/intermittently(son away at work during the day)  Type of Home: House Home Access: Stairs to enter Entrance Stairs-Rails: Left Entrance Stairs-Number of Steps: 2 Home Layout: Two level Home Equipment: Walker - 2 wheels;Cane - single point Additional Comments: Attempted to ask the pt about entrance to home on several occasions but the pt seems confused and did not provide this information.  Information for  entrance to home taken from chart review.     Prior Function Level of Independence: Needs assistance   Gait / Transfers Assistance Needed: Pt reports she is ambulating short distances in home with RW or SPC depending on how she is feeling.  She denies any falls in the past 6 months.   ADL's / Homemaking Assistance Needed: Pt reports she is independent with sponge bathing, dressing.  Son does the cooking, cleaning, driving.         Hand Dominance        Extremity/Trunk Assessment   Upper Extremity Assessment Upper Extremity Assessment: Generalized weakness    Lower Extremity Assessment Lower Extremity Assessment: Generalized weakness    Cervical / Trunk Assessment Cervical / Trunk Assessment: Kyphotic  Communication   Communication: HOH  Cognition Arousal/Alertness: Awake/alert Behavior During Therapy: WFL for tasks assessed/performed Overall Cognitive Status: Difficult to assess                                        General Comments      Exercises     Assessment/Plan    PT Assessment Patient needs continued PT services  PT Problem List Decreased strength;Decreased activity tolerance;Decreased balance;Decreased mobility;Decreased knowledge of use of DME;Decreased safety awareness       PT Treatment Interventions DME instruction;Gait training;Stair training;Functional mobility training;Therapeutic activities;Therapeutic exercise;Balance training;Neuromuscular re-education;Patient/family education;Cognitive remediation;Wheelchair mobility training    PT Goals (Current goals can be found in the Care Plan section)  Acute Rehab PT Goals Patient Stated Goal: to feel better PT Goal Formulation: With patient Time For Goal Achievement: 09/04/17 Potential to Achieve Goals: Fair    Frequency Min 2X/week   Barriers to discharge Inaccessible home environment;Decreased caregiver support Flight of steps in home, no assist available while son away at work  during the day    Co-evaluation               AM-PAC PT "6 Clicks" Daily Activity  Outcome Measure Difficulty turning over in bed (including adjusting bedclothes, sheets and blankets)?: Unable Difficulty moving from lying on back to sitting on the side of the bed? : Unable Difficulty sitting down on and standing up from a chair with arms (e.g., wheelchair, bedside commode, etc,.)?: Unable Help needed moving to and from a bed to chair (including a wheelchair)?: A Little Help needed walking in hospital room?: A Lot Help needed climbing 3-5 steps with a railing? : Total 6 Click Score: 9    End of Session   Activity Tolerance: Patient limited by fatigue Patient left: in bed;with call bell/phone within reach;with bed alarm set Nurse Communication: Mobility status PT Visit Diagnosis: Muscle weakness (generalized) (M62.81);Unsteadiness on feet (R26.81);Other abnormalities of gait and mobility (R26.89);Difficulty in walking, not elsewhere classified (R26.2)    Time: 1100-1126 PT Time Calculation (min) (ACUTE ONLY): 26 min   Charges:   PT Evaluation $PT Eval Moderate Complexity: 1 Mod PT Treatments $Therapeutic Activity: 8-22 mins   PT G Codes:        Collie Siad PT, DPT 08/21/2017, 12:18 PM

## 2017-08-21 NOTE — Progress Notes (Addendum)
Hypoglycemic Event  CBG: 40  Treatment:4 oz of juice  Symptoms:Asymtomatic  Follow-up CBG: Time:4min  CBG Result:47  Possible Reasons for Event: Comments/MD notified:  Hypoglycemic protocol initiated, MD notified Amp D50 given  per MD, Rechecked 28min CBG 160    Margaret Matthews L Margaret Matthews

## 2017-08-21 NOTE — Progress Notes (Signed)
[place] [facility] [place] NAME: Margaret Matthews    MR#:  [id number]  DATE OF BIRTH:  1930-02-16  SUBJECTIVE:  patient came in after having increasing diarrhea weakness and poor PO intake she states her skin over the buttock hurts and is raw per RN. No family in the room.  REVIEW OF SYSTEMS:   Review of Systems  Constitutional: Positive for malaise/fatigue. Negative for chills, fever and weight loss.  HENT: Negative for ear discharge, ear pain and nosebleeds.   Eyes: Negative for blurred vision, pain and discharge.  Respiratory: Negative for sputum production, shortness of breath, wheezing and stridor.   Cardiovascular: Negative for chest pain, palpitations, orthopnea and PND.  Gastrointestinal: Positive for diarrhea. Negative for abdominal pain, nausea and vomiting.  Genitourinary: Negative for frequency and urgency.  Musculoskeletal: Negative for back pain and joint pain.  Neurological: Positive for weakness. Negative for sensory change, speech change and focal weakness.  Psychiatric/Behavioral: Negative for depression and hallucinations. The patient is not nervous/anxious.    Tolerating Diet:very little Tolerating PT: pending  DRUG ALLERGIES:   Allergies  Allergen Reactions  . Ezetimibe Other (See Comments)    ELEV LFTS  . Fenofibrate Micronized Other (See Comments)    ELEV LFTS  . Statins Other (See Comments)    Reaction: Pt says "it was showing up in her liver."   . Sulfamethoxazole-Trimethoprim Diarrhea    VITALS:  Blood pressure (!) 129/59, pulse 69, temperature (!) 97.4 F (36.3 C), temperature source Oral, resp. rate 14, height 5\' 1"  (1.549 m), weight 46.3 kg (102 lb), SpO2 94 %.  PHYSICAL EXAMINATION:   Physical Exam  GENERAL:  84 y.o.-year-old patient lying in the bed with no acute distress. weak, fraile EYES: Pupils equal, round, reactive to light and accommodation. No scleral icterus. Extraocular muscles intact.   HEENT: Head atraumatic, normocephalic. Oropharynx and nasopharynx clear. Dry oral mucosa mucoNECK:  Supple, no jugular venous distention. No thyroid enlargement, no tenderness.  LUNGS: Normal breath sounds bilaterally, no wheezing, rales, rhonchi. No use of accessory muscles of respiration.  CARDIOVASCULAR: S1, S2 normal. No murmurs, rubs, or gallops.  ABDOMEN: Soft, nontender, nondistended. Bowel sounds present. No organomegaly or mass.  EXTREMITIES: No cyanosis, clubbing or edema b/l.    NEUROLOGIC: Cranial nerves II through XII are intact. No focal Motor or sensory deficits b/l. Weak overall  PSYCHIATRIC:  patient is alert and oriented x 3.  SKIN: No obvious rash, lesion, or ulcer. Raw buttock skin  LABORATORY PANEL:  CBC Recent Labs  Lab 08/21/17 0430  WBC 6.2  HGB 9.9*  HCT 28.0*  PLT 117*    Chemistries  Recent Labs  Lab 08/20/17 1846 08/21/17 0430  NA  --  140  K  --  4.0  CL  --  108  CO2  --  23  GLUCOSE  --  40*  BUN  --  23  CREATININE  --  1.38*  CALCIUM  --  7.7*  MG 1.6*  --   AST  --  253*  ALT  --  96*  ALKPHOS  --  75  BILITOT  --  1.0   Cardiac Enzymes Recent Labs  Lab 08/20/17 1704  TROPONINI 0.24*   RADIOLOGY:  Dg Chest 1 View  Result Date: 08/20/2017 CLINICAL DATA:  Cough EXAM: CHEST  1 VIEW COMPARISON:  02/28/2017 FINDINGS: Mild cardiac enlargement. Negative for heart failure. Mild bibasilar atelectasis/infiltrate and small bilateral effusions. Significant improvement in aeration  since the prior study. IMPRESSION: Negative for heart failure. Mild bibasilar atelectasis and small effusions. Electronically Signed   By: Franchot Gallo M.D.   On: 08/20/2017 18:13   ASSESSMENT AND PLAN:  Margaret Matthews  is a 84 y.o. female with a known history of hypertension, chronic diarrhea, renal cell carcinoma presents to the emergency room complaining of worsening weakness and loss of appetite.  Patient here in the emergency room was found to have troponin 0  0.24 and elevated CK of 4500.   *Acute rhabdomyolysis with CK of 4900.  Likely due to dehydration.  Could be from chemotherapy.  Will treat with IV hydration.  Hold statin.  Repeat CK level trending down  Troponin 0 0.24 is likely due to rhabdomyolysis.  No chest pain or shortness of breath.  EKG shows nothing acute. -remains in NSR  *Acute kidney injury secondary to dehydration from decreased oral intake, ongoing diarrhea and rhabdomyolysis.  Start IV fluids.  Monitor input and output.   -creat 1.54--1.38 -baseline creat 1.0 (07/2017)  *Acute on chronic diarrhea -increased output  -GI PCR negative -C difficile pending  *Mild elevation of AST and ALT most likely from rhabdomyolysis.   -trending down  *Renal cell carcinoma.  Patient is on cabozantinib .  Follows with [facility] oncology  *Hypertension.  Hold medications as blood pressure is in the low normal range.  *DVT prophylaxis with Lovenox  *Generalized weakness and deconditioning. Physical therapy to see patient.  Try to call Gwyndolyn Saxon and Ebony Hail. Phones not going through. Will wait for family to come.  Case discussed with Care Management/Social Worker. Management plans discussed with the patient, family and they are in agreement.  CODE STATUS: full  DVT Prophylaxis: lovenox  TOTAL TIME TAKING CARE OF THIS PATIENT: *30* minutes.  >50% time spent on counselling and coordination of care  POSSIBLE D/C IN 2-3* DAYS, DEPENDING ON CLINICAL CONDITION.  Note: This dictation was prepared with Dragon dictation along with smaller phrase technology. Any transcriptional errors that result from this process are unintentional.  Fritzi Mandes M.D on 08/21/2017 at 9:28 AM  Between 7am to 6pm - Pager - [phone]  After 6pm go to [email or web address] - password [email or web address] [place] [facility]  Office  [phone]  CC: Primary care physician; Dion Body, MDPatient ID: Margaret Matthews, female   DOB:  09-Nov-1930, 84 y.o.   MRN: [id number]

## 2017-08-21 NOTE — NC FL2 (Signed)
[place] LEVEL OF CARE SCREENING TOOL     IDENTIFICATION  Patient Name: Margaret Matthews Birthdate: Dec 31, 1930 Sex: female Admission Date (Current Location): 08/20/2017  [place] and [place] Number:  Engineering geologist and Address:  [facility], [address] [place], [place] [address]      Provider Number: [id number]  Attending Physician Name and Address:  Fritzi Mandes, MD  Relative Name and Phone Number:  Meerab Maselli- son [phone]    Current Level of Care: Hospital Recommended Level of Care: [place] Prior Approval Number:    Date Approved/Denied:   PASRR Number: [id number]  Discharge Plan: SNF    Current Diagnoses: Patient Active Problem List   Diagnosis Date Noted  . Rhabdomyolysis 08/20/2017  . Aortic stenosis 03/12/2017  . Acute systolic CHF (congestive heart failure) ([place]) 02/28/2017  . HTN (hypertension) 06/15/2015  . Anxiety 06/15/2015  . Hyponatremia with decreased serum osmolality 05/30/2015  . CHF (congestive heart failure) ([place]) 05/08/2015    Orientation RESPIRATION BLADDER Height & Weight     Self, Time, Place  Normal Continent Weight: 102 lb (46.3 kg) Height:  5\' 1"  (154.9 cm)  BEHAVIORAL SYMPTOMS/MOOD NEUROLOGICAL BOWEL NUTRITION STATUS  (none) (none) Continent Diet(Regular )  AMBULATORY STATUS COMMUNICATION OF NEEDS Skin   Extensive Assist Verbally Normal                       Personal Care Assistance Level of Assistance  Bathing, Feeding, Dressing Bathing Assistance: Limited assistance Feeding assistance: Independent Dressing Assistance: Limited assistance     Functional Limitations Info  Sight, Hearing, Speech Sight Info: Adequate Hearing Info: Adequate Speech Info: Adequate    SPECIAL CARE FACTORS FREQUENCY  PT (By licensed PT), OT (By licensed OT)     PT Frequency: 5 OT Frequency: 5            Contractures Contractures Info: Not present    Additional  Factors Info  Code Status, Allergies Code Status Info: Full Code  Allergies Info: Ezetimibe, Fenofibrate Micronized, Statins, Sulfamethoxazole-trimethoprim           Current Medications (08/21/2017):  This is the current hospital active medication list Current Facility-Administered Medications  Medication Dose Route Frequency Provider Last Rate Last Dose  . 0.9 % NaCl with KCl 20 mEq/ L  infusion   Intravenous Continuous Sudini, Alveta Heimlich, MD 100 mL/hr at 08/21/17 1014    . acetaminophen (TYLENOL) tablet 650 mg  650 mg Oral Q6H PRN Hillary Bow, MD   650 mg at 08/21/17 1014   Or  . acetaminophen (TYLENOL) suppository 650 mg  650 mg Rectal Q6H PRN Sudini, Alveta Heimlich, MD      . acidophilus (RISAQUAD) capsule 1 capsule  1 capsule Oral BID Hillary Bow, MD   1 capsule at 08/21/17 1014  . albuterol (PROVENTIL) (2.5 MG/3ML) 0.083% nebulizer solution 2.5 mg  2.5 mg Nebulization Q2H PRN Sudini, Alveta Heimlich, MD      . aspirin EC tablet 81 mg  81 mg Oral Daily Hillary Bow, MD   81 mg at 08/21/17 1014  . enoxaparin (LOVENOX) injection 30 mg  30 mg Subcutaneous Q24H Hillary Bow, MD   30 mg at 08/20/17 2258  . hydroxychloroquine (PLAQUENIL) tablet 200 mg  200 mg Oral Micheline Maze, Sona, MD      . latanoprost (XALATAN) 0.005 % ophthalmic solution 1 drop  1 drop Both Eyes QHS Sudini, Alveta Heimlich, MD      . loperamide (IMODIUM)  capsule 2 mg  2 mg Oral Q6H PRN Hillary Bow, MD      . ondansetron (ZOFRAN) tablet 4 mg  4 mg Oral Q6H PRN Hillary Bow, MD       Or  . ondansetron (ZOFRAN) injection 4 mg  4 mg Intravenous Q6H PRN Sudini, Srikar, MD      . pantoprazole (PROTONIX) EC tablet 40 mg  40 mg Oral Daily Hillary Bow, MD   40 mg at 08/21/17 1014  . traMADol (ULTRAM) tablet 50 mg  50 mg Oral Q6H PRN Hillary Bow, MD         Discharge Medications: Please see discharge summary for a list of discharge medications.  Relevant Imaging Results:  Relevant Lab Results:   Additional Information SSN  [id number]  Annamaria Boots, [place]

## 2017-08-21 NOTE — Progress Notes (Signed)
Initial Nutrition Assessment  DOCUMENTATION CODES:   Severe malnutrition in context of chronic illness  INTERVENTION:  Provide Ensure Enlive po BID, each supplement provides 350 kcal and 20 grams of protein.  Provide Magic cup TID with meals, each supplement provides 290 kcal and 9 grams of protein.  NUTRITION DIAGNOSIS:   Severe Malnutrition related to chronic illness(renal cell carcinoma) as evidenced by severe fat depletion, severe muscle depletion.  GOAL:   Patient will meet greater than or equal to 90% of their needs  MONITOR:   PO intake, Supplement acceptance, Labs, Weight trends, Skin, I & O's  REASON FOR ASSESSMENT:   Malnutrition Screening Tool    ASSESSMENT:   84 year old female with PMHx of HTN, arthritis, CKD stage II, renal cell carcinoma s/p right nephrectomy currently on cabozantinib, hx breast cancer s/p left mastectomy, OP, GERD, PUD, glaucoma, hyponatremia, CHF admitted with acute rhabdomyolysis, AKI on CKD, acute on chronic diarrhea.   Met with patient at bedside. She is hard of hearing and a poor historian. No family members present at time of RD assessment. Patient only able to report that she does not have a good appetite and is eating small amounts at meals. She is unable to provide any further details on intake. Noted patient has been having diarrhea.   Unsure if current weight is accurate. Patient is on low bed which does not appear to be zeroed correctly. She was 102.1 lbs on 03/12/2017.   Medications reviewed and include: acidophilus, pantoprazole, NS with KCl 20 mEq/L at 100 mL/hr.  Labs reviewed: CBG 47-160, Creatinine 1.38.  Discussed with RN. Patient did not eat breakfast this morning but had some lunch today and fed herself.  NUTRITION - FOCUSED PHYSICAL EXAM:    Most Recent Value  Orbital Region  Severe depletion  Upper Arm Region  Severe depletion  Thoracic and Lumbar Region  Severe depletion  Buccal Region  Severe depletion  Temple  Region  Severe depletion  Clavicle Bone Region  Severe depletion  Clavicle and Acromion Bone Region  Severe depletion  Scapular Bone Region  Unable to assess  Dorsal Hand  Severe depletion  Patellar Region  Severe depletion  Anterior Thigh Region  Severe depletion  Posterior Calf Region  Severe depletion  Edema (RD Assessment)  None  Hair  Reviewed  Eyes  Reviewed  Mouth  Unable to assess  Skin  Reviewed  Nails  Reviewed     Diet Order:   Diet Order           Diet regular Room service appropriate? Yes; Fluid consistency: Thin  Diet effective now          EDUCATION NEEDS:   Not appropriate for education at this time  Skin:  Skin Assessment: Skin Integrity Issues:(MSAD to perineum)  Last BM:  08/21/2017 - medium type 7  Height:   Ht Readings from Last 1 Encounters:  08/20/17 _0  (1.549 m)    Weight:   Wt Readings from Last 1 Encounters:  08/20/17 102 lb (46.3 kg)    Ideal Body Weight:  47.7 kg  BMI:  Body mass index is 19.27 kg/m.  Estimated Nutritional Needs:   Kcal:  1200-1400  Protein:  60-70 grams  Fluid:  1.2-1.4 L/day  Willey Blade, MS, RD, LDN Office: [phone] Pager: [phone] After Hours/Weekend Pager: [phone]

## 2017-08-21 NOTE — Clinical Social Work Placement (Signed)
   CLINICAL SOCIAL WORK PLACEMENT  NOTE  Date:  08/21/2017  Patient Details  Name: JOYCE HEITMAN MRN: [id number] Date of Birth: 1931/02/04  Clinical Social Work is seeking post-discharge placement for this patient at the [place] level of care (*CSW will initial, date and re-position this form in  chart as items are completed):  Yes   Patient/family provided with [place] Work Department's list of facilities offering this level of care within the geographic area requested by the patient (or if unable, by the patient's family).  Yes   Patient/family informed of their freedom to choose among providers that offer the needed level of care, that participate in Medicare, Medicaid or managed care program needed by the patient, have an available bed and are willing to accept the patient.  Yes   Patient/family informed of [facility]'s ownership interest in [facility] and [facility], as well as of the fact that they are under no obligation to receive care at these facilities.  PASRR submitted to EDS on 08/21/17     PASRR number received on       Existing PASRR number confirmed on 08/21/17     FL2 transmitted to all facilities in geographic area requested by pt/family on 08/21/17     FL2 transmitted to all facilities within larger geographic area on       Patient informed that his/her managed care company has contracts with or will negotiate with certain facilities, including the following:            Patient/family informed of bed offers received.  Patient chooses bed at       Physician recommends and patient chooses bed at      Patient to be transferred to   on  .  Patient to be transferred to facility by       Patient family notified on   of transfer.  Name of family member notified:        PHYSICIAN       Additional Comment:    _______________________________________________ Annamaria Boots, [place] 08/21/2017, 12:59 PM

## 2017-08-22 DIAGNOSIS — R627 Adult failure to thrive: Secondary | ICD-10-CM

## 2017-08-22 DIAGNOSIS — E43 Unspecified severe protein-calorie malnutrition: Secondary | ICD-10-CM

## 2017-08-22 LAB — GLUCOSE, CAPILLARY
GLUCOSE-CAPILLARY: 79 mg/dL (ref 70–99)
Glucose-Capillary: 58 mg/dL — ABNORMAL LOW (ref 70–99)
Glucose-Capillary: 70 mg/dL (ref 70–99)
Glucose-Capillary: 78 mg/dL (ref 70–99)

## 2017-08-22 LAB — CK: CK TOTAL: 4459 U/L — AB (ref 38–234)

## 2017-08-22 LAB — BASIC METABOLIC PANEL
ANION GAP: 4 — AB (ref 5–15)
BUN: 21 mg/dL (ref 8–23)
CALCIUM: 7.4 mg/dL — AB (ref 8.9–10.3)
CO2: 23 mmol/L (ref 22–32)
CREATININE: 1.35 mg/dL — AB (ref 0.44–1.00)
Chloride: 111 mmol/L (ref 98–111)
GFR calc Af Amer: 40 mL/min — ABNORMAL LOW (ref >60)
GFR calc non Af Amer: 35 mL/min — ABNORMAL LOW (ref >60)
GLUCOSE: 71 mg/dL (ref 70–99)
Potassium: 4.5 mmol/L (ref 3.5–5.1)
Sodium: 138 mmol/L (ref 135–145)

## 2017-08-22 MED ORDER — AMLODIPINE BESYLATE 10 MG PO TABS
10.0000 mg | ORAL_TABLET | Freq: Every day | ORAL | Status: DC
  Administered 2017-08-22: 13:00:00 10 mg via ORAL
  Filled 2017-08-22: qty 1

## 2017-08-22 MED ORDER — ENALAPRIL MALEATE 10 MG PO TABS
10.0000 mg | ORAL_TABLET | Freq: Two times a day (BID) | ORAL | Status: DC
  Administered 2017-08-22 (×2): 10 mg via ORAL
  Filled 2017-08-22 (×2): qty 1

## 2017-08-22 MED ORDER — CABOZANTINIB S-MALATE 40 MG PO TABS: 40.0000 mg | ORAL_TABLET | Freq: Every day | ORAL | 0 refills | Status: AC

## 2017-08-22 MED ORDER — ENSURE ENLIVE PO LIQD: 237.0000 mL | Freq: Two times a day (BID) | ORAL | 12 refills | Status: AC

## 2017-08-22 MED ORDER — CALCIUM CARBONATE-VITAMIN D 500-200 MG-UNIT PO TABS
1.0000 | ORAL_TABLET | Freq: Two times a day (BID) | ORAL | Status: DC
  Administered 2017-08-22 (×2): 1 via ORAL
  Filled 2017-08-22 (×2): qty 1

## 2017-08-22 NOTE — Progress Notes (Signed)
PT FOR DISCHARGE TO [facility] VIA EMS THIS PM. ALERT. NO RESP DISTRESS.  VSS. PRN EARLIER FOR  C/O DISCOMFORT AT BOTTOM FROM LOOSE STOOLS AND  SHEER. IMODIUM GIVEN.  PT Margaret Matthews . REPORT GIVEN TO TINA LEGRANDE LPN AT FACILITY.

## 2017-08-22 NOTE — Discharge Summary (Addendum)
[facility] [place] NAME: Margaret Matthews    MR#:  [id number]  DATE OF BIRTH:  March 22, 1930  DATE OF ADMISSION:  08/20/2017 ADMITTING PHYSICIAN: Hillary Bow, MD  DATE OF DISCHARGE: 08/21/2017  PRIMARY CARE PHYSICIAN: Dion Body, MD    ADMISSION DIAGNOSIS:  Failure to thrive in adult [R62.7] Diarrhea, unspecified type [R19.7]  DISCHARGE DIAGNOSIS:  Acute on CKD stage II Acute rhabdomyolysis H/o renal cell carcinoma on oral chemo Generalized weakness with FTT and deconditioning Severe malnutrition Severe malnutriti SECONDARY DIAGNOSIS:   Past Medical History:  Diagnosis Date  . Anemia   . Arthritis   . Breast cancer ([place])   . CHF (congestive heart failure) ([place])   . CKD (chronic kidney disease) stage 2, GFR 60-89 ml/min    baseline creatinine 1.5  . GERD (gastroesophageal reflux disease)   . Glaucoma   . History of nephrectomy    patient reports only one kidney  . Hypertension   . Hyponatremia   . Osteopenia   . PUD (peptic ulcer disease)   . Renal cell carcinoma [facility])     HOSPITAL COURSE:   [name] a84 y.o.femalewith a known history of hypertension, chronic diarrhea, renal cell carcinoma presents to the emergency room complaining of worsening weakness and loss of appetite. Patient here in the emergency room was found to have troponin 0 0.24 and elevated CK of 4500.   *Acute rhabdomyolysis with CK of 4900--4400. Likely due to dehydration. Could be from chemotherapy.  -received IV hydration. Held statin but now resume since labs improving. Repeat CK level trending down Troponin 0 0.24 is likely due to rhabdomyolysis. No chest pain or shortness of breath. EKG shows nothing acute. -remains in NSR  *Acute kidney injury secondary to dehydration from decreased oral intake, ongoing diarrhea and rhabdomyolysis. recieved IV fluids. Monitor input and output.  -creat 1.54--1.38--1.35 -baseline  creat 1.0 (07/2017) -feels better today  *Acute on chronic diarrhea  -GI PCR negative -decreased frequency  *Mild elevation of AST and ALT most likely from rhabdomyolysis.  -trending down -resumed statins  *severe malnutrition -seen by Dietitian. Follow recommendations Pt's sugars a bit low but stable No s/o hypoglycemia noted  *Renal cell carcinoma. Patient is on cabozantinib.Follows with [facility] oncology  *Hypertension. resume now  *DVT prophylaxis with Lovenox  Overall deconditioned and very weak. Son has been caring for her for several years but lately has declined and now improving slowly.  Son just approached me and said he wants his mother to go to rehab. Palliative care to follow her at rehab. CONSULTS OBTAINED:    DRUG ALLERGIES:   Allergies  Allergen Reactions  . Ezetimibe Other (See Comments)    ELEV LFTS  . Fenofibrate Micronized Other (See Comments)    ELEV LFTS  . Statins Other (See Comments)    Reaction: Pt says "it was showing up in her liver."   . Sulfamethoxazole-Trimethoprim Diarrhea    DISCHARGE MEDICATIONS:   Allergies as of 82/18/2019      Reactions   Ezetimibe Other (See Comments)   ELEV LFTS   Fenofibrate Micronized Other (See Comments)   ELEV LFTS   Statins Other (See Comments)   Reaction: Pt says "it was showing up in her liver."    Sulfamethoxazole-trimethoprim Diarrhea      Medication List    STOP taking these medications   dorzolamide 2 % ophthalmic solution Commonly known as:  TRUSOPT     TAKE these medications  acetaminophen 500 MG tablet Commonly known as:  TYLENOL Take 500 mg by mouth every 8 (eight) hours as needed for fever.   acidophilus Caps capsule Take 1 capsule by mouth 2 (two) times daily.   amLODipine 10 MG tablet Commonly known as:  NORVASC Take 1 tablet by mouth daily.   aspirin EC 81 MG tablet Take 81 mg by mouth daily.   cabozantinib 40 MG tablet Commonly known as:   CABOMETYX Take 1 tablet (40 mg total) by mouth daily. Take on an empty stomach, 1 hour before or 2 hours after meals.   CALCIUM 600+D PLUS MINERALS PO Take 1 tablet by mouth 2 (two) times daily.   CRANBERRY PO Take 1 capsule by mouth every evening.   enalapril 10 MG tablet Commonly known as:  VASOTEC Take 10 mg by mouth 2 (two) times daily.   feeding supplement (ENSURE ENLIVE) Liqd Take 237 mLs by mouth 2 (two) times daily between meals.   hydroxychloroquine 200 MG tablet Commonly known as:  PLAQUENIL Take 200 mg by mouth every other day.   IRON-VITAMIN C PO Take 1 tablet by mouth every evening.   latanoprost 0.005 % ophthalmic solution Commonly known as:  XALATAN Place 1 drop into both eyes at bedtime.   pantoprazole 40 MG tablet Commonly known as:  PROTONIX Take 1 tablet by mouth daily.   rosuvastatin 40 MG tablet Commonly known as:  CRESTOR Take 40 mg by mouth daily.       If you experience worsening of your admission symptoms, develop shortness of breath, life threatening emergency, suicidal or homicidal thoughts you must seek medical attention immediately by calling 911 or calling your MD immediately  if symptoms less severe.  You Must read complete instructions/literature along with all the possible adverse reactions/side effects for all the Medicines you take and that have been prescribed to you. Take any new Medicines after you have completely understood and accept all the possible adverse reactions/side effects.   Please note  You were cared for by a hospitalist during your hospital stay. If you have any questions about your discharge medications or the care you received while you were in the hospital after you are discharged, you can call the unit and asked to speak with the hospitalist on call if the hospitalist that took care of you is not available. Once you are discharged, your primary care physician will handle any further medical issues. Please note that NO  REFILLS for any discharge medications will be authorized once you are discharged, as it is imperative that you return to your primary care physician (or establish a relationship with a primary care physician if you do not have one) for your aftercare needs so that they can reassess your need for medications and monitor your lab values. Today   SUBJECTIVE   Looks a bit perked up today  VITAL SIGNS:  Blood pressure (!) 148/78, pulse 90, temperature (!) 97.4 F (36.3 C), temperature source Oral, resp. rate 20, height 5\' 1"  (1.549 m), weight 46.3 kg (102 lb), SpO2 97 %.  I/O:    Intake/Output Summary (Last 24 hours) at 08/22/2017 1221 Last data filed at 08/22/2017 1011 Gross per 24 hour  Intake 1670 ml  Output -  Net 1670 ml    PHYSICAL EXAMINATION:  GENERAL:  84 y.o.-year-old patient lying in the bed with no acute distress. Thin cahcectic EYES: Pupils equal, round, reactive to light and accommodation. No scleral icterus. Extraocular muscles intact.  HEENT: Head atraumatic,  normocephalic. Oropharynx and nasopharynx clear.  NECK:  Supple, no jugular venous distention. No thyroid enlargement, no tenderness.  LUNGS: Normal breath sounds bilaterally, no wheezing, rales,rhonchi or crepitation. No use of accessory muscles of respiration.  CARDIOVASCULAR: S1, S2 normal. No murmurs, rubs, or gallops.  ABDOMEN: Soft, non-tender, non-distended. Bowel sounds present. No organomegaly or mass.  EXTREMITIES: No pedal edema, cyanosis, or clubbing.  NEUROLOGIC: Cranial nerves II through XII are intact. Muscle strength 5/5 in all extremities. Sensation intact. Gait not checked.  PSYCHIATRIC: The patient is alert and oriented x 3.  SKIN: No obvious rash, lesion, or ulcer.   DATA REVIEW:   CBC  Recent Labs  Lab 08/21/17 0430  WBC 6.2  HGB 9.9*  HCT 28.0*  PLT 117*    Chemistries  Recent Labs  Lab 08/20/17 1846 08/21/17 0430 08/22/17 0806  NA  --  140 138  K  --  4.0 4.5  CL  --  108 111   CO2  --  23 23  GLUCOSE  --  40* 71  BUN  --  23 21  CREATININE  --  1.38* 1.35*  CALCIUM  --  7.7* 7.4*  MG 1.6*  --   --   AST  --  253*  --   ALT  --  96*  --   ALKPHOS  --  75  --   BILITOT  --  1.0  --     Microbiology Results   Recent Results (from the past 240 hour(s))  Gastrointestinal Panel by PCR , Stool     Status: None   Collection Time: 08/21/17  4:03 AM  Result Value Ref Range Status   Campylobacter species NOT DETECTED NOT DETECTED Final   Plesimonas shigelloides NOT DETECTED NOT DETECTED Final   Salmonella species NOT DETECTED NOT DETECTED Final   Yersinia enterocolitica NOT DETECTED NOT DETECTED Final   Vibrio species NOT DETECTED NOT DETECTED Final   Vibrio cholerae NOT DETECTED NOT DETECTED Final   Enteroaggregative E coli (EAEC) NOT DETECTED NOT DETECTED Final   Enteropathogenic E coli (EPEC) NOT DETECTED NOT DETECTED Final   Enterotoxigenic E coli (ETEC) NOT DETECTED NOT DETECTED Final   Shiga like toxin producing E coli (STEC) NOT DETECTED NOT DETECTED Final   Shigella/Enteroinvasive E coli (EIEC) NOT DETECTED NOT DETECTED Final   Cryptosporidium NOT DETECTED NOT DETECTED Final   Cyclospora cayetanensis NOT DETECTED NOT DETECTED Final   Entamoeba histolytica NOT DETECTED NOT DETECTED Final   Giardia lamblia NOT DETECTED NOT DETECTED Final   Adenovirus F40/41 NOT DETECTED NOT DETECTED Final   Astrovirus NOT DETECTED NOT DETECTED Final   Norovirus GI/GII NOT DETECTED NOT DETECTED Final   Rotavirus A NOT DETECTED NOT DETECTED Final   Sapovirus (I, II, IV, and V) NOT DETECTED NOT DETECTED Final    Comment: Performed at [facility], [address] [place], [place] [address]    RADIOLOGY:  Dg Chest 1 View  Result Date: 08/20/2017 CLINICAL DATA:  Cough EXAM: CHEST  1 VIEW COMPARISON:  02/28/2017 FINDINGS: Mild cardiac enlargement. Negative for heart failure. Mild bibasilar atelectasis/infiltrate and small bilateral effusions. Significant  improvement in aeration since the prior study. IMPRESSION: Negative for heart failure. Mild bibasilar atelectasis and small effusions. Electronically Signed   By: Franchot Gallo M.D.   On: 08/20/2017 18:13     Management plans discussed with the patient, family and they are in agreement.  CODE STATUS:     Code Status Orders  (  From admission, onward)        Start     Ordered   08/20/17 2030  Full code  Continuous     08/20/17 2030    Code Status History    Date Active Date Inactive Code Status Order ID Comments User Context   02/28/2017 1151 03/04/2017 1528 DNR [id number]  Flora Lipps, MD Inpatient   02/28/2017 0801 02/28/2017 1151 Full Code [id number]  Harrie Foreman, MD Inpatient   05/30/2015 1135 06/03/2015 1420 Full Code [id number]  Epifanio Lesches, MD ED   05/08/2015 0700 05/12/2015 1855 Full Code [id number]  Mikael Spray, NP ED   07/20/2014 2230 07/23/2014 1703 Full Code [id number]  Gladstone Lighter, MD ED      TOTAL TIME TAKING CARE OF THIS PATIENT: *40* minutes.    Fritzi Mandes M.D on 08/22/2017 at 12:21 PM  Between 7am to 6pm - Pager - [phone] After 6pm go to [email or web address] - password [email or web address] [facility]  Office  [phone]  CC: Primary care physician; Dion Body, MD

## 2017-08-22 NOTE — Progress Notes (Signed)
PT Cancellation Note  Patient Details Name: Margaret Matthews MRN: [id number] DOB: 1930-06-13   Cancelled Treatment:     PT reviewed chart and attempted session this morning. Upon arrival pt very agitated and refuses to work with therapy. PT will attempt on next date that pt is medically appropriate.   Jaquane Boughner 08/22/2017, 10:37 AM

## 2017-08-22 NOTE — Clinical Social Work Note (Signed)
Patient is medically ready for discharge today. CSW notified patient's son Melayna Robarts [phone] that patient will be transported to [facility] today. CSW also notified [facility] admissions coordinator at [facility] that patient will discharge today. Claiborne Billings does have [facility] authorization. Patient will be transported by EMS. RN to call report and call for transport.   [place], [place]

## 2017-08-22 NOTE — Progress Notes (Signed)
Discharge orders to go to [facility]. Discharge papers given to EMS personnel.  EMS  transporting to  [facility]. All personal belongings given including her cane, chemo pills, hearing aids and clothing. No complaints of pain or discomfort. Pt assisted to bsc and voided prior to discharge. Clean Attends placed. Vital signs obtained and they were stable.

## 2017-08-22 NOTE — Clinical Social Work Note (Signed)
CSW spoke with patient's son Angeleah Labrake [phone] and gave bed offers. Son accepted bed offer from [facility] care. CSW notified Claiborne Billings, admissions coordinator at [facility] of bed acceptance. Claiborne Billings will begin [facility]. CSW will continue to follow for discharge planning.   [place], [place]

## 2017-08-22 NOTE — Progress Notes (Signed)
New referral for outpatient Palliative to follow at [facility] received from [place]. Plan is for discharge today. Patient information faxed to referral. Flo Shanks RN, BSN, [facility] [place], hospital Liaison [phone]

## 2017-08-22 NOTE — Progress Notes (Signed)
[name], female   DOB: 02-08-1930, 84 y.o.   MRN: [id number]  No show for family meeting, attempted to contact by phone without success.  Plan is for discharge today; back to SNF.  Recommend palliative to follow on discharge.  Discussed with Dr Posey Pronto  No charge   Wadie Lessen NP  Palliative Medicine Team Team Phone # [phone] Pager [phone]

## 2017-09-13 ENCOUNTER — Encounter: Payer: Self-pay | Admitting: Family

## 2017-09-13 ENCOUNTER — Ambulatory Visit: Payer: Medicare Other | Attending: Family | Admitting: Family

## 2017-09-13 VITALS — BP 118/82 | HR 88 | Resp 18 | Ht 63.0 in | Wt 98.0 lb

## 2017-09-13 DIAGNOSIS — Z7982 Long term (current) use of aspirin: Secondary | ICD-10-CM | POA: Insufficient documentation

## 2017-09-13 DIAGNOSIS — Z905 Acquired absence of kidney: Secondary | ICD-10-CM | POA: Insufficient documentation

## 2017-09-13 DIAGNOSIS — Z87891 Personal history of nicotine dependence: Secondary | ICD-10-CM | POA: Insufficient documentation

## 2017-09-13 DIAGNOSIS — E871 Hypo-osmolality and hyponatremia: Secondary | ICD-10-CM | POA: Insufficient documentation

## 2017-09-13 DIAGNOSIS — Z792 Long term (current) use of antibiotics: Secondary | ICD-10-CM | POA: Insufficient documentation

## 2017-09-13 DIAGNOSIS — I10 Essential (primary) hypertension: Secondary | ICD-10-CM

## 2017-09-13 DIAGNOSIS — Z85528 Personal history of other malignant neoplasm of kidney: Secondary | ICD-10-CM | POA: Diagnosis not present

## 2017-09-13 DIAGNOSIS — Z8249 Family history of ischemic heart disease and other diseases of the circulatory system: Secondary | ICD-10-CM | POA: Insufficient documentation

## 2017-09-13 DIAGNOSIS — D649 Anemia, unspecified: Secondary | ICD-10-CM | POA: Insufficient documentation

## 2017-09-13 DIAGNOSIS — I5032 Chronic diastolic (congestive) heart failure: Secondary | ICD-10-CM | POA: Diagnosis not present

## 2017-09-13 DIAGNOSIS — K219 Gastro-esophageal reflux disease without esophagitis: Secondary | ICD-10-CM | POA: Diagnosis not present

## 2017-09-13 DIAGNOSIS — Z9889 Other specified postprocedural states: Secondary | ICD-10-CM | POA: Diagnosis not present

## 2017-09-13 DIAGNOSIS — Z882 Allergy status to sulfonamides status: Secondary | ICD-10-CM | POA: Diagnosis not present

## 2017-09-13 DIAGNOSIS — N182 Chronic kidney disease, stage 2 (mild): Secondary | ICD-10-CM | POA: Diagnosis not present

## 2017-09-13 DIAGNOSIS — Z79899 Other long term (current) drug therapy: Secondary | ICD-10-CM | POA: Diagnosis not present

## 2017-09-13 DIAGNOSIS — I350 Nonrheumatic aortic (valve) stenosis: Secondary | ICD-10-CM | POA: Insufficient documentation

## 2017-09-13 DIAGNOSIS — Z888 Allergy status to other drugs, medicaments and biological substances status: Secondary | ICD-10-CM | POA: Diagnosis not present

## 2017-09-13 DIAGNOSIS — I130 Hypertensive heart and chronic kidney disease with heart failure and stage 1 through stage 4 chronic kidney disease, or unspecified chronic kidney disease: Secondary | ICD-10-CM | POA: Insufficient documentation

## 2017-09-13 DIAGNOSIS — Z09 Encounter for follow-up examination after completed treatment for conditions other than malignant neoplasm: Secondary | ICD-10-CM | POA: Diagnosis present

## 2017-09-13 DIAGNOSIS — Z853 Personal history of malignant neoplasm of breast: Secondary | ICD-10-CM | POA: Diagnosis not present

## 2017-09-13 DIAGNOSIS — Z9012 Acquired absence of left breast and nipple: Secondary | ICD-10-CM | POA: Insufficient documentation

## 2017-09-13 DIAGNOSIS — H409 Unspecified glaucoma: Secondary | ICD-10-CM | POA: Diagnosis not present

## 2017-09-13 DIAGNOSIS — E785 Hyperlipidemia, unspecified: Secondary | ICD-10-CM | POA: Insufficient documentation

## 2017-09-13 NOTE — Patient Instructions (Signed)
Continue weighing daily and call for an overnight weight gain of > 2 pounds or a weekly weight gain of >5 pounds. 

## 2017-09-13 NOTE — Progress Notes (Signed)
[name], female    DOB: [birth year], 84 y.o.   MRN: [id number]  HPI  Ms Sianez is an 84 y/o female with a history of severe aortic stenosis, HTN, hyperlipidemia, colitis, GERD, breast cancer, anemia, hyponatremia and chronic heart failure.   Echo report from 03/03/17 reviewed and showed an EF of 55-60% along with critical AS, mild MR, mild/moderate TR and severely elevated PA pressure of 73 mm Hg.   Admitted 08/20/17 due to acute rhabdomyolysis likely due to dehydration. Given IV fluids. Discharged to rehab after 2 days. Was in the ED 07/01/17 due to diarrhea where she was treated and released.   She presents today for a follow-up visit with complaint of moderate fatigue upon minimal exertion. She has associated shortness of breath, light-headedness and pressure sore on tailbone. She denies any weight gain, abdominal distention, palpitations, pedal edema or chest pain. She has severe hearing loss and continues to say "please help me". Son says she was just released from rehab. Will be having hospice home health coming.   Past Medical History:  Diagnosis Date  . Anemia   . Arthritis   . Breast cancer ([place])   . CHF (congestive heart failure) ([place])   . CKD (chronic kidney disease) stage 2, GFR 60-89 ml/min    baseline creatinine 1.5  . GERD (gastroesophageal reflux disease)   . Glaucoma   . History of nephrectomy    patient reports only one kidney  . Hypertension   . Hyponatremia   . Osteopenia   . PUD (peptic ulcer disease)   . Renal cell carcinoma [facility])    Past Surgical History:  Procedure Laterality Date  . BREAST SURGERY     Left mastectomy  . COLONOSCOPY    . HEMIARTHROPLASTY SHOULDER FRACTURE     Right shoulder  . HIP SURGERY     Left hip  . NEPHRECTOMY     Right nephrectomy   Family History  Problem Relation Age of Onset  . CAD Mother   . CVA Father   . CAD Sister    Social History   Tobacco Use  . Smoking status: Former Research scientist (life sciences)  . Smokeless  tobacco: Never Used  Substance Use Topics  . Alcohol use: No    Alcohol/week: 0.0 standard drinks   Allergies  Allergen Reactions  . Ezetimibe Other (See Comments)    ELEV LFTS  . Fenofibrate Micronized Other (See Comments)    ELEV LFTS  . Statins Other (See Comments)    Reaction: Pt says "it was showing up in her liver."   . Sulfamethoxazole-Trimethoprim Diarrhea   Past Medical History:  Diagnosis Date  . Anemia   . Arthritis   . Breast cancer ([place])   . CHF (congestive heart failure) ([place])   . CKD (chronic kidney disease) stage 2, GFR 60-89 ml/min    baseline creatinine 1.5  . GERD (gastroesophageal reflux disease)   . Glaucoma   . History of nephrectomy    patient reports only one kidney  . Hypertension   . Hyponatremia   . Osteopenia   . PUD (peptic ulcer disease)   . Renal cell carcinoma [facility])    Past Surgical History:  Procedure Laterality Date  . BREAST SURGERY     Left mastectomy  . COLONOSCOPY    . HEMIARTHROPLASTY SHOULDER FRACTURE     Right shoulder  . HIP SURGERY     Left hip  . NEPHRECTOMY     Right nephrectomy  Family History  Problem Relation Age of Onset  . CAD Mother   . CVA Father   . CAD Sister    Social History   Tobacco Use  . Smoking status: Former Research scientist (life sciences)  . Smokeless tobacco: Never Used  Substance Use Topics  . Alcohol use: No    Alcohol/week: 0.0 standard drinks   Allergies  Allergen Reactions  . Ezetimibe Other (See Comments)    ELEV LFTS  . Fenofibrate Micronized Other (See Comments)    ELEV LFTS  . Statins Other (See Comments)    Reaction: Pt says "it was showing up in her liver."   . Sulfamethoxazole-Trimethoprim Diarrhea   Prior to Admission medications   Medication Sig Start Date End Date Taking? Authorizing Provider  acetaminophen (TYLENOL) 500 MG tablet Take 500 mg by mouth every 8 (eight) hours as needed for fever.   Yes [provider]  acidophilus (RISAQUAD) CAPS capsule Take 1 capsule by mouth 2  (two) times daily.   Yes [provider]  amLODipine (NORVASC) 10 MG tablet Take 1 tablet by mouth daily.  04/13/15  Yes [provider]  aspirin EC 81 MG tablet Take 81 mg by mouth daily.   Yes [provider]  cabozantinib (CABOMETYX) 40 MG tablet Take 1 tablet (40 mg total) by mouth daily. Take on an empty stomach, 1 hour before or 2 hours after meals. 08/22/17  Yes Fritzi Mandes, MD  Calcium Carbonate-Vit D-Min (CALCIUM 600+D PLUS MINERALS PO) Take 1 tablet by mouth 2 (two) times daily.    Yes [provider]  CRANBERRY PO Take 1 capsule by mouth every evening.   Yes [provider]  enalapril (VASOTEC) 10 MG tablet Take 10 mg by mouth 2 (two) times daily.    Yes [provider]  feeding supplement, ENSURE ENLIVE, (ENSURE ENLIVE) LIQD Take 237 mLs by mouth 2 (two) times daily between meals. 08/22/17  Yes Fritzi Mandes, MD  hydroxychloroquine (PLAQUENIL) 200 MG tablet Take 200 mg by mouth every other day.    Yes [provider]  IRON-VITAMIN C PO Take 1 tablet by mouth every evening.   Yes [provider]  latanoprost (XALATAN) 0.005 % ophthalmic solution Place 1 drop into both eyes at bedtime. 05/12/15  Yes Fritzi Mandes, MD  pantoprazole (PROTONIX) 40 MG tablet Take 1 tablet by mouth daily. 02/17/15  Yes [provider]  rosuvastatin (CRESTOR) 40 MG tablet Take 40 mg by mouth daily. 06/23/17  Yes [provider]     Review of Systems  Constitutional: Positive for fatigue. Negative for appetite change.  HENT: Positive for hearing loss. Negative for congestion, postnasal drip and sore throat.   Eyes: Negative.   Respiratory: Positive for shortness of breath. Negative for chest tightness.   Cardiovascular: Negative for chest pain, palpitations and leg swelling.  Gastrointestinal: Negative for abdominal distention and abdominal pain.  Endocrine: Negative.   Genitourinary: Negative.   Musculoskeletal: Negative for  back pain and neck pain.  Skin: Positive for wound (pressure sore on tailbone).  Allergic/Immunologic: Negative.   Neurological: Positive for light-headedness (at times). Negative for dizziness.  Hematological: Negative for adenopathy. Does not bruise/bleed easily.  Psychiatric/Behavioral: Negative for dysphoric mood and sleep disturbance. The patient is not nervous/anxious.    Vitals:   09/13/17 1154  BP: 118/82  Pulse: 88  Resp: 18  SpO2: 96%  Weight: 98 lb (44.5 kg)  Height: 5\' 3"  (1.6 m)   Wt Readings from Last 3 Encounters:  09/13/17 98 lb (44.5 kg)  08/20/17 102 lb (46.3 kg)  07/01/17 102 lb (46.3 kg)   Lab Results  Component Value Date   CREATININE 1.35 (H) 08/22/2017   CREATININE 1.38 (H) 08/21/2017   CREATININE 1.54 (H) 08/20/2017    Physical Exam  Constitutional: She is oriented to person, place, and time. She appears well-developed and well-nourished.  HENT:  Head: Normocephalic and atraumatic.  Right Ear: Decreased hearing is noted.  Left Ear: Decreased hearing is noted.  Neck: Normal range of motion. Neck supple. No JVD present.  Cardiovascular: Normal rate and regular rhythm.  Pulmonary/Chest: Effort normal. She has no wheezes. She has no rales.  Abdominal: Soft. She exhibits no distension. There is no tenderness.  Musculoskeletal: She exhibits no edema or tenderness.  Neurological: She is alert and oriented to person, place, and time.  Skin: Skin is warm and dry.  Psychiatric: She has a normal mood and affect. Her behavior is normal. Thought content normal.  Nursing note and vitals reviewed.   Assessment & Plan:  1: Chronic heart failure with preserved ejection fraction- - NYHA class III - euvolemic - not weighing daily as she is having difficulty in standing for long periods of time - not adding salt and son has been trying to read food labels. Discussed the importance of closely following a 2000mg  sodium diet - reminded to drink between 40-60  ounces of fluid daily - will be having hospice home health coming - son says that she has a pressure sore on her tailbone which is causing her pain - BNP on 02/28/17 was 1073.0  2: HTN- - BP looks good today - saw PCP ([facility]) 06/07/17 - BMP from 08/22/17 reviewed and showed sodium 138, potassium 4.5 and GFR 35  Patient did not bring her medications nor a list. Each medication was verbally reviewed with the patient and she was encouraged to bring the bottles to every visit to confirm accuracy of list.  Due to patient's decline in health status, son opts to not make a return appointment at this time.

## 2017-10-06 DEATH — deceased

## 2020-04-11 IMAGING — CT CT ABD-PELV W/ CM
2 of 5 series · 15 of 46 positions shown, 17 images · IV contrast (APPLIED)
Comparison: 12/27/2004.

CLINICAL DATA: Diarrhea after starting antibiotics for pneumonia.
Recent right nephrectomy for renal cell carcinoma.

EXAM:
CT ABDOMEN AND PELVIS WITH CONTRAST
TECHNIQUE: Multidetector CT imaging of the abdomen and pelvis was performed
using the standard protocol following bolus administration of
intravenous contrast.
CONTRAST:  75mL [id number] IOPAMIDOL ([id number]) INJECTION 61%

[Series 2: axial st · axial · 0.67mm/px · z∈[-698,-323]mm · 12 of 87 slices shown, 14 images]
[im 6/87  soft-tissue]
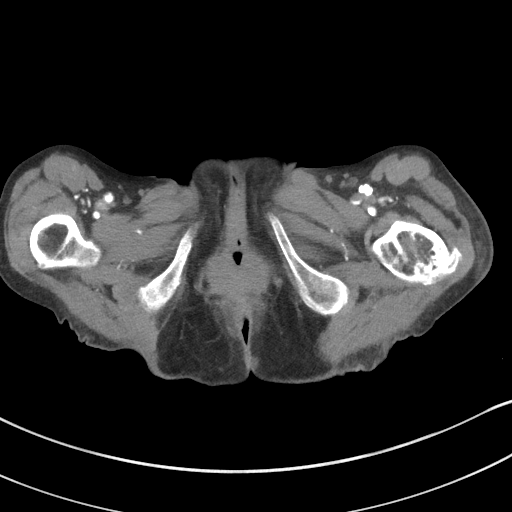
[im 6/87  bone]
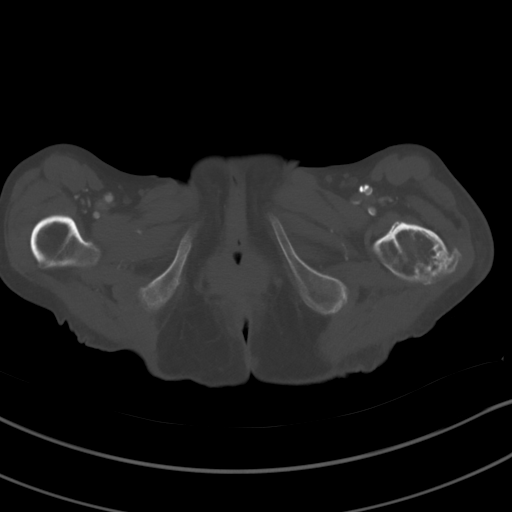
[im 16/87  soft-tissue]
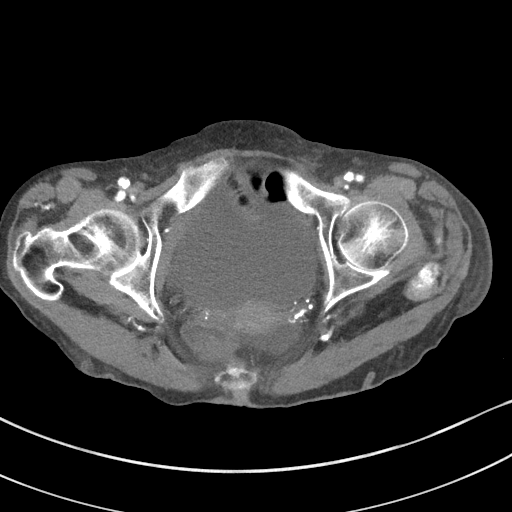
[im 21/87  soft-tissue]
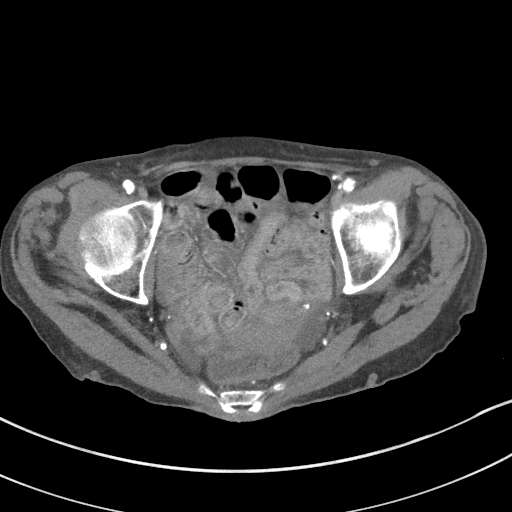
[im 26/87  soft-tissue]
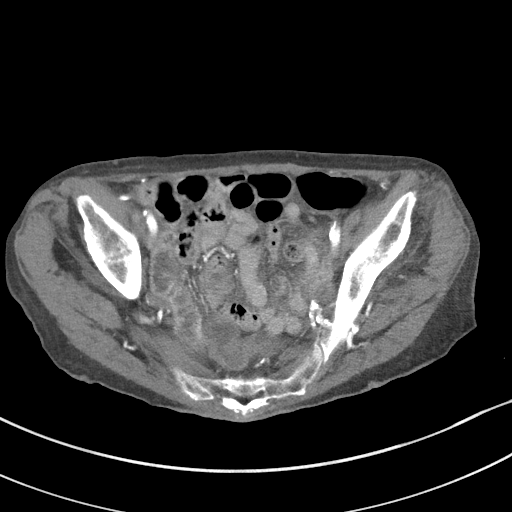
[im 36/87  soft-tissue]
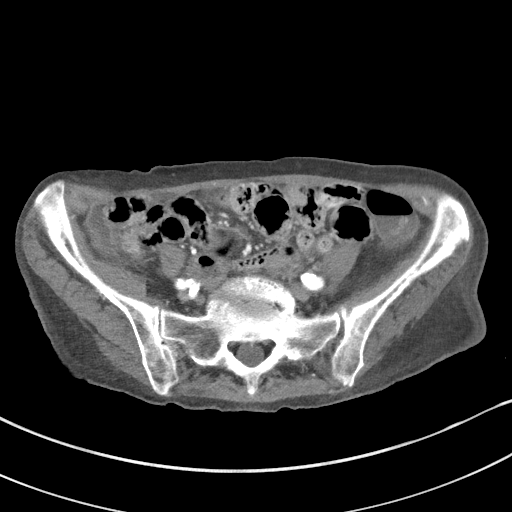
[im 41/87  soft-tissue]
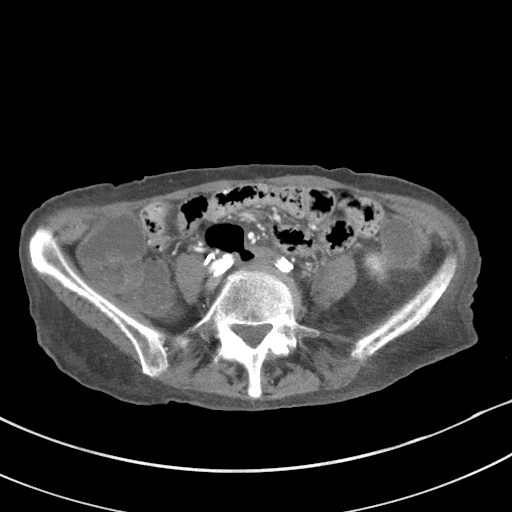
[im 46/87  soft-tissue]
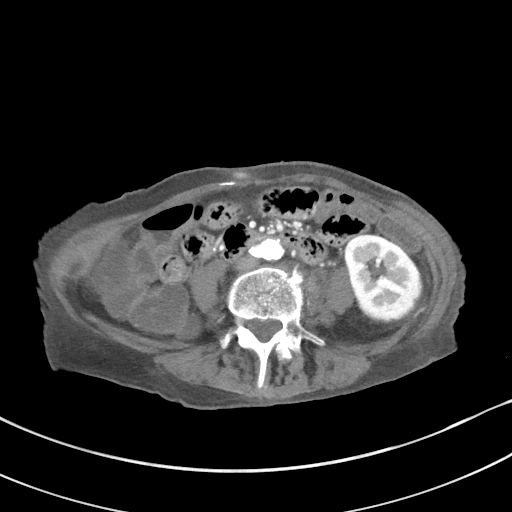
[im 56/87  soft-tissue]
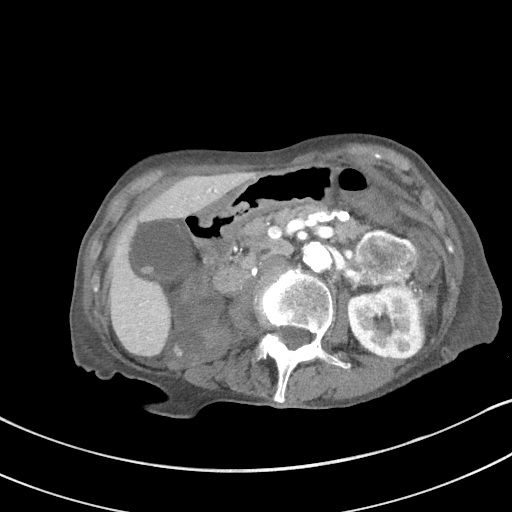
[im 61/87  soft-tissue]
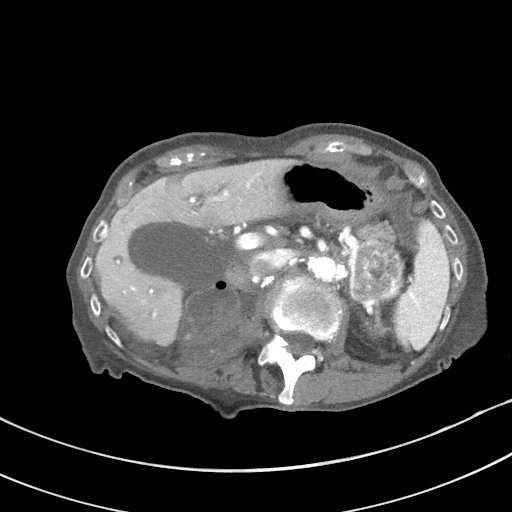
[im 61/87  bone]
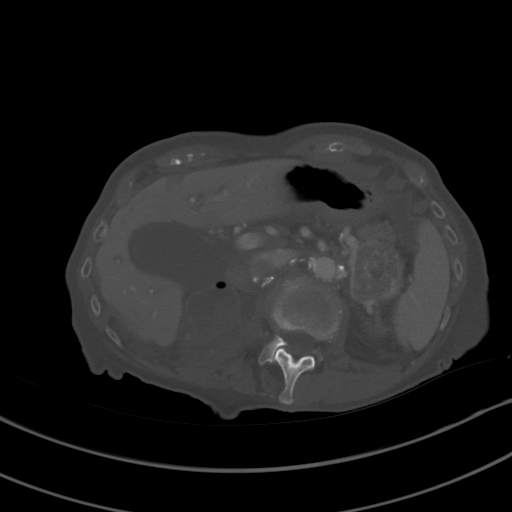
[im 66/87  soft-tissue]
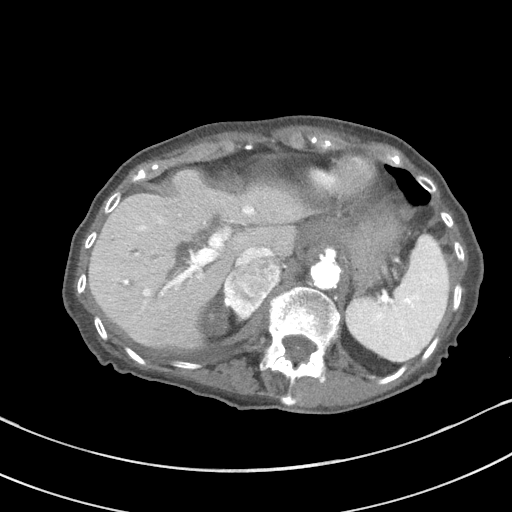
[im 76/87  soft-tissue]
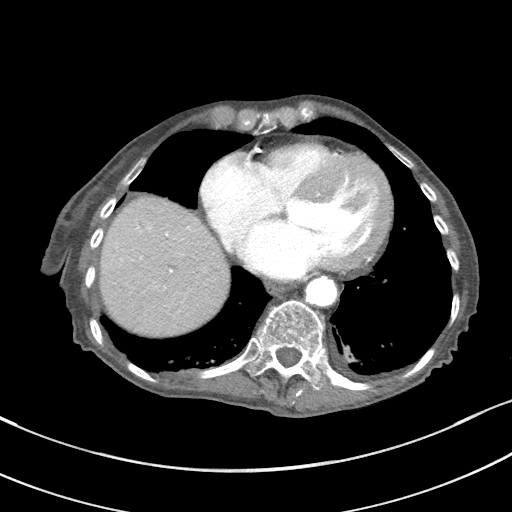
[im 81/87  soft-tissue]
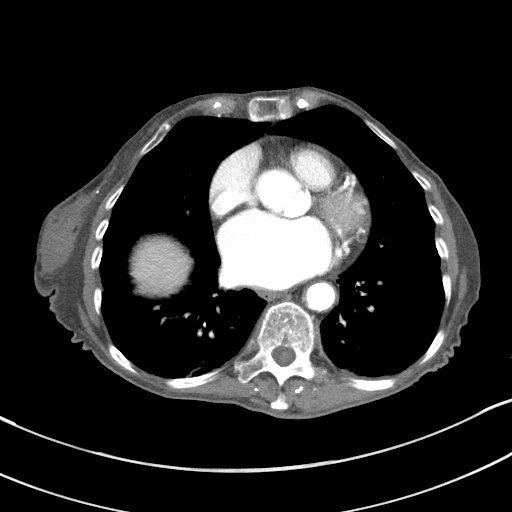

[Series 6: coronal st · coronal · 0.68mm/px · 3 of 79 slices shown]
[im 27/79  soft-tissue]
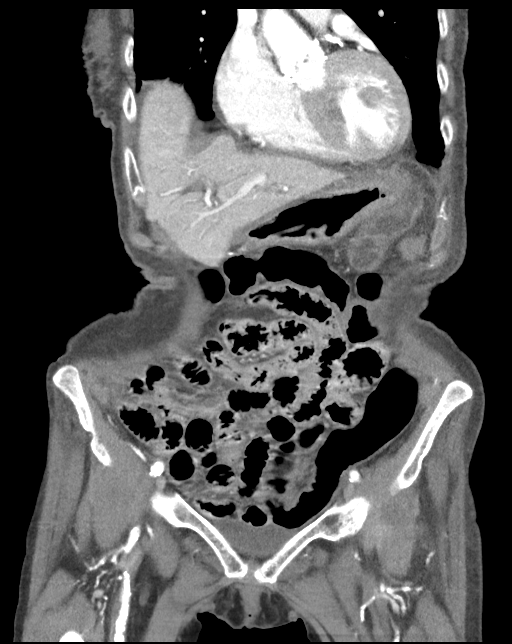
[im 35/79  soft-tissue]
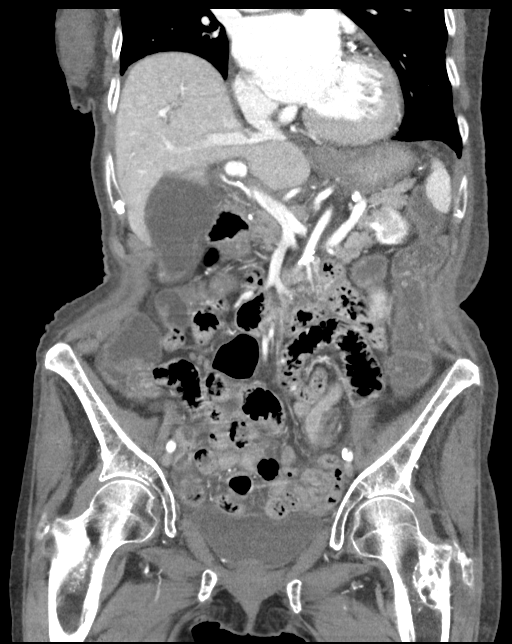
[im 44/79  soft-tissue]
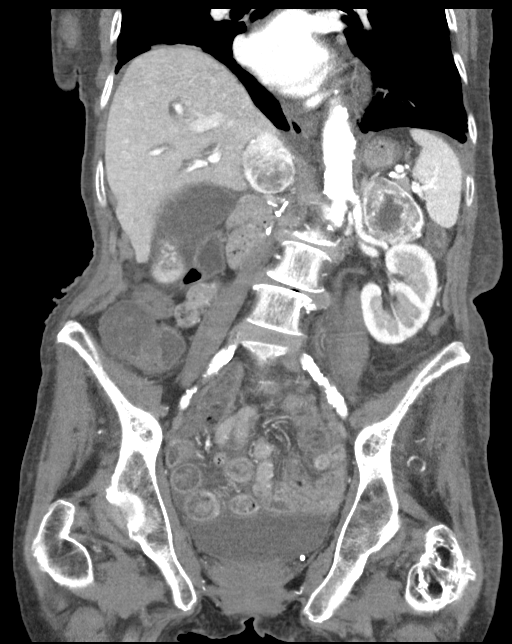

[15 of 46 positions shown; findings below may reference images not displayed]

FINDINGS: Lower chest: Atheromatous calcifications, including the coronary
arteries and aorta. Enlarged heart, primarily due to biatrial
enlargement. Small bilateral pleural effusions. Mild bilateral
dependent atelectasis.

Hepatobiliary: Dilated gallbladder containing multiple tiny stones
with 1 larger stone measuring 8 mm. Mild amount of pericholecystic
fluid. Mild intrahepatic biliary ductal dilatation. No visible
common duct stone.

Pancreas: Unremarkable. No pancreatic ductal dilatation or
surrounding inflammatory changes.

Spleen: Normal in size without focal abnormality.

Adrenals/Urinary Tract: Interval large bilateral heterogeneously
enhancing adrenal masses. The mass on the left measures 4.5 x 4.2 cm
on image number 31 series 2 in the mass on the right measures 4.7 x
2.9 cm on image number 22 series 2.

Surgically absent right kidney. Normal appearing left kidney and
urinary bladder. No left ureteral abnormality visualized.

Stomach/Bowel: Normal caliber fluid-filled colon. No evidence of
appendicitis. Unremarkable stomach and small bowel.

Vascular/Lymphatic: Atheromatous arterial calcifications without
aneurysm. No enlarged lymph nodes.

Reproductive: Uterus is small and poorly visualized. No adnexal
masses.

Other: Small amount of free peritoneal fluid.

Musculoskeletal: Mild lumbar and lower thoracic spine degenerative
changes. Approximately 10% T12 superior endplate compression
deformity with no acute fracture lines seen and minimal bony
retropulsion or spur formation. Circumscribed, mixed low density and
calcific density lesion in the left femoral greater trochanter,
measuring 4.6 x 3.3 cm on coronal image number 43. Mild to moderate
levoconvex thoracolumbar scoliosis. Status post left mastectomy.
IMPRESSION: 1. Fluid-filled normal caliber colon, compatible with the diarrhea
reported by history.
2. Interval large bilateral heterogeneously enhancing adrenal masses
compatible with adrenal metastases.
3. Small amount of free peritoneal fluid.
4. Cholelithiasis with dilation of the gallbladder and mild
pericholecystic fluid. These could represent chronic changes.
However, acute cholecystitis cannot be excluded.
5. 4.6 cm nonaggressive proximal left femoral lesion.
